# Patient Record
Sex: Female | Born: 1985 | Race: White | Hispanic: No | Marital: Married | Smoking: Current every day smoker
Health system: Southern US, Community
[De-identification: ages and names within clinical notes are randomized; demographics above are authoritative.]

## PROBLEM LIST (undated history)

## (undated) ENCOUNTER — Inpatient Hospital Stay (HOSPITAL_COMMUNITY): Payer: Self-pay

## (undated) ENCOUNTER — Emergency Department (HOSPITAL_COMMUNITY): Admission: EM | Payer: Self-pay | Source: Home / Self Care

## (undated) DIAGNOSIS — A6000 Herpesviral infection of urogenital system, unspecified: Secondary | ICD-10-CM

## (undated) DIAGNOSIS — N760 Acute vaginitis: Secondary | ICD-10-CM

## (undated) DIAGNOSIS — B379 Candidiasis, unspecified: Secondary | ICD-10-CM

## (undated) DIAGNOSIS — B9689 Other specified bacterial agents as the cause of diseases classified elsewhere: Secondary | ICD-10-CM

## (undated) DIAGNOSIS — Z331 Pregnant state, incidental: Secondary | ICD-10-CM

## (undated) DIAGNOSIS — B977 Papillomavirus as the cause of diseases classified elsewhere: Secondary | ICD-10-CM

## (undated) HISTORY — DX: Other specified bacterial agents as the cause of diseases classified elsewhere: B96.89

## (undated) HISTORY — DX: Other specified bacterial agents as the cause of diseases classified elsewhere: N76.0

## (undated) HISTORY — PX: TUBAL LIGATION: SHX77

## (undated) HISTORY — DX: Candidiasis, unspecified: B37.9

## (undated) HISTORY — DX: Pregnant state, incidental: Z33.1

---
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
## 2004-03-18 ENCOUNTER — Emergency Department (HOSPITAL_COMMUNITY): Admission: EM | Admit: 2004-03-18 | Discharge: 2004-03-18 | Payer: Self-pay | Admitting: Emergency Medicine

## 2005-03-07 ENCOUNTER — Emergency Department (HOSPITAL_COMMUNITY): Admission: EM | Admit: 2005-03-07 | Discharge: 2005-03-07 | Payer: Self-pay | Admitting: Family Medicine

## 2006-07-03 ENCOUNTER — Emergency Department (HOSPITAL_COMMUNITY): Admission: EM | Admit: 2006-07-03 | Discharge: 2006-07-03 | Payer: Self-pay | Admitting: Emergency Medicine

## 2006-08-21 ENCOUNTER — Inpatient Hospital Stay (HOSPITAL_COMMUNITY): Admission: AD | Admit: 2006-08-21 | Discharge: 2006-08-21 | Payer: Self-pay | Admitting: Obstetrics & Gynecology

## 2006-10-14 ENCOUNTER — Emergency Department (HOSPITAL_COMMUNITY): Admission: EM | Admit: 2006-10-14 | Discharge: 2006-10-14 | Payer: Self-pay | Admitting: Emergency Medicine

## 2006-10-14 ENCOUNTER — Inpatient Hospital Stay (HOSPITAL_COMMUNITY): Admission: AD | Admit: 2006-10-14 | Discharge: 2006-10-14 | Payer: Self-pay | Admitting: Obstetrics and Gynecology

## 2006-10-27 ENCOUNTER — Emergency Department (HOSPITAL_COMMUNITY): Admission: EM | Admit: 2006-10-27 | Discharge: 2006-10-27 | Payer: Self-pay | Admitting: Family Medicine

## 2006-12-15 ENCOUNTER — Inpatient Hospital Stay (HOSPITAL_COMMUNITY): Admission: AD | Admit: 2006-12-15 | Discharge: 2006-12-15 | Payer: Self-pay | Admitting: Obstetrics and Gynecology

## 2007-01-08 ENCOUNTER — Inpatient Hospital Stay (HOSPITAL_COMMUNITY): Admission: AD | Admit: 2007-01-08 | Discharge: 2007-01-08 | Payer: Self-pay | Admitting: Obstetrics and Gynecology

## 2007-03-25 ENCOUNTER — Inpatient Hospital Stay (HOSPITAL_COMMUNITY): Admission: AD | Admit: 2007-03-25 | Discharge: 2007-03-28 | Payer: Self-pay | Admitting: Obstetrics and Gynecology

## 2007-03-25 ENCOUNTER — Encounter (INDEPENDENT_AMBULATORY_CARE_PROVIDER_SITE_OTHER): Payer: Self-pay | Admitting: Obstetrics and Gynecology

## 2007-04-28 ENCOUNTER — Inpatient Hospital Stay (HOSPITAL_COMMUNITY): Admission: AD | Admit: 2007-04-28 | Discharge: 2007-04-28 | Payer: Self-pay | Admitting: Obstetrics and Gynecology

## 2007-08-20 HISTORY — PX: TOOTH EXTRACTION: SUR596

## 2007-09-01 ENCOUNTER — Emergency Department (HOSPITAL_COMMUNITY): Admission: EM | Admit: 2007-09-01 | Discharge: 2007-09-01 | Payer: Self-pay | Admitting: Family Medicine

## 2007-10-03 ENCOUNTER — Emergency Department (HOSPITAL_COMMUNITY): Admission: EM | Admit: 2007-10-03 | Discharge: 2007-10-03 | Payer: Self-pay | Admitting: Emergency Medicine

## 2007-11-18 ENCOUNTER — Emergency Department (HOSPITAL_COMMUNITY): Admission: EM | Admit: 2007-11-18 | Discharge: 2007-11-18 | Payer: Self-pay | Admitting: Emergency Medicine

## 2008-11-24 ENCOUNTER — Emergency Department (HOSPITAL_COMMUNITY): Admission: EM | Admit: 2008-11-24 | Discharge: 2008-11-24 | Payer: Self-pay | Admitting: Emergency Medicine

## 2009-01-28 ENCOUNTER — Emergency Department (HOSPITAL_COMMUNITY): Admission: EM | Admit: 2009-01-28 | Discharge: 2009-01-28 | Payer: Self-pay | Admitting: Emergency Medicine

## 2009-03-22 ENCOUNTER — Emergency Department (HOSPITAL_COMMUNITY): Admission: EM | Admit: 2009-03-22 | Discharge: 2009-03-22 | Payer: Self-pay | Admitting: Emergency Medicine

## 2010-01-19 ENCOUNTER — Emergency Department (HOSPITAL_COMMUNITY): Admission: EM | Admit: 2010-01-19 | Discharge: 2010-01-19 | Payer: Self-pay | Admitting: Emergency Medicine

## 2010-01-31 ENCOUNTER — Emergency Department (HOSPITAL_COMMUNITY): Admission: EM | Admit: 2010-01-31 | Discharge: 2010-01-31 | Payer: Self-pay | Admitting: Emergency Medicine

## 2010-02-17 ENCOUNTER — Emergency Department (HOSPITAL_COMMUNITY): Admission: EM | Admit: 2010-02-17 | Discharge: 2010-02-17 | Payer: Self-pay | Admitting: Emergency Medicine

## 2010-04-23 ENCOUNTER — Emergency Department (HOSPITAL_COMMUNITY): Admission: EM | Admit: 2010-04-23 | Discharge: 2010-04-23 | Payer: Self-pay | Admitting: Emergency Medicine

## 2010-07-18 ENCOUNTER — Emergency Department (HOSPITAL_COMMUNITY)
Admission: EM | Admit: 2010-07-18 | Discharge: 2010-07-18 | Payer: Self-pay | Source: Home / Self Care | Admitting: Emergency Medicine

## 2010-07-20 ENCOUNTER — Emergency Department (HOSPITAL_COMMUNITY)
Admission: EM | Admit: 2010-07-20 | Discharge: 2010-07-21 | Payer: Self-pay | Source: Home / Self Care | Admitting: Emergency Medicine

## 2010-08-30 ENCOUNTER — Emergency Department (HOSPITAL_COMMUNITY)
Admission: EM | Admit: 2010-08-30 | Discharge: 2010-08-30 | Payer: Self-pay | Source: Home / Self Care | Admitting: Emergency Medicine

## 2010-10-12 ENCOUNTER — Emergency Department (HOSPITAL_COMMUNITY)
Admission: EM | Admit: 2010-10-12 | Discharge: 2010-10-13 | Disposition: A | Discharge status: Home / Self Care | Payer: Self-pay | Attending: Emergency Medicine | Admitting: Emergency Medicine

## 2010-10-12 DIAGNOSIS — B9689 Other specified bacterial agents as the cause of diseases classified elsewhere: Secondary | ICD-10-CM | POA: Insufficient documentation

## 2010-10-12 DIAGNOSIS — A499 Bacterial infection, unspecified: Secondary | ICD-10-CM | POA: Insufficient documentation

## 2010-10-12 DIAGNOSIS — N760 Acute vaginitis: Secondary | ICD-10-CM | POA: Insufficient documentation

## 2010-10-12 LAB — URINE MICROSCOPIC-ADD ON

## 2010-10-12 LAB — URINALYSIS, ROUTINE W REFLEX MICROSCOPIC
Bilirubin Urine: NEGATIVE
Specific Gravity, Urine: 1.025 (ref 1.005–1.030)
Urine Glucose, Fasting: NEGATIVE mg/dL
Urobilinogen, UA: 0.2 mg/dL (ref 0.0–1.0)

## 2010-10-12 LAB — WET PREP, GENITAL
Trich, Wet Prep: NONE SEEN
Yeast Wet Prep HPF POC: NONE SEEN

## 2010-10-15 LAB — GC/CHLAMYDIA PROBE AMP, GENITAL: GC Probe Amp, Genital: NEGATIVE

## 2010-10-29 LAB — URINALYSIS, ROUTINE W REFLEX MICROSCOPIC
Bilirubin Urine: NEGATIVE
Glucose, UA: NEGATIVE mg/dL
Hgb urine dipstick: NEGATIVE
Specific Gravity, Urine: 1.011 (ref 1.005–1.030)
Urobilinogen, UA: 0.2 mg/dL (ref 0.0–1.0)
pH: 7.5 (ref 5.0–8.0)

## 2010-10-29 LAB — POCT PREGNANCY, URINE: Preg Test, Ur: NEGATIVE

## 2010-10-30 LAB — URINALYSIS, ROUTINE W REFLEX MICROSCOPIC
Bilirubin Urine: NEGATIVE
Glucose, UA: NEGATIVE mg/dL
Ketones, ur: NEGATIVE mg/dL
Nitrite: NEGATIVE
Protein, ur: NEGATIVE mg/dL
Specific Gravity, Urine: 1.015 (ref 1.005–1.030)
Urobilinogen, UA: 0.2 mg/dL (ref 0.0–1.0)
pH: 8 (ref 5.0–8.0)

## 2010-10-30 LAB — WET PREP, GENITAL
Clue Cells Wet Prep HPF POC: NONE SEEN
Trich, Wet Prep: NONE SEEN
Yeast Wet Prep HPF POC: NONE SEEN

## 2010-10-30 LAB — URINE MICROSCOPIC-ADD ON

## 2010-10-30 LAB — URINE CULTURE: Colony Count: >=100000

## 2010-11-01 LAB — URINALYSIS, ROUTINE W REFLEX MICROSCOPIC
Bilirubin Urine: NEGATIVE
Glucose, UA: NEGATIVE mg/dL
Protein, ur: NEGATIVE mg/dL
Specific Gravity, Urine: >1.03 — ABNORMAL HIGH (ref 1.005–1.030)
Urobilinogen, UA: 0.2 mg/dL (ref 0.0–1.0)

## 2010-11-01 LAB — POCT PREGNANCY, URINE: Preg Test, Ur: NEGATIVE

## 2010-11-01 LAB — URINE MICROSCOPIC-ADD ON

## 2010-11-01 LAB — WET PREP, GENITAL: Yeast Wet Prep HPF POC: NONE SEEN

## 2010-11-01 LAB — GC/CHLAMYDIA PROBE AMP, GENITAL: GC Probe Amp, Genital: NEGATIVE

## 2010-11-28 LAB — RAPID STREP SCREEN (MED CTR MEBANE ONLY): Streptococcus, Group A Screen (Direct): NEGATIVE

## 2010-12-01 ENCOUNTER — Emergency Department (HOSPITAL_COMMUNITY)
Admission: EM | Admit: 2010-12-01 | Discharge: 2010-12-01 | Disposition: A | Discharge status: Home / Self Care | Payer: Self-pay | Attending: Emergency Medicine | Admitting: Emergency Medicine

## 2010-12-01 DIAGNOSIS — R300 Dysuria: Secondary | ICD-10-CM | POA: Insufficient documentation

## 2010-12-01 LAB — URINALYSIS, ROUTINE W REFLEX MICROSCOPIC
Ketones, ur: 15 mg/dL — AB
Leukocytes, UA: NEGATIVE
Nitrite: NEGATIVE
Protein, ur: NEGATIVE mg/dL
Urobilinogen, UA: 0.2 mg/dL (ref 0.0–1.0)

## 2010-12-01 LAB — POCT PREGNANCY, URINE: Preg Test, Ur: NEGATIVE

## 2010-12-01 LAB — URINE MICROSCOPIC-ADD ON

## 2011-01-01 NOTE — Discharge Summary (Signed)
Sophia Garcia               ACCOUNT NO.:  [id number]   MEDICAL RECORD NO.:  [id number]          PATIENT TYPE:  INP   LOCATION:  9131                          FACILITY:  [facility]   PHYSICIAN:  Hal Morales, M.D.DATE OF BIRTH:  [birth year]   DATE OF ADMISSION:  03/25/2007  DATE OF DISCHARGE:  03/28/2007                               DISCHARGE SUMMARY   ADMISSION DIAGNOSES:  1. Intrauterine pregnancy at 38-4/7 weeks.  2. Early labor with positive rupture of membranes.  3. Negative Group B Strep.   DISCHARGE DIAGNOSES:  1. Intrauterine pregnancy at term.  2. Nonreassuring fetal heart rate.   PROCEDURES:  1. Primary low transverse cesarean section.  2. Epidural anesthesia.   HOSPITAL COURSE:  [name] is a 25 year old gravida 1, para 0, at 74-  4/7 weeks who presented with rupture of membranes at approximately 2:00  a.m. on the morning of March 25, 2007, and irregular uterine  contractions were noted.  Her pregnancy had been remarkable for:  1. Group B strep negative.  2. Latex allergy with no anaphylaxis.  3. Smoker.  4. First trimester marijuana use.  5. Late care.  6. Questionable nervous ticks.   HOSPITAL COURSE:  On admission cervix was fingertip, 50%, vertex at  minus two station.  She was leaking clear fluid. She was having  contractions every 5-6 minutes.  She was admitted here and contractions  began to be somewhat irregular. By the morning of August 6, she had been  started on Pitocin for minimal contraction change. Approximately 2:00  p.m. she was 2 cm, 90%, vertex at minus one station.  Fetal heart rate  was overall reassuring.  There were some mild variables noted.  Epidural  was placed.  By 5 p.m. the patient was having more pain with  contractions.  She had a deceleration down to 70s for 6 minutes with  recovery to 140 and then decelerations again to 115, repetitive late  decelerations occurred subsequent to that. There was positive short term  variability noted throughout. The cervix was 4, 90% vertex at -1. Dr.  Normand Sloop was caring for the patient at that time and despite all usual  measures the patient continued to have decelerations. C-section was  recommended and the patient family were in agreement.   The patient was taken to the operating room where a primary low  transverse cesarean section was performed for a viable female weight 5  pounds 10 ounces.  Apgars were 9 and 10.  Arterial and venous pH were  within normal limits.  The patient was taken to recovery in good  condition.  Infant was taken to the full-term nursery.  By postop day #1  patient is doing well.  Hemoglobin was 10.3, white blood cell count  11.9, platelet count 254.  She was on a PCA, this was discontinued that  day and Foley was also discontinued.  She did have some rhonchi noted in  her lungs due to status as a smoker.  She was encouraged to ambulate. By  the next day lungs were clear. Fundus was firm.  Incision  was clean, dry  and intact.  The patient was bottle feeding. Rest of hospital course was  uncomplicated.  By postop day #3 she was doing well.  She was requesting  discharge.  She was planning [place] for birth control.  Her physical  exam was within normal limits.  Her incision was clean, dry and intact  with Steri-Strips in place.  She was deemed to receive full benefit of  hospital stay and was discharged home.   DISCHARGE CONDITION:  Stable.   DISCHARGE INSTRUCTIONS:  Per [facility] handout.   DISCHARGE MEDICATIONS:  1. Motrin 600 mg p.o. q.6 hours p.r.n. pain.  2. Percocet one to two p.o. q.3-4 hours p.r.n. pain.   Discharge follow-up will occur in 5 weeks with plans made for cervical  cultures in preparation for [facility] IUD at the appropriate time.      Renaldo Reel Emilee Hero, C.N.M.      Hal Morales, M.D.  Electronically Signed    VLL/MEDQ  D:  03/28/2007  T:  03/29/2007  Job:  [id number]

## 2011-01-01 NOTE — Op Note (Signed)
NAME:  Sophia Garcia, Sophia Garcia               ACCOUNT NO.:  [id number]   MEDICAL RECORD NO.:  [id number]          PATIENT TYPE:  INP   LOCATION:  9131                          FACILITY:  [facility]   PHYSICIAN:  Naima A. Dillard, M.D. DATE OF BIRTH:  [birth year]   DATE OF PROCEDURE:  03/25/2007  DATE OF DISCHARGE:                               OPERATIVE REPORT   PREOPERATIVE DIAGNOSIS:  Pregnancy at term with nonreassuring fetal  heart tones.   POSTOPERATIVE DIAGNOSIS:  Pregnancy at term with nonreassuring fetal  heart tones.   PROCEDURE:  Primary low transverse cesarean section.   ESTIMATED BLOOD LOSS:  300 mL.   URINE OUTPUT:  300 mL.   IV FLUIDS:  1700 mL.   SURGEON:  Naima A. Normand Sloop, M.D.   ANESTHESIA:  Epidural.   Placenta was sent to pathology.   FINDINGS:  Female infant in vertex presentation with Apgars of 9 and 10,  with a weight of 5 pounds 10 ounces, and a venous pH of 7.36, arterial  pH of 7.37.  The patient had normal-appearing tubes and ovaries  bilaterally.  The patient went to recovery room in stable condition.   PROCEDURE IN DETAIL:  The patient was taken to the operating room where  epidural anesthesia was found to be adequate.  She was placed in dorsal  supine position with a left lateral tilt.  A Pfannenstiel skin incision  was made with the knife and carried down to the fascia.  The fascia was  incised in the midline, extended bilaterally.  Kocher x2 were placed on  the superior aspect of the fascia, which was dissected off the rectus  muscle both sharply and bluntly.  The inferior aspect of the fascia was  dissected in a similar fashion.  The rectus muscle was separated in  midline.  The peritoneum was identified and entered bluntly.  Bladder  blade was inserted.  Vesicouterine peritoneum was identified, tented up,  entered sharply and extended bilaterally.  Bladder blade was reinserted.  A primary low transverse uterine incision was made with the scalpel and  extended bluntly.  The infant was delivered without difficulty.  There  was clear fluid, no nuchal cord, no meconium.  Body was delivered  without difficulty.  Mouth and nares were suctioned.  Cord was clamped  and cut.  Arterial and venous blood gases were obtained.  Placenta was  manually delivered.  The uterus was cleared of all clot and debris.  Uterine incision was repaired with 0 Vicryl in a running locked fashion.  A second layer of 0 Vicryl was used to imbricate the uterus.  Irrigation  was done.  Hemostasis was assured.  The patient had normal-appearing  tubes and ovaries and abdominal anatomy.  All instruments were removed  from the abdomen.  The peritoneum was closed with 0 chromic.  The  muscles were irrigated and noted to be hemostatic.  The fascia was  closed with 0 Vicryl in a running fashion.  Subcutaneous tissue  was made hemostatic in any bleeding areas and then reapproximated with 2-  0 plain.  Skin was closed with  3-0 Monocryl in a subcuticular fashion.  Sponge, lap and needle counts were correct.  The patient went to the  recovery room in stable condition.      Naima A. Normand Sloop, M.D.  Electronically Signed     NAD/MEDQ  D:  03/25/2007  T:  03/26/2007  Job:  [id number]

## 2011-01-01 NOTE — H&P (Addendum)
NAMESHAGUANA, Sophia Garcia               ACCOUNT NO.:  [id number]   MEDICAL RECORD NO.:  [id number]          PATIENT TYPE:  MAT   LOCATION:  MATC                          [facility]:  [facility]   PHYSICIAN:  Hal Morales, M.D.DATE OF BIRTH:  10-Oct-1985   DATE OF ADMISSION:  03/25/2007  DATE OF DISCHARGE:                              HISTORY & PHYSICAL   Mrs. Sophia Garcia is a 25 year old, gravida 1, para 0, at 38-4/7 weeks who  presented complaining of spontaneous rupture of membranes at  approximately 2 a.m.  Clear fluid noted and a irregular contractions.  Group B strep culture was negative.   PREGNANCY ____ QA MARKER: 31 ____:       1. Latex allergy.   Dictation stopped here.      Renaldo Reel Emilee Hero, C.N.M.      Hal Morales, M.D.  Electronically Signed    VLL/MEDQ  D:  03/25/2007  T:  03/25/2007  Job:  [id number]
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
## 2011-01-04 NOTE — H&P (Signed)
NAME:  Sophia Garcia, Sophia Garcia                         ACCOUNT NO.:  [id number]   MEDICAL RECORD NO.:  [id number]                   PATIENT TYPE:  AMB   LOCATION:  DAY                                  FACILITY:  [facility]   PHYSICIAN:  J. Darreld Mclean, M.D.              DATE OF BIRTH:  09-15-1985   DATE OF ADMISSION:  DATE OF DISCHARGE:                                HISTORY & PHYSICAL   CHIEF COMPLAINT:  I got a needle in my foot.   HISTORY OF PRESENT ILLNESS:  The patient is a 25 year old female who broke a  sewing needle off between her first and second toes of the left foot,  approximately two weeks ago.  She was seen in the emergency room and then  seen in our office for the first time on August 25th.  At that time, she was  about six days from her injury.  She said she felt the needle in it when she  moved her foot.  X-rays at the hospital at [facility] taken on the 24th,  showed a needle in her foot, in the webspace, between the second toe and the  great toe area.  She had some marked erythema to the foot.  We put her on IV  Rocephin until Tuesday.  We saw her Wednesday.  We asked her to come back on  Friday.  We saw her back on Friday the 27th.  At that time, the erythema was  down significantly.  She still continued on the IV Rocephin.  Originally  told her we would have to wait to make sure the cellulitis cleared up before  we could do the surgery and now we plan to do the surgery on Tuesday, 31st  of August.  The patient's white count was only 10,700, with 71 neutrophils  when she came in.  She has been using crutches, staying off of it and doing  well.   PAST MEDICAL HISTORY:  Negative.  No heart disease, lung disease, kidney  disease, stroke, paralysis, weakness, hypertension, diabetes, TB or  rheumatic fever, cancer, ulcers, circulatory problems.   ALLERGIES:  Denies any allergies.   MEDICATIONS:  Hydrocodone, ibuprofen, Rocephin, Ancef.   SOCIAL HISTORY:  She does smoke.   She does not use alcoholic beverage.  She  has finished school through the 9th grade.  She does not have a family  doctor.   PAST SURGICAL HISTORY:  She did have sutures in her arm last year, but no  major surgeries.  The injury happened in her home.   PHYSICAL EXAMINATION:  VITAL SIGNS:  Within normal limits.  HEENT:  Negative.  NECK:  Supple.  LUNGS:  Clear to P&A.  HEART:  Regular without murmur.  ABDOMEN:  Soft, tender, without masses.  EXTREMITIES:  Negative except for the foot on the left.  There is some  swelling there but no further erythema.  There  is evidence of a small  puncture wound at the bottom of the foot in the webspace between the second  and first metatarsals.  Neurovascular intact.  Neurologically intact.  Other  extremities within normal limits.  CNS:  Intact.  SKIN:  Intact.   IMPRESSION:  Foreign body left foot, distally in the webspace area between  the first and second metatarsals.  History of recent cellulitis.   PLAN:  Excision of foreign body.  Will do this under C-arm fluoroscopy.  We  may come from the top on the dorsum of the foot.  Depends on how it looks  and it is going to be an incision approximately an inch and a half to two  inches to find this.  Risks and problems were discussed.  She will need to  continue the antibiotics until she completes a ten day course.  She and her  family appeared to understand the procedure as outlined.   LABORATORY DATA:  Pending.                                                 Teola Bradley, M.D.    JWK/MEDQ  D:  04/18/2003  T:  04/18/2003  Job:  [id number]

## 2011-01-04 NOTE — Op Note (Signed)
NAME:  Sophia Garcia, Sophia Garcia                         ACCOUNT NO.:  [id number]   MEDICAL RECORD NO.:  [id number]                   PATIENT TYPE:  AMB   LOCATION:  DAY                                  FACILITY:  [facility]   PHYSICIAN:  J. Darreld Mclean, M.D.              DATE OF BIRTH:  14-May-1986   DATE OF PROCEDURE:  DATE OF DISCHARGE:                                 OPERATIVE REPORT   PREOPERATIVE DIAGNOSIS:  Foreign body (sewing needle) between the first and  second toe webspace, left foot, deep.   POSTOPERATIVE DIAGNOSIS:  Foreign body (sewing needle) between the first and  second toe webspace, left foot, deep.   PROCEDURE:  Removal of deep foreign body, left foot, between the first and  second toe webspace at first and second metatarsals.   ANESTHESIA:  General.   SURGEON:  J. Darreld Mclean, M.D.   ASSISTANT:  Candace Cruise, P.A.C.   TOURNIQUET TIME:  Please refer to anesthesia record.   DRAINS:  None.   DISPOSITION:  The patient went to recovery in good condition.   INDICATIONS FOR PROCEDURE:  The patient is a 25 year old who approximately  two weeks ago stuck a needle into her foot.  It broke off.  She delayed  going to the emergency room for five to six days.  When she got to the  emergency room, she already had some erythema.  She had cellulitis.  She was  begun on IV Rocephin.  I saw her in the office on the following day.  We  continued the IV antibiotics until the foot cleared up.  She is now in for  removal of the foreign body, the needle.  The risks and imponderables have  been discussed.  The family and the patient appear to understand and agree  to the procedure as outlined.   DESCRIPTION OF PROCEDURE:  The patient was placed supine on the operating  room table and general anesthesia was given.  A tourniquet was placed  deflated on the left upper thigh.  The patient's was prepped and draped in  the usual manner.  We reascertained that we were doing [name] and  doing  for a foreign body.  The left foot was elevated and wrapped  circumferentially with an Esmarch bandage.  The tourniquet was inflated to  250 mmHg.  The Esmarch bandage was removed.   The C-arm fluoroscopy unit was brought forward, and using three different  size gauge needles, these were placed in the area where we thought that the  foreign body was.  We were unable to triangulate and ascertain where this  was in the depth.  I decided to make an anterior approach.  An anterior  approach was made between the webspace between the first and second toe.  Careful dissection of the neurovascular bundles were identified and  protected.  We brought the C-arm unit in further and placed the  needles  again.  We were able to actually palpate the needle.  With gentle  dissection, the needle was identified deep, brought forward, and brought  out.  An x-ray was taken proving that the needle was removed, and there was  no evidence of any retained metal.  The area was irrigated copiously with  saline and then the wound reapproximated using 2-0 plain deep and  interrupted vertical mattress 3-0 nylon for the skin.  A sterile dressing  was applied.  A bulky dressing was applied.  The tourniquet was deflated.  Please see anesthesia record for tourniquet time.   The patient was given a prescription for Vicodin-ES for pain.  She has  crutches.  She is to stay off of it, elevate it.  I will see her in the  office in approximately 10 days to 2 weeks.  Any difficulties, she is to  contact me through the office or hospital beeper system.                                               Teola Bradley, M.D.    JWK/MEDQ  D:  04/19/2003  T:  04/19/2003  Job:  [id number]

## 2011-02-21 ENCOUNTER — Emergency Department (HOSPITAL_COMMUNITY): Payer: Self-pay

## 2011-02-21 ENCOUNTER — Emergency Department (HOSPITAL_COMMUNITY)
Admission: EM | Admit: 2011-02-21 | Discharge: 2011-02-21 | Disposition: A | Discharge status: Home / Self Care | Payer: Self-pay | Attending: Emergency Medicine | Admitting: Emergency Medicine

## 2011-02-21 DIAGNOSIS — S6390XA Sprain of unspecified part of unspecified wrist and hand, initial encounter: Secondary | ICD-10-CM | POA: Insufficient documentation

## 2011-02-21 DIAGNOSIS — F17200 Nicotine dependence, unspecified, uncomplicated: Secondary | ICD-10-CM | POA: Insufficient documentation

## 2011-02-21 DIAGNOSIS — Y92009 Unspecified place in unspecified non-institutional (private) residence as the place of occurrence of the external cause: Secondary | ICD-10-CM | POA: Insufficient documentation

## 2011-02-21 DIAGNOSIS — R296 Repeated falls: Secondary | ICD-10-CM | POA: Insufficient documentation

## 2011-05-10 LAB — POCT URINALYSIS DIP (DEVICE)
Nitrite: NEGATIVE
Protein, ur: 100 — AB
Urobilinogen, UA: 1
pH: 6

## 2011-05-10 LAB — I-STAT 8, (EC8 V) (CONVERTED LAB)
Acid-base deficit: 1
Bicarbonate: 21.9
Glucose, Bld: 98
Potassium: 3.3 — ABNORMAL LOW
TCO2: 23
pCO2, Ven: 29.3 — ABNORMAL LOW
pH, Ven: 7.481 — ABNORMAL HIGH

## 2011-05-10 LAB — POCT PREGNANCY, URINE
Operator id: 239701
Preg Test, Ur: NEGATIVE

## 2011-05-14 LAB — WET PREP, GENITAL
Clue Cells Wet Prep HPF POC: NONE SEEN
Trich, Wet Prep: NONE SEEN
WBC, Wet Prep HPF POC: NONE SEEN
Yeast Wet Prep HPF POC: NONE SEEN

## 2011-05-14 LAB — POCT URINALYSIS DIP (DEVICE)
Bilirubin Urine: NEGATIVE
Hgb urine dipstick: NEGATIVE
Nitrite: NEGATIVE
Protein, ur: NEGATIVE
pH: 8.5 — ABNORMAL HIGH

## 2011-05-14 LAB — POCT PREGNANCY, URINE
Operator id: 247071
Preg Test, Ur: NEGATIVE

## 2011-06-03 LAB — CBC
Hemoglobin: 11.9 — ABNORMAL LOW
MCHC: 33.6
MCHC: 34.3
MCV: 101.5 — ABNORMAL HIGH
Platelets: 254
Platelets: 374
RBC: 2.96 — ABNORMAL LOW
RDW: 14.4 — ABNORMAL HIGH
WBC: 11.9 — ABNORMAL HIGH

## 2011-10-24 ENCOUNTER — Inpatient Hospital Stay (HOSPITAL_COMMUNITY)
Admission: AD | Admit: 2011-10-24 | Discharge: 2011-10-24 | Disposition: A | Discharge status: Home / Self Care | Payer: Self-pay | Source: Ambulatory Visit | Attending: Obstetrics & Gynecology | Admitting: Obstetrics & Gynecology

## 2011-10-24 ENCOUNTER — Encounter (HOSPITAL_COMMUNITY): Payer: Self-pay | Admitting: *Deleted

## 2011-10-24 DIAGNOSIS — Z3201 Encounter for pregnancy test, result positive: Secondary | ICD-10-CM | POA: Insufficient documentation

## 2011-10-24 LAB — POCT PREGNANCY, URINE: Preg Test, Ur: POSITIVE — AB

## 2011-10-24 NOTE — ED Notes (Signed)
Pt needs a verification letter to apply for pregancy medicaid; wishes to get a rx for nause;

## 2011-10-24 NOTE — Progress Notes (Signed)
Patient states she has done two home pregnancy tests that are positive. Needs a proof of pregnancy letter to apply for medicaid. Patient states she has nausea but has only vomited once. No pain or bleeding.

## 2011-10-24 NOTE — ED Provider Notes (Signed)
Sophia Garcia is a 26 y.o. female who presents to [facility] for pregnancy verification. She has had a little nausea in the am but denies any other problems.  ROS: As stated above.  PE: alert and oriented, no acute distress. Vital signs reviewed and are normal.   Results for orders placed during the hospital encounter of 10/24/11 (from the past 24 hour(s))  POCT PREGNANCY, URINE     Status: Abnormal   Collection Time   10/24/11 12:14 PM      Component Value Range   Preg Test, Ur POSITIVE (*) NEGATIVE     [place], NP 10/24/11 1313

## 2011-10-24 NOTE — Discharge Instructions (Signed)
    ________________________________________     To schedule your Maternity Eligibility Appointment, please call [phone].  When you arrive for your appointment you must bring the following items or information listed below.  Your appointment will be rescheduled if you do not have these items or are 15 minutes late. If currently receiving Medicaid, you MUST bring: 1. Medicaid Card 2. Social Security Card 3. Picture ID 4. Proof of Pregnancy 5. Verification of current address if the address on Medicaid card is incorrect "postmarked mail" If not receiving Medicaid, you MUST bring: 1. Social Security Card 2. Picture ID 3. Birth Certificate (if available) Passport or *Green Card 4. Proof of Pregnancy 5. Verification of current address "postmarked mail" for each income presented. 6. Verification of insurance coverage, if any 7. Check stubs from each employer for the previous month (if unable to present check stub  for each week, we will accept check stub for the first and last week ill the same month.) If you can't locate check stubs, you must bring a letter from the employer(s) and it must have the following information on letterhead, typed, in English: o name of company o company telephone number o how long been with the company, if less than one month o how much person earns per hour o how many hours per week work o the gross pay the person earned for the previous month If you are 27 years old or less, you do not have to bring proof of income unless you work or live with the father of the baby and at that time we will need proof of income from you and/or the father of the baby. Green Card recipients are eligible for Medicaid for Pregnant Women (MPW)    

## 2011-11-05 ENCOUNTER — Encounter (HOSPITAL_COMMUNITY): Payer: Self-pay | Admitting: *Deleted

## 2011-11-05 ENCOUNTER — Inpatient Hospital Stay (HOSPITAL_COMMUNITY)
Admission: AD | Admit: 2011-11-05 | Discharge: 2011-11-05 | Disposition: A | Discharge status: Home / Self Care | Payer: Medicaid Other | Source: Ambulatory Visit | Attending: Obstetrics & Gynecology | Admitting: Obstetrics & Gynecology

## 2011-11-05 DIAGNOSIS — O219 Vomiting of pregnancy, unspecified: Secondary | ICD-10-CM

## 2011-11-05 DIAGNOSIS — O210 Mild hyperemesis gravidarum: Secondary | ICD-10-CM | POA: Insufficient documentation

## 2011-11-05 LAB — URINALYSIS, ROUTINE W REFLEX MICROSCOPIC
Bilirubin Urine: NEGATIVE
Ketones, ur: NEGATIVE mg/dL
Leukocytes, UA: NEGATIVE
Nitrite: NEGATIVE
Urobilinogen, UA: 0.2 mg/dL (ref 0.0–1.0)

## 2011-11-05 MED ORDER — ONDANSETRON 8 MG PO TBDP
8.0000 mg | ORAL_TABLET | Freq: Once | ORAL | Status: AC
  Administered 2011-11-05: 8 mg via ORAL
  Filled 2011-11-05: qty 1

## 2011-11-05 MED ORDER — ONDANSETRON 8 MG PO TBDP: 8.0000 mg | ORAL_TABLET | Freq: Once | ORAL | Status: AC

## 2011-11-05 MED ORDER — FAMOTIDINE 20 MG PO TABS: 20.0000 mg | ORAL_TABLET | Freq: Two times a day (BID) | ORAL | Status: DC

## 2011-11-05 NOTE — Discharge Instructions (Signed)
Morning Sickness Morning sickness is when you feel sick to your stomach (nauseous) during pregnancy. You may feel sick to your stomach and throw up (vomit). You may feel sick in the morning, but you can feel this way any time of day. Some women feel very sick to their stomach and cannot stop throwing up (hyperemesis gravidarum). HOME CARE  Take multivitamins as told by your doctor. Taking multivitamins before getting pregnant can stop or lessen the harshness of morning sickness.   Eat dry toast or unsalted crackers before getting out of bed.   Eat 5 to 6 small meals a day.   Eat dry and bland foods like rice and baked potatoes.   Do not drink liquids with meals. Drink between meals.   Do not eat greasy, fatty, or spicy foods.   Have someone cook for you if the smell of food causes you to feel sick or throw up.   Do not take vitamins with iron, or as told by your doctor.   Eat protein when you need a snack (nuts, yogurt, cheese).   Eat unsweetened gelatins for dessert.   Wear a bracelet used for sea sickness (acupressure wristband).   Go to a doctor that puts thin needles into certain body points (acupuncture) to improve how you feel.   Do not smoke.   Use a humidifier to keep the air in your house free of odors.  GET HELP RIGHT AWAY IF:   You feel very sick to your stomach and cannot stop throwing up.   You pass out (faint).   You have a fever.   You need medicine to feel better.   You feel dizzy or lightheaded.   You are losing weight.   You need help knowing what to eat and what not to eat.  MAKE SURE YOU:   Understand these instructions.   Will watch your condition.   Will get help right away if you are not doing well or get worse.  Document Released: 09/12/2004 Document Revised: 07/25/2011 Document Reviewed: 11/02/2009 [facility] Patient Information 2012 [place], [place].B.R.A.T. Diet Your doctor has recommended the B.R.A.T. diet for you or your child until the  condition improves. This is often used to help control diarrhea and vomiting symptoms. If you or your child can tolerate clear liquids, you may have:  Bananas.   Rice.   Applesauce.   Toast (and other simple starches such as crackers, potatoes, noodles).  Be sure to avoid dairy products, meats, and fatty foods until symptoms are better. Fruit juices such as apple, grape, and prune juice can make diarrhea worse. Avoid these. Continue this diet for 2 days or as instructed by your caregiver. Document Released: 08/05/2005 Document Revised: 07/25/2011 Document Reviewed: 01/22/2007 [facility] Patient Information 2012 [place], [place].

## 2011-11-05 NOTE — MAU Provider Note (Signed)
Chief Complaint:  Nausea and Diarrhea    None     Sophia Garcia is  26 y.o. G2P1001.  Patient's last menstrual period was 09/13/2011.Marland Kitchen  Her pregnancy status is positive.  [redacted]w[redacted]d by LMP. She presents complaining of Nausea and Diarrhea . Onset is described as gradual and has been present for  1 week. Reports mainly dry heaving. 3 episodes of loose stool early today. Tolerating water and a banana sandwich only today. Denies abd pain, bleeding, dysuria, or back pain.   Obstetrical/Gynecological History: OB History    Grav Para Term Preterm Abortions TAB SAB Ect Mult Living   2 1 1       1       Past Medical History: History reviewed. No pertinent past medical history.  Past Surgical History: Past Surgical History  Procedure Date  . Cesarean section   . Tooth extraction   . Foot surgery     Family History: Family History  Problem Relation Age of Onset  . Diabetes Father   . Cancer Paternal Aunt   . Diabetes Paternal Aunt   . Diabetes Paternal Uncle   . Cancer Maternal Grandmother     Social History: History  Substance Use Topics  . Smoking status: Current Everyday Smoker -- 0.2 packs/day  . Smokeless tobacco: Not on file  . Alcohol Use: No    Allergies:  Allergies  Allergen Reactions  . Morphine And Related Rash    Prescriptions prior to admission  Medication Sig Dispense Refill  . Prenatal Vit-Fe Fumarate-FA (PRENATAL MULTIVITAMIN) TABS Take 1 tablet by mouth daily.        Review of Systems - Negative except what has been reviewed in the HPI  Physical Exam   Blood pressure 111/57, pulse 79, temperature 98.2 F (36.8 C), temperature source Oral, resp. rate 18, last menstrual period 09/13/2011.  General: General appearance - alert, well appearing, and in no distress, oriented to person, place, and time and normal appearing weight Mental status - alert, oriented to person, place, and time, normal mood, behavior, speech, dress, motor activity, and thought  processes, affect appropriate to mood Abdomen - soft, nontender, nondistended, no masses or organomegaly Focused Gynecological Exam: normal external genitalia, vulva, vagina, cervix, uterus and adnexa, examination not indicated  Labs: Recent Results (from the past 24 hour(s))  URINALYSIS, ROUTINE W REFLEX MICROSCOPIC   Collection Time   11/05/11  6:05 PM      Component Value Range   Color, Urine YELLOW  YELLOW    APPearance CLEAR  CLEAR    Specific Gravity, Urine 1.025  1.005 - 1.030    pH 6.0  5.0 - 8.0    Glucose, UA NEGATIVE  NEGATIVE (mg/dL)   Hgb urine dipstick NEGATIVE  NEGATIVE    Bilirubin Urine NEGATIVE  NEGATIVE    Ketones, ur NEGATIVE  NEGATIVE (mg/dL)   Protein, ur NEGATIVE  NEGATIVE (mg/dL)   Urobilinogen, UA 0.2  0.0 - 1.0 (mg/dL)   Nitrite NEGATIVE  NEGATIVE    Leukocytes, UA NEGATIVE  NEGATIVE     Imaging Studies:  Informal bedside US showed viable IUP with CRL measuring [redacted]w[redacted]d with cardiac activity. Images archive. Picture given to patient   Assessment: Nausea and Vomiting of pregnancy  Plan: Discharge home Rx send to pharmacy for Zofran and Pepcid BRAT diet Norovirus precautions reviewed. FU with [facility] for new OB appt as scheduled.  Fredricka Kohrs E. 11/05/2011,6:43 PM

## 2011-11-05 NOTE — MAU Note (Signed)
First nausea and then pt states she started having diarrhea started at about 1130. Pt states she has has 3 episodes of diarrhea

## 2011-11-05 NOTE — Progress Notes (Signed)
heartrate visualized

## 2011-11-20 ENCOUNTER — Encounter (HOSPITAL_COMMUNITY): Payer: Self-pay | Admitting: *Deleted

## 2011-11-20 ENCOUNTER — Inpatient Hospital Stay (HOSPITAL_COMMUNITY)
Admission: AD | Admit: 2011-11-20 | Discharge: 2011-11-20 | Disposition: A | Discharge status: Home / Self Care | Payer: Medicaid Other | Source: Ambulatory Visit | Attending: Obstetrics and Gynecology | Admitting: Obstetrics and Gynecology

## 2011-11-20 DIAGNOSIS — O210 Mild hyperemesis gravidarum: Secondary | ICD-10-CM

## 2011-11-20 DIAGNOSIS — K920 Hematemesis: Secondary | ICD-10-CM

## 2011-11-20 LAB — URINALYSIS, ROUTINE W REFLEX MICROSCOPIC
Bilirubin Urine: NEGATIVE
Hgb urine dipstick: NEGATIVE
Ketones, ur: NEGATIVE mg/dL
Nitrite: NEGATIVE
Protein, ur: NEGATIVE mg/dL
Urobilinogen, UA: 0.2 mg/dL (ref 0.0–1.0)

## 2011-11-20 MED ORDER — METOCLOPRAMIDE HCL 10 MG PO TABS
10.0000 mg | ORAL_TABLET | Freq: Once | ORAL | Status: AC
  Administered 2011-11-20: 10 mg via ORAL
  Filled 2011-11-20: qty 1

## 2011-11-20 MED ORDER — PROMETHAZINE HCL 25 MG PO TABS: 25.0000 mg | ORAL_TABLET | Freq: Four times a day (QID) | ORAL | Status: DC | PRN

## 2011-11-20 NOTE — MAU Note (Signed)
Patient states she has been having nausea and vomiting for a while. Has been seen in MAU and given Zofran but is not working any longer. Had a little blood in the emesis this am. Denies any pain at this time.

## 2011-11-20 NOTE — Discharge Instructions (Signed)
Hyperemesis Gravidarum Hyperemesis gravidarum is a severe form of nausea and vomiting that happens during pregnancy. Hyperemesis is worse than morning sickness. It may cause a woman to have nausea or vomiting all day for many days. It may keep a woman from eating and drinking enough food and liquids. Hyperemesis usually occurs during the first half (the first 20 weeks) of pregnancy. It often goes away once a woman is in her second half of pregnancy. However, sometimes hyperemesis continues through an entire pregnancy.  CAUSES  The cause of this condition is not completely known but is thought to be due to changes in the body's hormones when pregnant. It could be the high level of the pregnancy hormone or an increase in estrogen in the body.  SYMPTOMS   Severe nausea and vomiting.   Nausea that does not go away.   Vomiting that does not allow you to keep any food down.   Weight loss and body fluid loss (dehydration).   Having no desire to eat or not liking food you have previously enjoyed.  DIAGNOSIS  Your caregiver may ask you about your symptoms. Your caregiver may also order blood tests and urine tests to make sure something else is not causing the problem.  TREATMENT  You may only need medicine to control the problem. If medicines do not control the nausea and vomiting, you will be treated in the hospital to prevent dehydration, acidosis, weight loss, and changes in the electrolytes in your body that may harm the unborn baby (fetus). You may need intravenous (IV) fluids.  HOME CARE INSTRUCTIONS   Take all medicine as directed by your caregiver.   Try eating a couple of dry crackers or toast in the morning before getting out of bed.   Avoid foods and smells that upset your stomach.   Avoid fatty and spicy foods. Eat 5 to 6 small meals a day.   Do not drink when eating meals. Drink between meals.   For snacks, eat high protein foods, such as cheese. Eat or suck on things that have  ginger in them. Ginger helps nausea.   Avoid food preparation. The smell of food can spoil your appetite.   Avoid iron pills and iron in your multivitamins until after 3 to 4 months of being pregnant.  SEEK MEDICAL CARE IF:   Your abdominal pain increases since the last time you saw your caregiver.   You have a severe headache.   You develop vision problems.   You feel you are losing weight.  SEEK IMMEDIATE MEDICAL CARE IF:   You are unable to keep fluids down.   You vomit blood.   You have constant nausea and vomiting.   You have a fever.   You have excessive weakness, dizziness, fainting, or extreme thirst.  MAKE SURE YOU:   Understand these instructions.   Will watch your condition.   Will get help right away if you are not doing well or get worse.   Keep appointment already scheduled Document Released: 08/05/2005 Document Revised: 07/25/2011 Document Reviewed: 11/05/2010 [facility] Patient Information 2012 [place], [place].

## 2011-11-20 NOTE — Progress Notes (Signed)
Spoke with Dr Su Hilt received order for medication for pt. Dr. Su Hilt also states she is coming to evaluate pt.

## 2011-11-20 NOTE — MAU Note (Signed)
Pt states she has NV and has been taken Zofran at night pt states she still gets up in the morning nauseous

## 2011-11-20 NOTE — MAU Provider Note (Addendum)
  History   25yo G2P1 at 9wks 5days who presents c/o episodes of dry heaving mostly but had an episode of emesis this morning with small amount of blood in it.  She also has this when blows her nose.  Otherwise she has no complaints and has a new ob w/u and interview scheduled in office on 12/03/11.  She has no nausea right now and mostly came in because the blood scared her.  Pt is s/p reglan x 1 in MAU.  She says phenergan worked well for her in prior pregnancy  CSN: [id number]  Arrival date and time: 11/20/11 1310   None     Chief Complaint  Patient presents with  . Emesis During Pregnancy   HPI  OB History    Grav Para Term Preterm Abortions TAB SAB Ect Mult Living   2 1 1       1       History reviewed. No pertinent past medical history.  Past Surgical History  Procedure Date  . Cesarean section   . Tooth extraction   . Foot surgery     Family History  Problem Relation Age of Onset  . Diabetes Father   . Cancer Paternal Aunt   . Diabetes Paternal Aunt   . Diabetes Paternal Uncle   . Cancer Maternal Grandmother     History  Substance Use Topics  . Smoking status: Current Everyday Smoker -- 0.2 packs/day  . Smokeless tobacco: Not on file  . Alcohol Use: No    Allergies:  Allergies  Allergen Reactions  . Morphine And Related Rash    Prescriptions prior to admission  Medication Sig Dispense Refill  . acetaminophen (TYLENOL) 325 MG tablet Take 325 mg by mouth daily as needed. For pain      . famotidine (PEPCID) 20 MG tablet Take 1 tablet (20 mg total) by mouth 2 (two) times daily.  60 tablet  1  . ondansetron (ZOFRAN-ODT) 8 MG disintegrating tablet Take 8 mg by mouth daily as needed. For nausea      . Prenatal Vit-Fe Fumarate-FA (PRENATAL MULTIVITAMIN) TABS Take 1 tablet by mouth daily.        ROS Noncontributory. Denies f/c.  Physical Exam   Blood pressure 110/60, pulse 84, temperature 97.2 F (36.2 C), temperature source Oral, resp. rate 16, height 4'  11" (1.499 m), weight 98 lb 3.2 oz (44.543 kg), last menstrual period 09/13/2011, SpO2 100.00%.  Physical Exam Alert RRR CTA bil Abd soft, NT Ext no calf tenderness  MAU Course  Procedures FHT visible on bedside ultrasound    Assessment and Plan  25yo G2P1 at 9wks 5days with small amount of hematemesis.  Seems to have resulted from a lot of dry heaving that precipitated episode.  Pt otherwise is doing well.  D/C instructions discussed and pt instructed to keep appt in office as scheduled on the 16th.  If there are any worsening symptoms, pt was instructed to call office.  Purcell Nails 11/20/2011, 4:16 PM

## 2011-12-03 ENCOUNTER — Ambulatory Visit (INDEPENDENT_AMBULATORY_CARE_PROVIDER_SITE_OTHER): Payer: Medicaid Other | Admitting: Obstetrics and Gynecology

## 2011-12-03 DIAGNOSIS — Z331 Pregnant state, incidental: Secondary | ICD-10-CM

## 2011-12-03 LAB — POCT URINALYSIS DIPSTICK: Spec Grav, UA: 1.015

## 2011-12-04 ENCOUNTER — Other Ambulatory Visit: Payer: Self-pay | Admitting: Obstetrics and Gynecology

## 2011-12-04 DIAGNOSIS — Z36 Encounter for antenatal screening of mother: Secondary | ICD-10-CM

## 2011-12-04 LAB — PRENATAL PANEL VII
Antibody Screen: NEGATIVE
Basophils Absolute: 0 10*3/uL (ref 0.0–0.1)
Basophils Relative: 0 % (ref 0–1)
Eosinophils Absolute: 0.1 10*3/uL (ref 0.0–0.7)
Eosinophils Relative: 1 % (ref 0–5)
Lymphs Abs: 1.8 10*3/uL (ref 0.7–4.0)
MCH: 32 pg (ref 26.0–34.0)
MCHC: 33.9 g/dL (ref 30.0–36.0)
MCV: 94.4 fL (ref 78.0–100.0)
Neutrophils Relative %: 78 % — ABNORMAL HIGH (ref 43–77)
Platelets: 431 10*3/uL — ABNORMAL HIGH (ref 150–400)
RDW: 14.8 % (ref 11.5–15.5)
Rubella: 31.8 IU/mL — ABNORMAL HIGH

## 2011-12-05 LAB — CULTURE, OB URINE
Colony Count: NO GROWTH
Organism ID, Bacteria: NO GROWTH

## 2011-12-09 ENCOUNTER — Ambulatory Visit (INDEPENDENT_AMBULATORY_CARE_PROVIDER_SITE_OTHER): Payer: Medicaid Other

## 2011-12-09 ENCOUNTER — Other Ambulatory Visit: Payer: Medicaid Other

## 2011-12-09 ENCOUNTER — Other Ambulatory Visit: Payer: Self-pay | Admitting: Obstetrics and Gynecology

## 2011-12-09 DIAGNOSIS — Z36 Encounter for antenatal screening of mother: Secondary | ICD-10-CM

## 2011-12-23 ENCOUNTER — Ambulatory Visit (INDEPENDENT_AMBULATORY_CARE_PROVIDER_SITE_OTHER): Payer: Medicaid Other | Admitting: Obstetrics and Gynecology

## 2011-12-23 ENCOUNTER — Encounter: Payer: Self-pay | Admitting: Obstetrics and Gynecology

## 2011-12-23 VITALS — BP 106/60 | Wt 100.0 lb

## 2011-12-23 DIAGNOSIS — F17200 Nicotine dependence, unspecified, uncomplicated: Secondary | ICD-10-CM

## 2011-12-23 DIAGNOSIS — O99320 Drug use complicating pregnancy, unspecified trimester: Secondary | ICD-10-CM | POA: Insufficient documentation

## 2011-12-23 DIAGNOSIS — Z331 Pregnant state, incidental: Secondary | ICD-10-CM

## 2011-12-23 DIAGNOSIS — Z720 Tobacco use: Secondary | ICD-10-CM | POA: Insufficient documentation

## 2011-12-23 DIAGNOSIS — F1910 Other psychoactive substance abuse, uncomplicated: Secondary | ICD-10-CM

## 2011-12-23 DIAGNOSIS — Z9889 Other specified postprocedural states: Secondary | ICD-10-CM

## 2011-12-23 DIAGNOSIS — IMO0002 Reserved for concepts with insufficient information to code with codable children: Secondary | ICD-10-CM

## 2011-12-23 DIAGNOSIS — Z98891 History of uterine scar from previous surgery: Secondary | ICD-10-CM | POA: Insufficient documentation

## 2011-12-23 DIAGNOSIS — R87629 Unspecified abnormal cytological findings in specimens from vagina: Secondary | ICD-10-CM

## 2011-12-23 HISTORY — DX: Pregnant state, incidental: Z33.1

## 2011-12-23 NOTE — Progress Notes (Signed)
Addended by: Einar Crow on: 12/23/2011 03:31 PM   Modules accepted: Orders

## 2011-12-23 NOTE — Progress Notes (Signed)
C/o right side abdominal pain

## 2011-12-23 NOTE — Progress Notes (Signed)
Patient ID: Sophia Garcia, female   DOB: May 30, 1986, 26 y.o.   MRN: [id number] Sophia Garcia is a 26 y.o. female presenting for NOB exam, taking phenergan and zofran  less N,V now, no constipation. Taking PNV. Discussed hx of C/S for NRFHR would consider TOLAC, using marijuana for N, smoking 1/2 pack daily. EDV per certain LMP and [place] 12 week agrees with EDC.  @IPILAPH @ OB History    Grav Para Term Preterm Abortions TAB SAB Ect Mult Living   2 1 1       1      Past Medical History  Diagnosis Date  . Yeast infection   . BV (bacterial vaginosis)   . Normal pregnancy, incidental 12/23/2011   Past Surgical History  Procedure Date  . Cesarean section   . Tooth extraction 2009    ONE TOOTH  . Foot surgery 2002    STEPPED ON SEWING NEEDLE   Family History: family history includes COPD in her mother and paternal uncle; Cancer in her maternal grandmother and paternal aunt; and Diabetes in her father, paternal aunt, paternal grandfather, and paternal uncle. Social History:  reports that she has been smoking Cigarettes.  She has a 3 pack-year smoking history. She has never used smokeless tobacco. She reports that she uses illicit drugs (Marijuana). She reports that she does not drink alcohol.    Blood pressure 106/60, weight 100 lb (45.36 kg), last menstrual period 09/13/2011. Physical exam: Calm, no distress, lungs clear bilaterally, AP RRR, abd soft,nt, FH 14 week size, bowel sounds active no  edema to lower extremities  Prenatal labs: ABO, Rh: O/POS/-- (04/16 1625) Antibody: NEG (04/16 1625) Rubella:  Immune RPR: NON REAC (04/16 1625)  HBsAg: NEGATIVE (04/16 1625)  HIV: NON REACTIVE (04/16 1625) :     Assessment/Plan: 14 week IUP Plan f/o anatomy and QUAd 4 weeks per pt request, discussed tobacco and marijuana use discontinuation. F/o RDS. Collaboration with Dr. Su Hilt. Sophia Garcia 12/23/2011, 1:48 PM Sophia Garcia, CNM

## 2011-12-25 LAB — PAP IG, CT-NG NAA, HPV HIGH-RISK
GC Probe Amp: NEGATIVE
HPV DNA High Risk: DETECTED — AB

## 2011-12-30 DIAGNOSIS — R87629 Unspecified abnormal cytological findings in specimens from vagina: Secondary | ICD-10-CM | POA: Insufficient documentation

## 2011-12-30 NOTE — Progress Notes (Signed)
Quick Note:  Abnormal pap +HPV needs f/o ______

## 2012-01-06 ENCOUNTER — Encounter: Payer: Self-pay | Admitting: Obstetrics and Gynecology

## 2012-01-16 ENCOUNTER — Telehealth: Payer: Self-pay

## 2012-01-16 ENCOUNTER — Telehealth: Payer: Self-pay | Admitting: Obstetrics and Gynecology

## 2012-01-16 NOTE — Telephone Encounter (Signed)
Pt called complaining of nausea, low back pain, tightness in stomach with watery bloody diarrhea. Pt stated that she had eaten tuna fish and deviled eggs. No other symptoms. Pt was advised to rest, drink plenty of water and to take tylenol as well. Pt was also advised to call back to the  Office for further instructions. CNM will be called per protocol. Mathis Bud

## 2012-01-16 NOTE — Telephone Encounter (Signed)
Pt did not return call to the office, however call was returned to her, pt stated that she had fallen asleep and all symptoms have subsided. Pt did drink water and rested. Pt stated that she believed the food that she had eaten may have caused the symptoms. Pt was advised to call the office for Dr on call if symptoms return. Sophia Garcia

## 2012-01-16 NOTE — Telephone Encounter (Signed)
PAM/CHT RECEIVED

## 2012-01-20 ENCOUNTER — Other Ambulatory Visit: Payer: Self-pay | Admitting: Obstetrics and Gynecology

## 2012-01-20 ENCOUNTER — Ambulatory Visit (INDEPENDENT_AMBULATORY_CARE_PROVIDER_SITE_OTHER): Payer: Medicaid Other | Admitting: Obstetrics and Gynecology

## 2012-01-20 ENCOUNTER — Ambulatory Visit (INDEPENDENT_AMBULATORY_CARE_PROVIDER_SITE_OTHER): Payer: Medicaid Other

## 2012-01-20 ENCOUNTER — Encounter: Payer: Self-pay | Admitting: Obstetrics and Gynecology

## 2012-01-20 VITALS — BP 100/60 | Wt 103.0 lb

## 2012-01-20 DIAGNOSIS — Z331 Pregnant state, incidental: Secondary | ICD-10-CM

## 2012-01-20 DIAGNOSIS — Z98891 History of uterine scar from previous surgery: Secondary | ICD-10-CM

## 2012-01-20 DIAGNOSIS — Z1389 Encounter for screening for other disorder: Secondary | ICD-10-CM

## 2012-01-20 DIAGNOSIS — Z9889 Other specified postprocedural states: Secondary | ICD-10-CM

## 2012-01-20 LAB — US OB COMP + 14 WK

## 2012-01-20 NOTE — Progress Notes (Signed)
Pt without c/o EFW:  260grams   9oz#,   54% AFI:4.7cm Positionvtx Placenta location: posterior Normal anatomy  cx 3.75cm 1. H/O CS vbac form given to pt.  She wil decide at nv 2. Normal pap positive HRHPV plan yearly paps encoraged smoking cessation 3. First trimester screen normal AFP today

## 2012-01-21 LAB — AFP, QUAD SCREEN
Age Alone: 1:971 {titer}
Curr Gest Age: 18.3 wks.days
Down Syndrome Scr Risk Est: 1:3810 {titer}
INH: 530.8 pg/mL
MoM for INH: 2.01
Osb Risk: 1:22900 {titer}
Trisomy 18 (Edward) Syndrome Interp.: <1:59300 {titer}
uE3 Mom: 1.73

## 2012-01-24 ENCOUNTER — Other Ambulatory Visit: Payer: Self-pay

## 2012-01-24 ENCOUNTER — Telehealth: Payer: Self-pay

## 2012-01-24 DIAGNOSIS — M85619 Other cyst of bone, unspecified shoulder: Secondary | ICD-10-CM

## 2012-01-24 NOTE — Telephone Encounter (Signed)
Message copied by Rolla Plate on Fri Jan 24, 2012  9:13 AM ------      Message from: Jaymes Graff      Created: Tue Jan 21, 2012 12:16 AM       Please schedule appt with ortho.  She hs a cyst on her right clavicle.needs evaluation

## 2012-01-24 NOTE — Telephone Encounter (Signed)
referral made to [facility] for eval of cyst right clavicle pt has appt 01/28/12 at 9:00 with Dr.McKinnley pt voice understanding

## 2012-02-13 ENCOUNTER — Encounter (HOSPITAL_COMMUNITY): Payer: Self-pay | Admitting: *Deleted

## 2012-02-13 ENCOUNTER — Emergency Department (HOSPITAL_COMMUNITY): Payer: Medicaid Other

## 2012-02-13 ENCOUNTER — Emergency Department (HOSPITAL_COMMUNITY)
Admission: EM | Admit: 2012-02-13 | Discharge: 2012-02-13 | Disposition: A | Discharge status: Home / Self Care | Payer: Medicaid Other | Attending: Emergency Medicine | Admitting: Emergency Medicine

## 2012-02-13 DIAGNOSIS — Y9389 Activity, other specified: Secondary | ICD-10-CM | POA: Insufficient documentation

## 2012-02-13 DIAGNOSIS — O99891 Other specified diseases and conditions complicating pregnancy: Secondary | ICD-10-CM | POA: Insufficient documentation

## 2012-02-13 DIAGNOSIS — S62309A Unspecified fracture of unspecified metacarpal bone, initial encounter for closed fracture: Secondary | ICD-10-CM

## 2012-02-13 DIAGNOSIS — S6990XA Unspecified injury of unspecified wrist, hand and finger(s), initial encounter: Secondary | ICD-10-CM | POA: Insufficient documentation

## 2012-02-13 DIAGNOSIS — Y998 Other external cause status: Secondary | ICD-10-CM | POA: Insufficient documentation

## 2012-02-13 DIAGNOSIS — O99330 Smoking (tobacco) complicating pregnancy, unspecified trimester: Secondary | ICD-10-CM | POA: Insufficient documentation

## 2012-02-13 DIAGNOSIS — IMO0002 Reserved for concepts with insufficient information to code with codable children: Secondary | ICD-10-CM | POA: Insufficient documentation

## 2012-02-13 MED ORDER — HYDROCODONE-ACETAMINOPHEN 5-325 MG PO TABS
1.0000 | ORAL_TABLET | Freq: Once | ORAL | Status: AC
  Administered 2012-02-13: 1 via ORAL
  Filled 2012-02-13: qty 1

## 2012-02-13 MED ORDER — HYDROCODONE-ACETAMINOPHEN 5-325 MG PO TABS: ORAL_TABLET | ORAL | Status: DC

## 2012-02-13 NOTE — ED Notes (Signed)
Pt was able to state what type of symptoms she would need to watch for with the cast.

## 2012-02-13 NOTE — Discharge Instructions (Signed)
Hand Fracture, Metacarpals  Fractures of metacarpals are breaks in the bones of the hand. They extend from the knuckles to the wrist. These bones can undergo many types of fractures. There are different ways of treating these fractures, all of which may be correct.  TREATMENT   Hand fractures can be treated with:    Non-reduction - The fracture is casted without changing the positions of the fracture (bone pieces) involved. This fracture is usually left in a cast for 4 to 6 weeks or as your caregiver thinks necessary.   Closed reduction - The bones are moved back into position without surgery and then casted.   ORIF (open reduction and internal fixation) - The fracture site is opened and the bone pieces are fixed into place with some type of hardware, such as screws, etc. They are then casted.  Your caregiver will discuss the type of fracture you have and the treatment that should be best for that problem. If surgery is chosen, let your caregivers know about the following.   LET YOUR CAREGIVERS KNOW ABOUT:   Allergies.   Medications you are taking, including herbs, eye drops, over the counter medications, and creams.   Use of steroids (by mouth or creams).   Previous problems with anesthetics or novocaine.   Possibility of pregnancy.   History of blood clots (thrombophlebitis).   History of bleeding or blood problems.   Previous surgeries.   Other health problems.  AFTER THE PROCEDURE  After surgery, you will be taken to the recovery area where a nurse will watch and check your progress. Once you are awake, stable, and taking fluids well, barring other problems, you'll be allowed to go home. Once home, an ice pack applied to your operative site may help with pain and keep the swelling down.  HOME CARE INSTRUCTIONS    Follow your caregiver's instructions as to activities, exercises, physical therapy, and driving a car.   Daily exercise is helpful for keeping range of motion and strength. Exercise as  instructed.   To lessen swelling, keep the injured hand elevated above the level of your heart as much as possible.   Apply ice to the injury for 15 to 20 minutes each hour while awake for the first 2 days. Put the ice in a plastic bag and place a thin towel between the bag of ice and your cast.   Move the fingers of your casted hand several times a day.   If a plaster or fiberglass cast was applied:   Do not try to scratch the skin under the cast using a sharp or pointed object.   Check the skin around the cast every day. You may put lotion on red or sore areas.   Keep your cast dry. Your cast can be protected during bathing with a plastic bag. Do not put your cast into the water.   If a plaster splint was applied:   Wear your splint for as long as directed by your caregiver or until seen again.   Do not get your splint wet. Protect it during bathing with a plastic bag.   You may loosen the elastic bandage around the splint if your fingers start to get numb, tingle, get cold or turn blue.   Do not put pressure on your cast or splint; this may cause it to break. Especially, do not lean plaster casts on hard surfaces for 24 hours after application.   Take medications as directed by your caregiver.     Only take over-the-counter or prescription medicines for pain, discomfort, or fever as directed by your caregiver.   Follow-up as provided by your caregiver. This is very important in order to avoid permanent injury or disability and chronic pain.  SEEK MEDICAL CARE IF:    Increased bleeding (more than a small spot) from beneath your cast or splint if there is beneath the cast as with an open reduction.   Redness, swelling, or increasing pain in the wound or from beneath your cast or splint.   Pus coming from wound or from beneath your cast or splint.   An unexplained oral temperature above 102 F (38.9 C) develops, or as your caregiver suggests.   A foul smell coming from the wound or dressing or  from beneath your cast or splint.   You have a problem moving any of your fingers.  SEEK IMMEDIATE MEDICAL CARE IF:    You develop a rash   You have difficulty breathing   You have any allergy problems  If you do not have a window in your cast for observing the wound, a discharge or minor bleeding may show up as a stain on the outside of your cast. Report these findings to your caregiver.  MAKE SURE YOU:    Understand these instructions.   Will watch your condition.   Will get help right away if you are not doing well or get worse.  Document Released: 08/05/2005 Document Revised: 07/25/2011 Document Reviewed: 03/24/2008  [employer] Patient Information 2012 [employer].

## 2012-02-13 NOTE — ED Notes (Signed)
Ulnar gutter splint applied to right hand with assistance by Dois Davenport, RN and Juliette Alcide, RN . Pt tolerated well, cms remained intact pre and post application of splint.

## 2012-02-13 NOTE — ED Notes (Addendum)
Pt was attempting to feed a dog this am when the dog attempted to jump up on her and pt hit the dog in the face with her right fist. Pt has swelling and pain to right hand, pt able to move her fingers but movement increases that pain . Pt denies any other injury.

## 2012-02-13 NOTE — ED Provider Notes (Signed)
History     CSN: [id number]  Arrival date & time 02/13/12  1402   First MD Initiated Contact with Patient 02/13/12 1426      Chief Complaint  Patient presents with  . Hand Injury    (Consider location/radiation/quality/duration/timing/severity/associated sxs/prior treatment) HPI Comments: Patient c/o pain and swelling to her right hand that began after she "punched" her dog in the head to keep the animal from jumping on her.  She denies numbness of her hand, but states she is unable to move to fifth finger due to level of pain.  Patient is currently pregnant and denies any and pains, vaginal bleeding or problems with her pregnancy so far.    Patient is a 26 y.o. female presenting with hand injury. The history is provided by the patient.  Hand Injury  The incident occurred 1 to 2 hours ago. The incident occurred at home. The injury mechanism was a direct blow. The pain is present in the right hand. The quality of the pain is described as aching. The pain is mild. The pain has been constant since the incident. Pertinent negatives include no fever and no malaise/fatigue. She reports no foreign bodies present. The symptoms are aggravated by movement, use and palpation. She has tried ice for the symptoms. The treatment provided no relief.    Past Medical History  Diagnosis Date  . Yeast infection   . BV (bacterial vaginosis)   . Normal pregnancy, incidental 12/23/2011    Past Surgical History  Procedure Date  . Cesarean section   . Tooth extraction 2009    ONE TOOTH  . Foot surgery 2002    STEPPED ON SEWING NEEDLE    Family History  Problem Relation Age of Onset  . Diabetes Father     ORAL MEDS  . Cancer Paternal Aunt     BREAST;LIVER  . Diabetes Paternal Aunt   . Diabetes Paternal Uncle   . COPD Paternal Uncle   . Cancer Maternal Grandmother     BREAST  . COPD Mother     EMPHYSEMA  . Diabetes Paternal Grandfather     History  Substance Use Topics  . Smoking status:  Current Everyday Smoker -- 0.2 packs/day for 12 years    Types: Cigarettes  . Smokeless tobacco: Never Used  . Alcohol Use: No    OB History    Grav Para Term Preterm Abortions TAB SAB Ect Mult Living   2 1 1       1       Review of Systems  Constitutional: Negative for fever, chills and malaise/fatigue.  Genitourinary: Negative for dysuria and difficulty urinating.  Musculoskeletal: Positive for joint swelling and arthralgias.  Skin: Negative for color change and wound.  All other systems reviewed and are negative.    Allergies  Chocolate; Baby oil; and Morphine and related  Home Medications   Current Outpatient Rx  Name Route Sig Dispense Refill  . ACETAMINOPHEN 325 MG PO TABS Oral Take 325 mg by mouth daily as needed. For pain    . FAMOTIDINE 20 MG PO TABS Oral Take 20 mg by mouth daily as needed.    Marland Kitchen PRENATAL MULTIVITAMIN CH Oral Take 1 tablet by mouth daily.      BP 115/65  Pulse 75  Temp 97.8 F (36.6 C)  Resp 20  Ht 4\' 11"  (1.499 m)  Wt 103 lb (46.72 kg)  BMI 20.80 kg/m2  SpO2 99%  LMP 09/13/2011  Physical Exam  Nursing note  and vitals reviewed. Constitutional: She is oriented to person, place, and time. She appears well-developed and well-nourished. No distress.  HENT:  Head: Normocephalic and atraumatic.  Cardiovascular: Normal rate, regular rhythm and normal heart sounds.   Pulmonary/Chest: Effort normal and breath sounds normal.  Musculoskeletal: She exhibits edema and tenderness.       Right hand: She exhibits decreased range of motion, tenderness, bony tenderness and swelling. She exhibits normal two-point discrimination, normal capillary refill, no deformity and no laceration. normal sensation noted.       Hands:      Localized STS to the dorsal right hand.  Mild brusing also present.  Radial pulse is brisk, sensation intact.  CR< 2 sec.    Neurological: She is alert and oriented to person, place, and time. She exhibits normal muscle tone.  Coordination normal.  Skin: Skin is warm and dry.    ED Course  Procedures (including critical care time)  Labs Reviewed - No data to display Dg Hand Complete Right  02/13/2012  *RADIOLOGY REPORT*  Clinical Data: Injury with pain.  RIGHT HAND - COMPLETE 3+ VIEW  Comparison: None.  Findings: There is a mildly comminuted and angulated fracture of the distal aspect of the fifth metacarpal shaft.  Overlying soft tissue swelling.  Fracture line does not appear to extend to the metacarpal phalangeal joint but cannot be excluded definitively. No additional evidence of acute fracture.  IMPRESSION: Mildly comminuted and angulated fifth metacarpal shaft fracture.  Original Report Authenticated By: Reyes Ivan, M.D.        MDM     Ulnar gutter splint applied.  Pain improved.  Remains NV intact.  Pt agrees to f/u with orthopedics.  Will refer to Dr. Romeo Apple  Patient / Family / Caregiver understand and agree with initial ED impression and plan with expectations set for ED visit. Pt stable in ED with no significant deterioration in condition. Pt feels improved after observation and/or treatment in ED.    Prescribed: Norco# 10     Margarite Vessel L. [place], [place] 02/16/12 [phone]

## 2012-02-16 NOTE — ED Provider Notes (Signed)
Medical screening examination/treatment/procedure(s) were performed by non-physician practitioner and as supervising physician I was immediately available for consultation/collaboration.  Gillian Meeuwsen, MD, FACEP   Vernia Teem L Katherine Tout, MD 02/16/12 1559 

## 2012-02-17 ENCOUNTER — Encounter: Payer: Self-pay | Admitting: Orthopedic Surgery

## 2012-02-17 ENCOUNTER — Ambulatory Visit (INDEPENDENT_AMBULATORY_CARE_PROVIDER_SITE_OTHER): Payer: Medicaid Other

## 2012-02-17 ENCOUNTER — Encounter: Payer: Self-pay | Admitting: Obstetrics and Gynecology

## 2012-02-17 ENCOUNTER — Ambulatory Visit (INDEPENDENT_AMBULATORY_CARE_PROVIDER_SITE_OTHER): Payer: Medicaid Other | Admitting: Obstetrics and Gynecology

## 2012-02-17 ENCOUNTER — Other Ambulatory Visit: Payer: Self-pay | Admitting: Orthopedic Surgery

## 2012-02-17 ENCOUNTER — Ambulatory Visit (INDEPENDENT_AMBULATORY_CARE_PROVIDER_SITE_OTHER): Payer: Medicaid Other | Admitting: Orthopedic Surgery

## 2012-02-17 VITALS — BP 90/62 | Wt 111.0 lb

## 2012-02-17 VITALS — BP 120/70 | Ht 59.0 in | Wt 111.0 lb

## 2012-02-17 DIAGNOSIS — S62309A Unspecified fracture of unspecified metacarpal bone, initial encounter for closed fracture: Secondary | ICD-10-CM

## 2012-02-17 DIAGNOSIS — Z3490 Encounter for supervision of normal pregnancy, unspecified, unspecified trimester: Secondary | ICD-10-CM

## 2012-02-17 DIAGNOSIS — T148XXA Other injury of unspecified body region, initial encounter: Secondary | ICD-10-CM

## 2012-02-17 DIAGNOSIS — Z331 Pregnant state, incidental: Secondary | ICD-10-CM

## 2012-02-17 MED ORDER — HYDROCODONE-ACETAMINOPHEN 5-325 MG PO TABS: ORAL_TABLET | ORAL | Status: AC

## 2012-02-17 NOTE — Progress Notes (Signed)
Pt requests another CS & BTL

## 2012-02-17 NOTE — Progress Notes (Signed)
[place] [redacted]W[redacted]d ROB  Hx of fracture of 5th Metacarpel s/p altercation with her big - states that boxed dog in head Film of fracture seen as patient had copy with her at visit. Distal end of 5th metacarpel displaced and to f/u with Orthos today as scheduled management plan. Continues to smoke. Advised re smoking in pregnancy Denies THC use. No pregnancy issued to report today At present taking Oxocodone 5mg  Q6hrly as prescribed by [employer]. Had asked for refill but declined as as advised patient to see Orthos for management plan. To have Glucola at 28 weeks.

## 2012-02-17 NOTE — Progress Notes (Signed)
Subjective:    Patient ID: Sophia Garcia, female    DOB: Jan 30, 1986, 26 y.o.   MRN: [id number] Chief Complaint  Patient presents with  . Fracture    Right hand fracture, DOI 02/13/12    BP 120/70  Ht 4\' 11"  (1.499 m)  Wt 111 lb (50.349 kg)  BMI 22.42 kg/m2  LMP 09/13/2011   HPI   Today I have a 26 year old female who is pregnant with a due date of November 1 followed by [facility] obstetricians who presents with a fifth metacarpal fracture secondary to hitting a dog. Complains a sharp dull throbbing stabbing burning 10 out of 10 constant pain with bruising and swelling she reports locking of that symptom is unclear she's been icing and elevating. She had an x-ray which showed a slightly more proximal metacarpal fracture towards the distal limb of the fifth metacarpal with a boxer's fracture mechanism.  Her review of systems is positive for fatigue intermittent chest pain wheezing heartburn nausea frequency itching seizure disorder easy bruising excessive urination adverse reaction allergy to chocolate as well as seasonal allergies  Past Medical History  Diagnosis Date  . Yeast infection   . BV (bacterial vaginosis)   . Normal pregnancy, incidental 12/23/2011    Past Surgical History  Procedure Date  . Cesarean section   . Tooth extraction 2009    ONE TOOTH  . Foot surgery 2002    STEPPED ON SEWING NEEDLE    Current Outpatient Prescriptions on File Prior to Visit  Medication Sig Dispense Refill  . acetaminophen (TYLENOL) 325 MG tablet Take 325 mg by mouth daily as needed. For pain      . famotidine (PEPCID) 20 MG tablet Take 20 mg by mouth daily as needed.      . Prenatal Vit-Fe Fumarate-FA (PRENATAL MULTIVITAMIN) TABS Take 1 tablet by mouth daily.       History   Social History  . Marital Status: Married    Spouse Name: N/A    Number of Children: N/A  . Years of Education: 9   Occupational History  . Not on file.   Social History Main Topics  . Smoking  status: Current Everyday Smoker -- 0.2 packs/day for 12 years    Types: Cigarettes  . Smokeless tobacco: Never Used  . Alcohol Use: No  . Drug Use: No     WAS SMOKING D/T MORNING SICKNESS  . Sexually Active: Yes -- Female partner(s)    Birth Control/ Protection: None   Other Topics Concern  . Not on file   Social History Narrative  . No narrative on file    The patient has a family history of Family History  Problem Relation Age of Onset  . Diabetes Father     ORAL MEDS  . Cancer Paternal Aunt     BREAST;LIVER  . Diabetes Paternal Aunt   . Diabetes Paternal Uncle   . COPD Paternal Uncle   . Cancer Maternal Grandmother     BREAST  . COPD Mother     EMPHYSEMA  . Diabetes Paternal Grandfather    Review of Systems     Objective:   Physical Exam  Constitutional: She is oriented to person, place, and time. She appears well-developed and well-nourished.       Pregnant body habitus  Neck: Normal range of motion. Neck supple.  Cardiovascular: Normal rate and intact distal pulses.   Pulmonary/Chest: Effort normal.  Abdominal: She exhibits no distension.  Musculoskeletal:  Lower extremity exam  Ambulation is normal.  Inspection and palpation revealed no tenderness or abnormality in alignment in the lower extremities. Range of motion is full.  Strength is grade 5.   all joints are stable.   Left upper extremity range of motion strength stability and alignment are normal.  Right upper extremity shoulder and elbow range of motion are normal. She is tenderness over the fifth metacarpal fracture with apex anterior angulation decreased range of motion of the metacarpophalangeal joint.  Elbow and shoulder otherwise stable wrist joint is stable.  Muscle tone and extremity normal    Neurological: She is alert and oriented to person, place, and time. She has normal reflexes.  Skin: Skin is warm and dry.  Psychiatric: She has a normal mood and affect. Her behavior is  normal. Judgment and thought content normal.    Imaging hospital films show apex anterior angulation of the fifth metacarpal fracture just proximal to the neck  Postreduction x-rays showed improvement in the overall alignment of the fracture        Assessment & Plan:  Apex anterior angulated fifth metacarpal fracture and a pregnant female  Close reduction and splinting with a clamdigger splint  X-ray in 3 weeks  I prefer not to operate on this patient based on her pregnancy.  Procedure  Verbal consent was obtained to reduce the fracture timeout confirmed the right hand as a surgical site  A hematoma block was performed with 1% lidocaine total 8 cc. After 5 minutes the fracture was manipulated in a clamdigger splint was applied  This was without complication

## 2012-02-17 NOTE — Patient Instructions (Addendum)
Keep hand elevated. Do not remove splint. Do not get splint wet. Take medication as ordered.

## 2012-03-01 ENCOUNTER — Telehealth: Payer: Self-pay | Admitting: Obstetrics and Gynecology

## 2012-03-04 ENCOUNTER — Telehealth: Payer: Self-pay | Admitting: Obstetrics and Gynecology

## 2012-03-04 NOTE — Telephone Encounter (Signed)
Per MK, Tc to pt.  LM to return call to obtain info about where pt had abnormal Pap and to obtain records.

## 2012-03-05 ENCOUNTER — Telehealth: Payer: Self-pay | Admitting: Obstetrics and Gynecology

## 2012-03-05 NOTE — Telephone Encounter (Signed)
Spoke with pt rgd msg pt wants to know what she can take for allergies advised pt can take plain sudafed, plain Claritin, plain zyrtec, plain mucinex pt voice understanding

## 2012-03-09 ENCOUNTER — Ambulatory Visit (INDEPENDENT_AMBULATORY_CARE_PROVIDER_SITE_OTHER): Payer: Medicaid Other

## 2012-03-09 ENCOUNTER — Ambulatory Visit (INDEPENDENT_AMBULATORY_CARE_PROVIDER_SITE_OTHER): Payer: Medicaid Other | Admitting: Orthopedic Surgery

## 2012-03-09 ENCOUNTER — Encounter: Payer: Self-pay | Admitting: Orthopedic Surgery

## 2012-03-09 VITALS — BP 92/60 | Ht 59.0 in | Wt 111.0 lb

## 2012-03-09 DIAGNOSIS — S6291XA Unspecified fracture of right wrist and hand, initial encounter for closed fracture: Secondary | ICD-10-CM

## 2012-03-09 DIAGNOSIS — S62309A Unspecified fracture of unspecified metacarpal bone, initial encounter for closed fracture: Secondary | ICD-10-CM

## 2012-03-09 NOTE — Progress Notes (Signed)
Patient ID: Sophia Garcia, female   DOB: 06-03-1986, 26 y.o.   MRN: [id number] Chief Complaint  Patient presents with  . Follow-up    3 week follow up and xray right hand, DOI 6'27/13    X-ray today shows that the fracture position is maintained with less than 25 of angulation,  She's placed in the boxers pre fab splint  X-ray in 3 weeks

## 2012-03-09 NOTE — Patient Instructions (Addendum)
WEAR SPLINT EXCEPT BATHING   Do 25 hand exercises 3 x a day

## 2012-03-16 ENCOUNTER — Telehealth: Payer: Self-pay

## 2012-03-16 NOTE — Telephone Encounter (Signed)
Message copied by Rolla Plate on Mon Mar 16, 2012 11:09 AM ------      Message from: Jaymes Graff      Created: Fri Mar 13, 2012 12:08 AM      Regarding: RE: does this pt need colpo?       Yes.  Please schedule, Kailen Hinkle      ----- Message -----         From: Lavera Guise, CNM         Sent: 03/11/2012   2:55 PM           To: Michael Litter, MD      Subject: does this pt need colpo?                                 Have repeatedly asked staff to obtain records for hx of abn pap with no results we have WNL pap 2008      [facility]

## 2012-03-16 NOTE — Telephone Encounter (Signed)
Lm on vm tcb rgd labs and an appt pt need colpo per nd

## 2012-03-18 ENCOUNTER — Ambulatory Visit (INDEPENDENT_AMBULATORY_CARE_PROVIDER_SITE_OTHER): Payer: Medicaid Other | Admitting: Obstetrics and Gynecology

## 2012-03-18 VITALS — BP 100/64 | Wt 111.0 lb

## 2012-03-18 DIAGNOSIS — IMO0002 Reserved for concepts with insufficient information to code with codable children: Secondary | ICD-10-CM

## 2012-03-18 DIAGNOSIS — R87612 Low grade squamous intraepithelial lesion on cytologic smear of cervix (LGSIL): Secondary | ICD-10-CM

## 2012-03-18 DIAGNOSIS — Z331 Pregnant state, incidental: Secondary | ICD-10-CM

## 2012-03-18 NOTE — Progress Notes (Signed)
colpo done today Adequate No aw changes Pap WNLs + HPV   Recommend colpo PP glucola today

## 2012-03-19 LAB — RPR

## 2012-03-20 LAB — GLUCOSE TOLERANCE, 1 HOUR (50G) W/O FASTING: Glucose, 1 Hour GTT: 115 mg/dL (ref 70–140)

## 2012-03-30 ENCOUNTER — Ambulatory Visit (INDEPENDENT_AMBULATORY_CARE_PROVIDER_SITE_OTHER): Payer: Medicaid Other | Admitting: Orthopedic Surgery

## 2012-03-30 ENCOUNTER — Ambulatory Visit (INDEPENDENT_AMBULATORY_CARE_PROVIDER_SITE_OTHER): Payer: Medicaid Other

## 2012-03-30 ENCOUNTER — Encounter: Payer: Self-pay | Admitting: Orthopedic Surgery

## 2012-03-30 VITALS — BP 120/70 | Ht 59.0 in | Wt 114.0 lb

## 2012-03-30 DIAGNOSIS — S62309A Unspecified fracture of unspecified metacarpal bone, initial encounter for closed fracture: Secondary | ICD-10-CM

## 2012-03-30 DIAGNOSIS — S6290XA Unspecified fracture of unspecified wrist and hand, initial encounter for closed fracture: Secondary | ICD-10-CM

## 2012-03-30 NOTE — Patient Instructions (Signed)
activities as tolerated 

## 2012-03-30 NOTE — Progress Notes (Signed)
Patient ID: Sophia Garcia, female   DOB: 07-31-1986, 26 y.o.   MRN: [id number] Chief Complaint  Patient presents with  . Follow-up    recheck and xray right hand, DOI 02/13/12    BP 120/70  Ht 4\' 11"  (1.499 m)  Wt 114 lb (51.71 kg)  BMI 23.03 kg/m2  LMP 09/13/2011  RIGHT 5th metacarpal fracture, treated with splint after closed reduction and followed by prefabricated splint. X-rays oday.[name] is in sable configuraion. Less han 35 angulation on x-ray.  No rotatory deformity. Clinically. She can make a full fist.  Impression clinically healed RIGHT 5th metacarpal fracture. Patient can return to normal activities

## 2012-04-01 ENCOUNTER — Inpatient Hospital Stay (HOSPITAL_COMMUNITY)
Admission: AD | Admit: 2012-04-01 | Discharge: 2012-04-01 | Disposition: A | Discharge status: Home / Self Care | Payer: Medicaid Other | Source: Ambulatory Visit | Attending: Obstetrics and Gynecology | Admitting: Obstetrics and Gynecology

## 2012-04-01 ENCOUNTER — Ambulatory Visit (INDEPENDENT_AMBULATORY_CARE_PROVIDER_SITE_OTHER): Payer: Medicaid Other | Admitting: Obstetrics and Gynecology

## 2012-04-01 ENCOUNTER — Encounter: Payer: Self-pay | Admitting: Obstetrics and Gynecology

## 2012-04-01 ENCOUNTER — Other Ambulatory Visit: Payer: Self-pay | Admitting: Emergency Medicine

## 2012-04-01 ENCOUNTER — Other Ambulatory Visit: Payer: Medicaid Other

## 2012-04-01 VITALS — BP 110/60 | Wt 115.0 lb

## 2012-04-01 DIAGNOSIS — Z3490 Encounter for supervision of normal pregnancy, unspecified, unspecified trimester: Secondary | ICD-10-CM

## 2012-04-01 DIAGNOSIS — M899 Disorder of bone, unspecified: Secondary | ICD-10-CM

## 2012-04-01 DIAGNOSIS — O9989 Other specified diseases and conditions complicating pregnancy, childbirth and the puerperium: Secondary | ICD-10-CM

## 2012-04-01 DIAGNOSIS — O4700 False labor before 37 completed weeks of gestation, unspecified trimester: Secondary | ICD-10-CM | POA: Insufficient documentation

## 2012-04-01 DIAGNOSIS — M62830 Muscle spasm of back: Secondary | ICD-10-CM

## 2012-04-01 DIAGNOSIS — O479 False labor, unspecified: Secondary | ICD-10-CM

## 2012-04-01 DIAGNOSIS — Z331 Pregnant state, incidental: Secondary | ICD-10-CM

## 2012-04-01 DIAGNOSIS — M259 Joint disorder, unspecified: Secondary | ICD-10-CM

## 2012-04-01 LAB — URINALYSIS, ROUTINE W REFLEX MICROSCOPIC
Bilirubin Urine: NEGATIVE
Hgb urine dipstick: NEGATIVE
Nitrite: NEGATIVE
Specific Gravity, Urine: 1.01 (ref 1.005–1.030)
pH: 7.5 (ref 5.0–8.0)

## 2012-04-01 MED ORDER — LACTATED RINGERS IV SOLN
INTRAVENOUS | Status: DC
  Administered 2012-04-01: 14:00:00 via INTRAVENOUS

## 2012-04-01 MED ORDER — NIFEDIPINE 10 MG PO CAPS: 10.0000 mg | ORAL_CAPSULE | ORAL | Status: DC | PRN

## 2012-04-01 MED ORDER — CYCLOBENZAPRINE HCL 10 MG PO TABS: 5.0000 mg | ORAL_TABLET | Freq: Three times a day (TID) | ORAL | Status: AC

## 2012-04-01 MED ORDER — CYCLOBENZAPRINE HCL 10 MG PO TABS
10.0000 mg | ORAL_TABLET | Freq: Three times a day (TID) | ORAL | Status: DC
  Administered 2012-04-01: 10 mg via ORAL
  Filled 2012-04-01: qty 1

## 2012-04-01 MED ORDER — NIFEDIPINE 10 MG PO CAPS
10.0000 mg | ORAL_CAPSULE | ORAL | Status: DC | PRN
  Administered 2012-04-01: 10 mg via ORAL
  Filled 2012-04-01: qty 1

## 2012-04-01 NOTE — Addendum Note (Signed)
Addended by: Mathis Bud on: 04/01/2012 12:32 PM   Modules accepted: Level of Service

## 2012-04-01 NOTE — Progress Notes (Signed)
States pain is gone, has been sleeping. Neg FFN Discharge home. Lavera Guise, CNM

## 2012-04-01 NOTE — Progress Notes (Signed)
No concerns. 

## 2012-04-01 NOTE — Progress Notes (Signed)
History  Sophia Garcia is a 26 y.o. G2P1001 at [redacted]w[redacted]d  Patient Active Problem List  Diagnosis  . Normal pregnancy, incidental  . Normal pregnancy, incidental  . History of cesarean section  . Substance abuse complicating pregnancy, antepartum  . Tobacco abuse  . Substance abuse complicating pregnancy, antepartum  . Abnormal vaginal Pap smear  . Fracture of metacarpal bone  . Metacarpal bone fracture    Subjective: States "lower back spasms at times only relieve with pressue on lower back, no N,V,D, UTI s/s or constipation, No contractions, srom, or vag bleeding, with +FM.  Chief Complaint  Patient presents with  . Labor Eval   @SFHPI @  Vitals:  Blood pressure 110/58, pulse 91, temperature 97.6 F (36.4 C), temperature source Oral, resp. rate 22, height 4' 10.5" (1.486 m), weight 114 lb 9.6 oz (51.982 kg), last menstrual period 09/13/2011. OB History    Grav Para Term Preterm Abortions TAB SAB Ect Mult Living   2 1 1       1       Past Medical History  Diagnosis Date  . Yeast infection   . BV (bacterial vaginosis)   . Normal pregnancy, incidental 12/23/2011    Past Surgical History  Procedure Date  . Cesarean section   . Tooth extraction 2009    ONE TOOTH  . Foot surgery 2002    STEPPED ON SEWING NEEDLE    Family History  Problem Relation Age of Onset  . Diabetes Father     ORAL MEDS  . Cancer Paternal Aunt     BREAST;LIVER  . Diabetes Paternal Aunt   . Diabetes Paternal Uncle   . COPD Paternal Uncle   . Cancer Maternal Grandmother     BREAST  . COPD Mother     EMPHYSEMA  . Diabetes Paternal Grandfather     History  Substance Use Topics  . Smoking status: Current Everyday Smoker -- 0.2 packs/day for 12 years    Types: Cigarettes  . Smokeless tobacco: Never Used  . Alcohol Use: No    Allergies:  Allergies  Allergen Reactions  . Chocolate     DARK only  . Baby Oil Rash  . Morphine And Related Rash    Prescriptions prior to admission    Medication Sig Dispense Refill  . acetaminophen (TYLENOL) 325 MG tablet Take 325 mg by mouth daily as needed. For pain      . famotidine (PEPCID) 20 MG tablet Take 20 mg by mouth daily as needed.      Marland Kitchen HYDROcodone-acetaminophen (NORCO/VICODIN) 5-325 MG per tablet Take 1 tablet by mouth every 6 (six) hours as needed. Pain from broken hand      . Prenatal Vit-Fe Fumarate-FA (PRENATAL MULTIVITAMIN) TABS Take 1 tablet by mouth daily.        @ROS @ Physical Exam   Blood pressure 110/58, pulse 91, temperature 97.6 F (36.4 C), temperature source Oral, resp. rate 22, height 4' 10.5" (1.486 m), weight 114 lb 9.6 oz (51.982 kg), last menstrual period 09/13/2011.  @PHYSEXAMBYAGE2 @ Labs:  No results found for this or any previous visit (from the past 24 hour(s)).  Imaging Studies:      Medications:  Scheduled   I have reviewed the patient's current medications. PNV ASSESSMENT: Patient Active Problem List  Diagnosis  . Normal pregnancy, incidental  . Normal pregnancy, incidental  . History of cesarean section  . Substance abuse complicating pregnancy, antepartum  . Tobacco abuse  . Substance abuse complicating pregnancy, antepartum  .  Abnormal vaginal Pap smear  . Fracture of metacarpal bone  . Metacarpal bone fracture   Physical Examination:  General appearance - alert, well appearing, and in no distress, winces and moves in bed q 2-5 minutes for 20-60 seconds, with lower abdominal pain Physical exam: Calm, no distress, HEENT grossly wnl lungs clear bilaterally, AP RRR, abd soft, gravid, nt, bowel sounds active, abdomen nontender o palpation, neg CVAT No contractions palpable Fundal height 28 No edema to lower extremities Results for orders placed during the hospital encounter of 04/01/12 (from the past 24 hour(s))  URINALYSIS, ROUTINE W REFLEX MICROSCOPIC     Status: Normal   Collection Time   04/01/12 12:28 PM      Component Value Range   Color, Urine YELLOW  YELLOW    APPearance CLEAR  CLEAR   Specific Gravity, Urine 1.010  1.005 - 1.030   pH 7.5  5.0 - 8.0   Glucose, UA NEGATIVE  NEGATIVE mg/dL   Hgb urine dipstick NEGATIVE  NEGATIVE   Bilirubin Urine NEGATIVE  NEGATIVE   Ketones, ur NEGATIVE  NEGATIVE mg/dL   Protein, ur NEGATIVE  NEGATIVE mg/dL   Urobilinogen, UA 0.2  0.0 - 1.0 mg/dL   Nitrite NEGATIVE  NEGATIVE   Leukocytes, UA NEGATIVE  NEGATIVE     ED Course  Assessment/Plan Fetal Monitoring:   fhts category 1 uc not tracing, toco adjusted Vag closed 50% at office unchanged with exam at 1345 A/P [redacted]w[redacted]d Preterm uc Backache P IV bolus, UA, FFN pending from office discussed with Dr. Normand Sloop, will give flexeril and procardia protocol. Lavera Guise, CNM

## 2012-04-01 NOTE — Progress Notes (Signed)
[redacted]w[redacted]d Patient has been having contractions for the past 2 weeks. Very uncomfortable. FNN today. NST to view CTX pattern. NST; FHT's baseline 145, CTX 2 -3 mins with FM++, Uterine irritability ++ Contacted MK CNM at the hospital and patient will go to [facility]U for further assessment.

## 2012-04-03 ENCOUNTER — Encounter (HOSPITAL_COMMUNITY): Payer: Self-pay | Admitting: *Deleted

## 2012-04-03 ENCOUNTER — Inpatient Hospital Stay (HOSPITAL_COMMUNITY)
Admission: AD | Admit: 2012-04-03 | Discharge: 2012-04-03 | Disposition: A | Discharge status: Home / Self Care | Payer: Medicaid Other | Source: Ambulatory Visit | Attending: Obstetrics and Gynecology | Admitting: Obstetrics and Gynecology

## 2012-04-03 ENCOUNTER — Inpatient Hospital Stay (HOSPITAL_COMMUNITY): Payer: Medicaid Other

## 2012-04-03 DIAGNOSIS — M259 Joint disorder, unspecified: Secondary | ICD-10-CM

## 2012-04-03 DIAGNOSIS — O4700 False labor before 37 completed weeks of gestation, unspecified trimester: Secondary | ICD-10-CM | POA: Insufficient documentation

## 2012-04-03 DIAGNOSIS — R109 Unspecified abdominal pain: Secondary | ICD-10-CM | POA: Insufficient documentation

## 2012-04-03 DIAGNOSIS — O9989 Other specified diseases and conditions complicating pregnancy, childbirth and the puerperium: Secondary | ICD-10-CM

## 2012-04-03 DIAGNOSIS — M899 Disorder of bone, unspecified: Secondary | ICD-10-CM

## 2012-04-03 DIAGNOSIS — M549 Dorsalgia, unspecified: Secondary | ICD-10-CM

## 2012-04-03 LAB — URINE MICROSCOPIC-ADD ON

## 2012-04-03 LAB — URINALYSIS, ROUTINE W REFLEX MICROSCOPIC
Bilirubin Urine: NEGATIVE
Glucose, UA: NEGATIVE mg/dL
Nitrite: NEGATIVE
Specific Gravity, Urine: 1.02 (ref 1.005–1.030)
pH: 6 (ref 5.0–8.0)

## 2012-04-03 LAB — OB RESULTS CONSOLE GBS: GBS: NEGATIVE

## 2012-04-03 NOTE — MAU Note (Signed)
Sophia Garcia is a 26 y.o. G2P1001 at [redacted]w[redacted]d  Patient Active Problem List   Diagnosis   .  Normal pregnancy, incidental   .  Normal pregnancy, incidental   .  History of cesarean section   .  Substance abuse complicating pregnancy, antepartum   .  Tobacco abuse   .  Substance abuse complicating pregnancy, antepartum   .  Abnormal vaginal Pap smear   .  Fracture of metacarpal bone   .  Metacarpal bone fracture    Subjective:  States "lower back spasms getting out of bed x 1 today, no N,V,D, UTI s/s or constipation, No contractions, srom, or vag bleeding, with a wet feeling clear but not needing to wear a pad, with +FM.  Chief Complaint   Patient presents with   .  Labor Eval    BP 107/62  Pulse 82  Temp 98.1 F (36.7 C) (Oral)  Resp 16  Ht 4\' 10"  (1.473 m)  Wt 116 lb (52.617 kg)  BMI 24.24 kg/m2  LMP 09/13/2011 OB History    Grav  Para  Term  Preterm  Abortions  TAB  SAB  Ect  Mult  Living    2  1  1        1       Past Medical History   Diagnosis  Date   .  Yeast infection    .  BV (bacterial vaginosis)    .  Normal pregnancy, incidental  12/23/2011    Past Surgical History   Procedure  Date   .  Cesarean section    .  Tooth extraction  2009     ONE TOOTH   .  Foot surgery  2002     STEPPED ON SEWING NEEDLE    Family History   Problem  Relation  Age of Onset   .  Diabetes  Father       ORAL MEDS    .  Cancer  Paternal Aunt       BREAST;LIVER    .  Diabetes  Paternal Aunt    .  Diabetes  Paternal Uncle    .  COPD  Paternal Uncle    .  Cancer  Maternal Grandmother       BREAST    .  COPD  Mother       EMPHYSEMA    .  Diabetes  Paternal Grandfather     History   Substance Use Topics   .  Smoking status:  Current Everyday Smoker -- 0.2 packs/day for 12 years     Types:  Cigarettes   .  Smokeless tobacco:  Never Used   .  Alcohol Use:  No    Allergies:  Allergies   Allergen  Reactions   .  Chocolate      DARK only   .  Baby Oil  Rash   .   Morphine And Related  Rash    Prescriptions prior to admission   Medication  Sig  Dispense  Refill   .  acetaminophen (TYLENOL) 325 MG tablet  Take 325 mg by mouth daily as needed. For pain     .  famotidine (PEPCID) 20 MG tablet  Take 20 mg by mouth daily as needed.     Marland Kitchen  HYDROcodone-acetaminophen (NORCO/VICODIN) 5-325 MG per tablet  Take 1 tablet by mouth every 6 (six) hours as needed. Pain from broken hand     .  Prenatal Vit-Fe  Fumarate-FA (PRENATAL MULTIVITAMIN) TABS  Take 1 tablet by mouth daily.      @ROS @  Physical Exam   Blood pressure 110/58, pulse 91, temperature 97.6 F (36.4 C), temperature source Oral, resp. rate 22, height 4' 10.5" (1.486 m), weight 114 lb 9.6 oz (51.982 kg), last menstrual period 09/13/2011.  @PHYSEXAMBYAGE2 @  Labs:  No results found for this or any previous visit (from the past 24 hour(s)).  Imaging Studies:  Medications: Scheduled  Medication List  As of 04/03/2012 10:47 AM   ASK your doctor about these medications         cyclobenzaprine 10 MG tablet   Commonly known as: FLEXERIL   Take 0.5 tablets (5 mg total) by mouth 3 (three) times daily.      famotidine 20 MG tablet   Commonly known as: PEPCID   Take 20 mg by mouth daily as needed.      HYDROcodone-acetaminophen 5-325 MG per tablet   Commonly known as: NORCO/VICODIN   Take 1 tablet by mouth every 6 (six) hours as needed. Pt takes 1/2 tablet as needed forpain.      prenatal multivitamin Tabs   Take 1 tablet by mouth daily.           I have reviewed the patient's current medications.  PNV  ASSESSMENT:  Patient Active Problem List   Diagnosis   .  Normal pregnancy, incidental   .  Normal pregnancy, incidental   .  History of cesarean section   .  Substance abuse complicating pregnancy, antepartum   .  Tobacco abuse   .  Substance abuse complicating pregnancy, antepartum   .  Abnormal vaginal Pap smear   .  Fracture of metacarpal bone   .  Metacarpal bone fracture    Physical  Examination:  General appearance - alert, well appearing, and in no distress, winces and moves in bed q 2-5 minutes for 20-60 seconds, with lower abdominal pain  Physical exam: Calm, no distress, HEENT grossly wnl lungs clear bilaterally, AP RRR, abd soft, gravid, nt, bowel sounds active, abdomen nontender o palpation, neg CVAT  No contractions palpable  Fundal height 28  No edema to lower extremities  Results for orders placed during the hospital encounter of 04/01/12 (from the past 24 hour(s))   URINALYSIS, ROUTINE W REFLEX MICROSCOPIC Status: Normal    Collection Time    04/01/12 12:28 PM   Component  Value  Range    Color, Urine  YELLOW  YELLOW    APPearance  CLEAR  CLEAR    Specific Gravity, Urine  1.010  1.005 - 1.030    pH  7.5  5.0 - 8.0    Glucose, UA  NEGATIVE  NEGATIVE mg/dL    Hgb urine dipstick  NEGATIVE  NEGATIVE    Bilirubin Urine  NEGATIVE  NEGATIVE    Ketones, ur  NEGATIVE  NEGATIVE mg/dL    Protein, ur  NEGATIVE  NEGATIVE mg/dL    Urobilinogen, UA  0.2  0.0 - 1.0 mg/dL    Nitrite  NEGATIVE  NEGATIVE    Leukocytes, UA  NEGATIVE  NEGATIVE    ED Course   Assessment/Plan  Fetal Monitoring:  fhts category 1  uc not tracing, toco adjusted  Vag closed 50% at office unchanged with exam at this week A/P  [redacted]w[redacted]d abdominal pain Hx cesarean section Preterm uc  P UA, gc/chl, GBS, OB US, po fluids Lavera Guise, CNM  ADDENDUM [place] VTX 63% growth, AFI wnl, cervix  3.74 Discharge home back exercise flexeril discussed. Lavera Guise, CNM

## 2012-04-03 NOTE — MAU Note (Signed)
Here a couple days ago- PTL eval, shortened cervix.  Pains became worse during the night. No bleeding, increased discharge.

## 2012-04-03 NOTE — MAU Note (Signed)
Patient awoke from sleep this morning with sharp pain in lower back, lower and upper abdomen. Denies bleeding, increased discharge.

## 2012-04-04 LAB — GC/CHLAMYDIA PROBE AMP, GENITAL
Chlamydia, DNA Probe: NEGATIVE
GC Probe Amp, Genital: NEGATIVE

## 2012-04-06 LAB — CULTURE, BETA STREP (GROUP B ONLY)

## 2012-04-07 ENCOUNTER — Ambulatory Visit (INDEPENDENT_AMBULATORY_CARE_PROVIDER_SITE_OTHER): Payer: Medicaid Other | Admitting: Obstetrics and Gynecology

## 2012-04-07 ENCOUNTER — Encounter: Payer: Self-pay | Admitting: Obstetrics and Gynecology

## 2012-04-07 MED ORDER — NIFEDIPINE 10 MG PO CAPS: 10.0000 mg | ORAL_CAPSULE | ORAL | Status: DC | PRN

## 2012-04-07 NOTE — Progress Notes (Signed)
C/o knot on R arm near IV site from 8/16 MAU visit C/o possible yeast infection  03/18/12 1 gtt 115 Hemoglobin 11.4 RPR NR

## 2012-04-07 NOTE — Progress Notes (Signed)
Seen in MAU after last visit and on 8/16 for cramping. Cervix closed, 50% then. FFN negative 04/01/12. Has sporadic cramping every day, particularly with activity. Cervix today closed, thick, vtx, -2, soft.. Recommend Procardia 10 mg po q 4-6 hours prn--may take q 4-6 hours more routinely next few days to determine if helps. Reviewed s/s PTL. Plan RTO next week for repeat FFN Wants repeat C/S with BTL. Needs to sign BTL consent NV. Small hematoma on right forearm at site of last hospital IV--comfort measures reviewed. Reviewed glucola results.

## 2012-04-09 ENCOUNTER — Encounter: Payer: Medicaid Other | Admitting: Obstetrics and Gynecology

## 2012-04-11 IMAGING — CR DG FOOT COMPLETE 3+V*L*
3 series · 3 of 3 positions shown · non-contrast
Comparison: None

CLINICAL DATA: Stepped on a nail.

LEFT FOOT - COMPLETE 3+ VIEW

[view not recorded (1 of 3)]
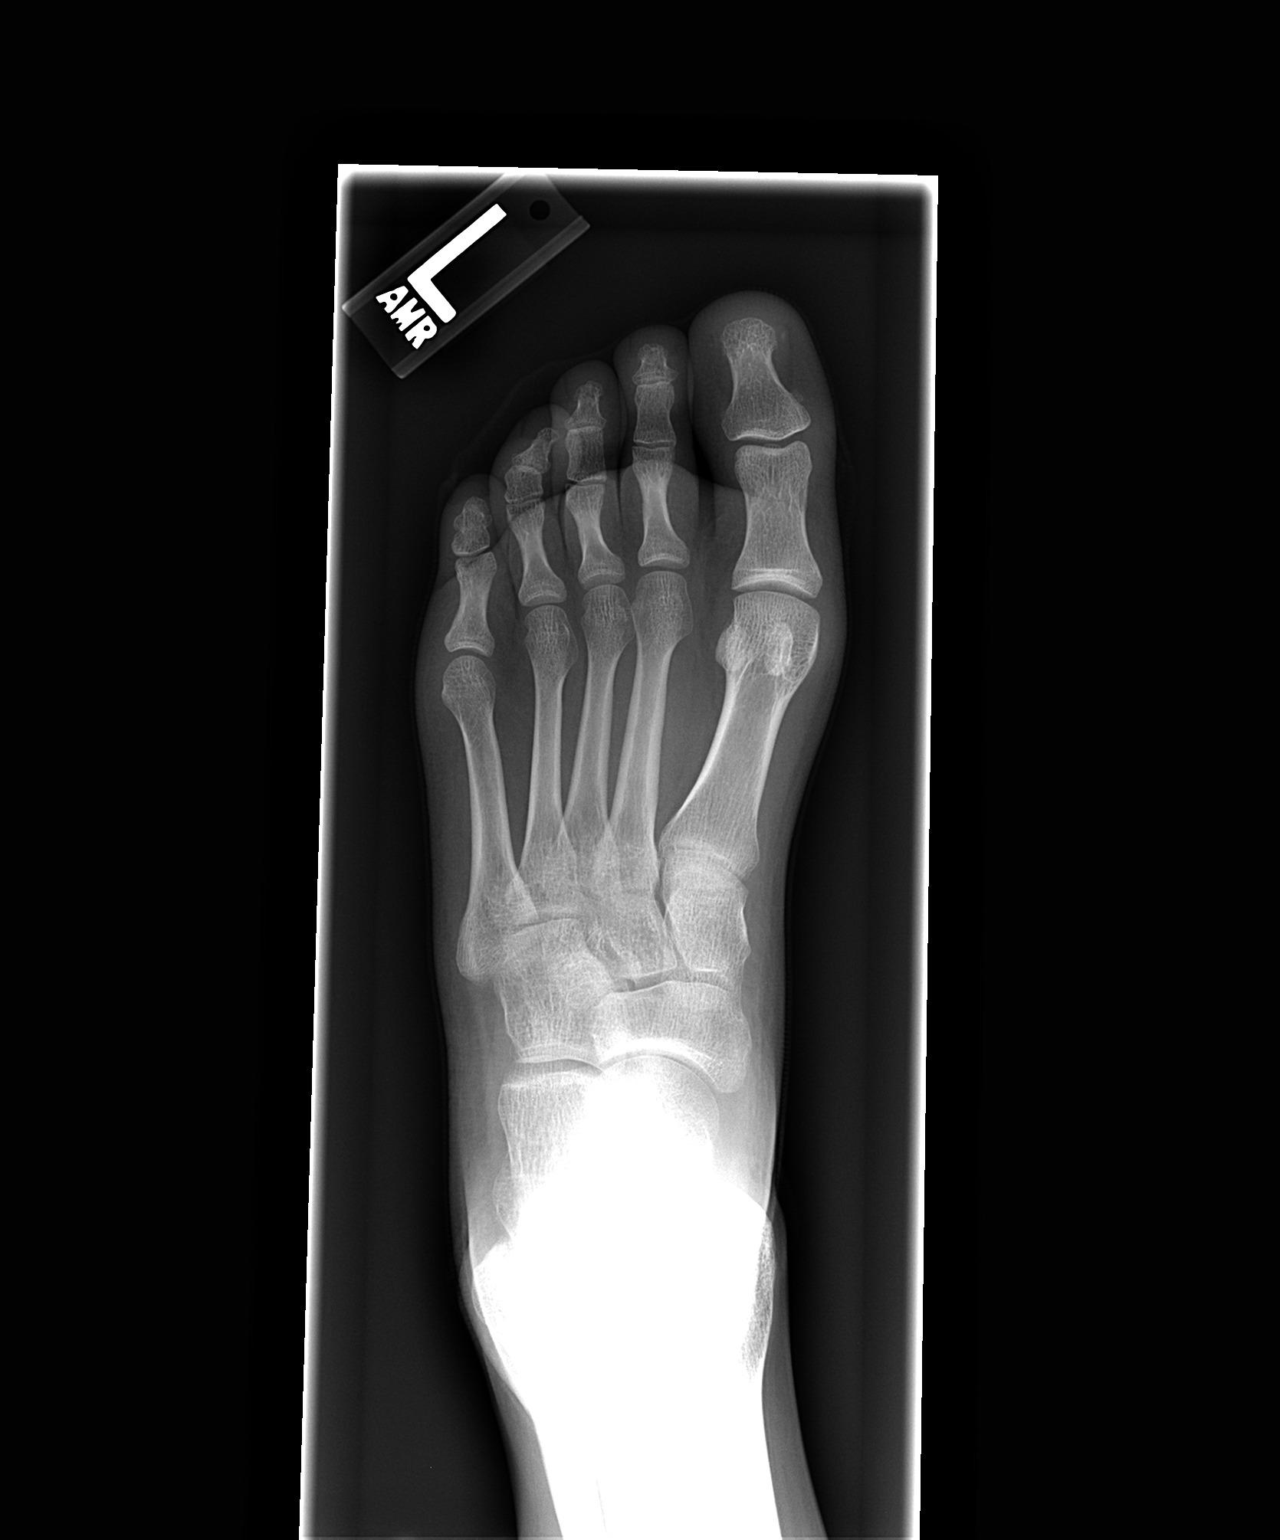

[view not recorded (2 of 3)]
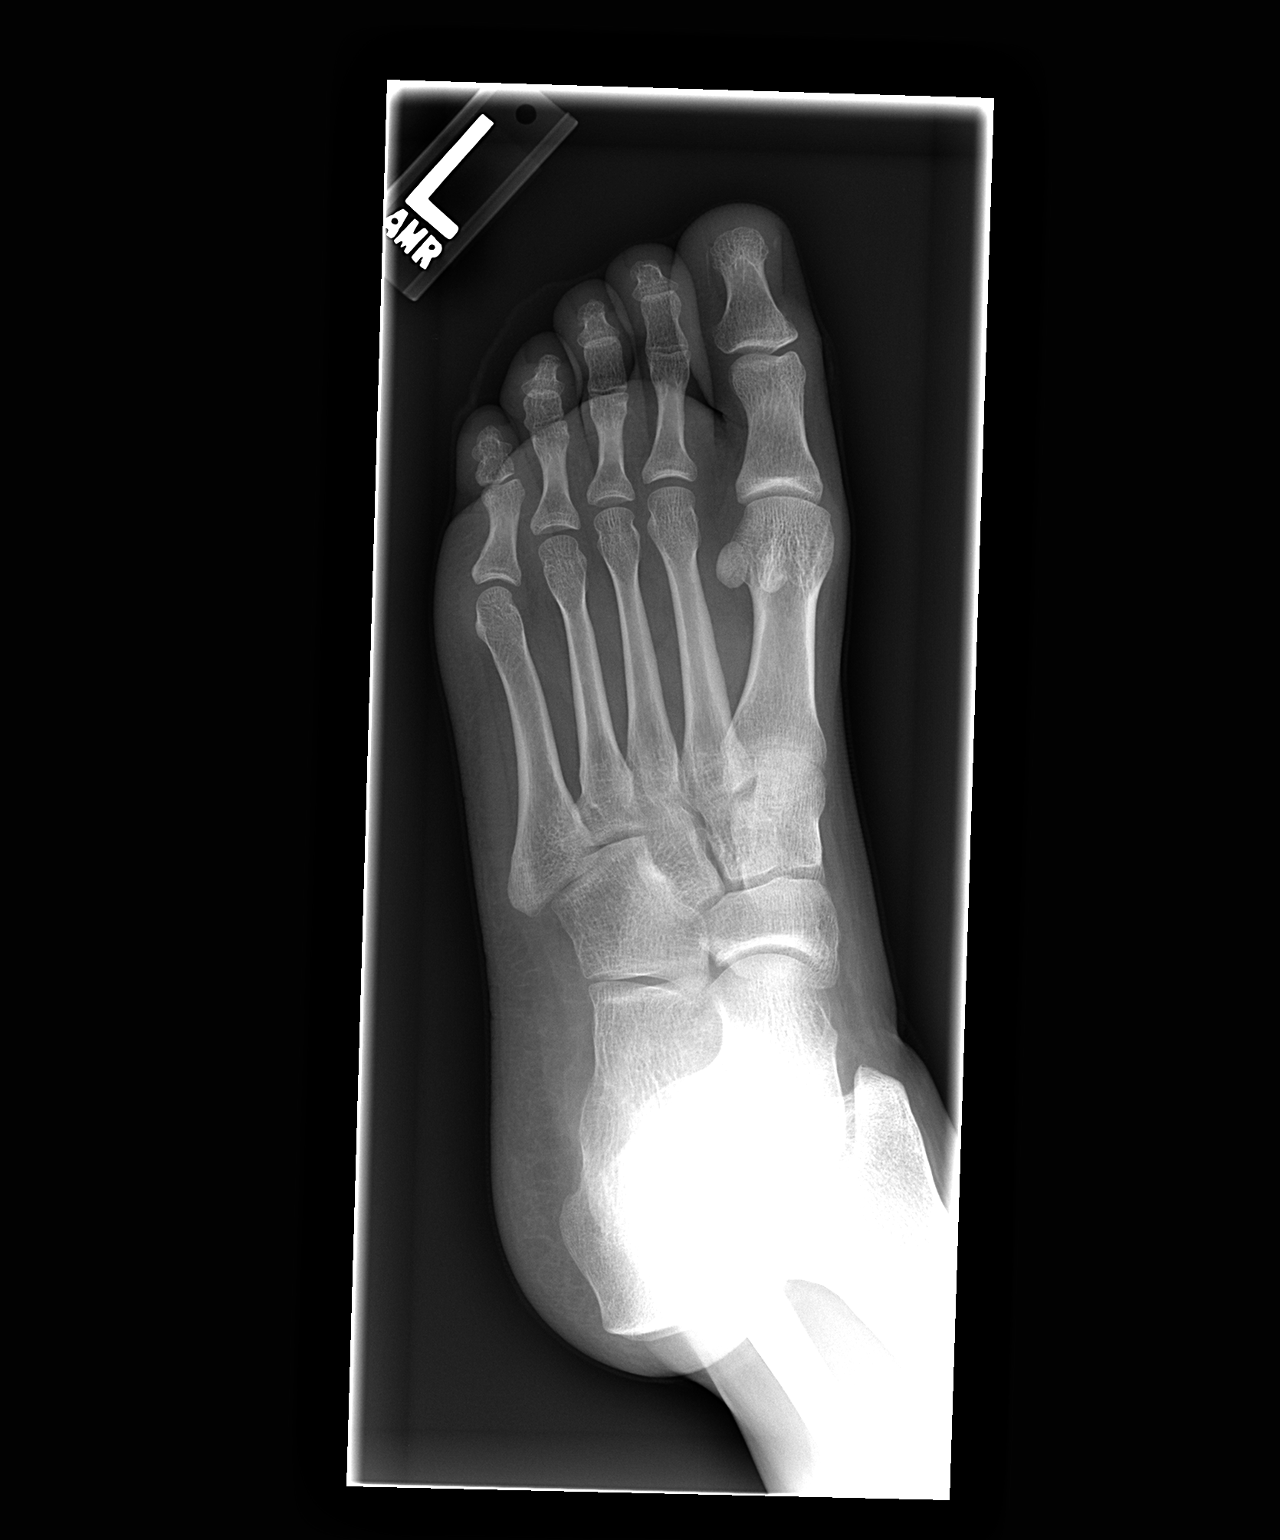

[view not recorded (3 of 3)]
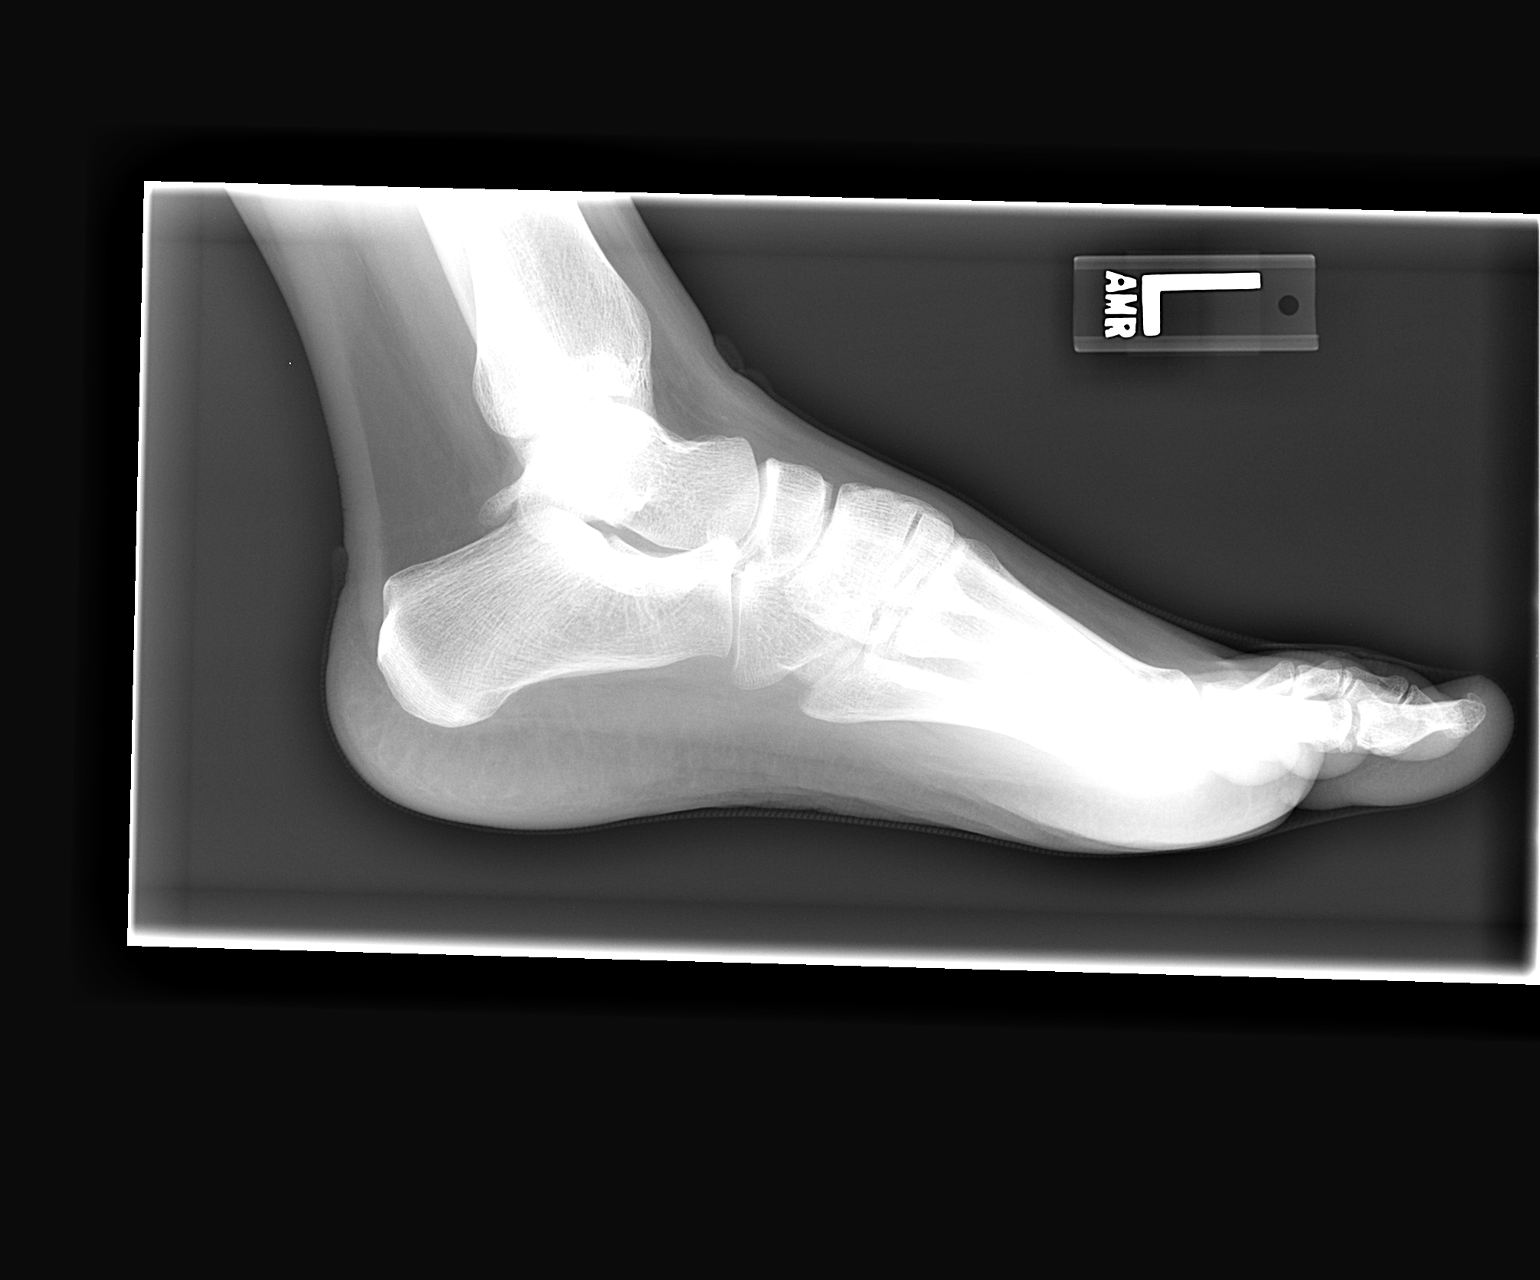

[3 of 3 positions shown; findings below may reference images not displayed]

FINDINGS: The joint spaces are maintained.  No fractures are seen.
No radiopaque foreign body.
IMPRESSION: No acute bony findings or radiopaque foreign body.

## 2012-04-13 ENCOUNTER — Telehealth: Payer: Self-pay | Admitting: Obstetrics and Gynecology

## 2012-04-13 ENCOUNTER — Encounter (HOSPITAL_COMMUNITY): Payer: Self-pay | Admitting: *Deleted

## 2012-04-13 ENCOUNTER — Inpatient Hospital Stay (HOSPITAL_COMMUNITY)
Admission: AD | Admit: 2012-04-13 | Discharge: 2012-04-13 | Disposition: A | Discharge status: Home / Self Care | Payer: Medicaid Other | Source: Ambulatory Visit | Attending: Obstetrics and Gynecology | Admitting: Obstetrics and Gynecology

## 2012-04-13 DIAGNOSIS — E860 Dehydration: Secondary | ICD-10-CM | POA: Insufficient documentation

## 2012-04-13 DIAGNOSIS — O212 Late vomiting of pregnancy: Secondary | ICD-10-CM | POA: Insufficient documentation

## 2012-04-13 DIAGNOSIS — O218 Other vomiting complicating pregnancy: Secondary | ICD-10-CM

## 2012-04-13 HISTORY — DX: Papillomavirus as the cause of diseases classified elsewhere: B97.7

## 2012-04-13 LAB — COMPREHENSIVE METABOLIC PANEL
ALT: 10 U/L (ref 0–35)
AST: 15 U/L (ref 0–37)
Albumin: 2.8 g/dL — ABNORMAL LOW (ref 3.5–5.2)
Alkaline Phosphatase: 63 U/L (ref 39–117)
Potassium: 4.1 mEq/L (ref 3.5–5.1)
Sodium: 136 mEq/L (ref 135–145)
Total Protein: 6 g/dL (ref 6.0–8.3)

## 2012-04-13 LAB — URINALYSIS, ROUTINE W REFLEX MICROSCOPIC
Ketones, ur: NEGATIVE mg/dL
Leukocytes, UA: NEGATIVE
Nitrite: NEGATIVE
Protein, ur: NEGATIVE mg/dL

## 2012-04-13 LAB — CBC
MCHC: 33.3 g/dL (ref 30.0–36.0)
Platelets: 339 10*3/uL (ref 150–400)
RDW: 14.4 % (ref 11.5–15.5)

## 2012-04-13 MED ORDER — LACTATED RINGERS IV BOLUS (SEPSIS)
500.0000 mL | Freq: Once | INTRAVENOUS | Status: AC
  Administered 2012-04-13: 1000 mL via INTRAVENOUS

## 2012-04-13 MED ORDER — ONDANSETRON 8 MG/NS 50 ML IVPB
8.0000 mg | Freq: Once | INTRAVENOUS | Status: AC
  Administered 2012-04-13: 8 mg via INTRAVENOUS
  Filled 2012-04-13: qty 8

## 2012-04-13 NOTE — Telephone Encounter (Signed)
Jackie/AR pt/ob

## 2012-04-13 NOTE — MAU Note (Signed)
C/o N&V,sweating and achy in lower back and lower abd since this AM;  C/o intermittent headaches; is on Procardia at home but not bedrest;

## 2012-04-13 NOTE — MAU Provider Note (Signed)
History     CSN: [id number]  Arrival date and time: 04/13/12 1300 with hx of nausea  Since yesterday after road trip to [place]. Had one episode of Vomiting this am and no further episodes. No change vaginal secretion and no contraction FM +  Had hx of PT contraction with Cx change x 2 - weeks ago. Now taking Procardia 10 mgs po Q 6 hrly PRN.    None     Chief Complaint  Patient presents with  . Nausea   HPI  OB History    Grav Para Term Preterm Abortions TAB SAB Ect Mult Living   2 1 1       1       Past Medical History  Diagnosis Date  . Yeast infection   . BV (bacterial vaginosis)   . Normal pregnancy, incidental 12/23/2011  . Preterm labor   . HPV (human papilloma virus) infection     Past Surgical History  Procedure Date  . Cesarean section   . Tooth extraction 2009    ONE TOOTH  . Foot surgery 2002    STEPPED ON SEWING NEEDLE    Family History  Problem Relation Age of Onset  . Diabetes Father     ORAL MEDS  . Cancer Paternal Aunt     BREAST;LIVER  . Diabetes Paternal Aunt   . Diabetes Paternal Uncle   . COPD Paternal Uncle   . Cancer Maternal Grandmother     BREAST  . COPD Mother     EMPHYSEMA  . Diabetes Paternal Grandfather   . Other Neg Hx     History  Substance Use Topics  . Smoking status: Current Everyday Smoker -- 0.2 packs/day for 12 years    Types: Cigarettes  . Smokeless tobacco: Never Used  . Alcohol Use: No    Allergies:  Allergies  Allergen Reactions  . Chocolate Other (See Comments)    DARK only ------gets bad case of acne  . Baby Oil Rash  . Morphine And Related Rash    Prescriptions prior to admission  Medication Sig Dispense Refill  . famotidine (PEPCID) 20 MG tablet Take 20 mg by mouth daily as needed.      Marland Kitchen HYDROcodone-acetaminophen (NORCO/VICODIN) 5-325 MG per tablet Take 1 tablet by mouth every 6 (six) hours as needed. Pt takes 1/2 tablet as needed forpain.      Marland Kitchen NIFEdipine (PROCARDIA) 10 MG capsule Take  10 mg by mouth 2 (two) times daily as needed.      . Prenatal Vit-Fe Fumarate-FA (PRENATAL MULTIVITAMIN) TABS Take 1 tablet by mouth daily.        ROS Physical Exam   Blood pressure 115/69, pulse 94, resp. rate 16, last menstrual period 09/13/2011.  Physical Exam  Constitutional: She is oriented to person, place, and time. She appears well-developed.  HENT:  Head: Normocephalic.  Eyes: Pupils are equal, round, and reactive to light.  Neck: Normal range of motion.  Cardiovascular: Normal rate.   Respiratory: Effort normal.  GI: Soft. Bowel sounds are normal.  Genitourinary:       Hx of PT contraction x 2 - 3 weeks ago. Taking  Procardia 10 mgs po q 6 hrly PRN  Musculoskeletal: Normal range of motion.  Neurological: She is alert and oriented to person, place, and time. She has normal reflexes.  Skin: Skin is warm.  Psychiatric: She has a normal mood and affect.    MAU Course  Procedures  IV Hydration LR IV  Zofran 8mg s x 1  U/a Mirco  PO Challenge   Assessment and Plan  Nausea and Vomiting. Dehydration. Possible ingestion of food at caused GI disturbance. Fluid challenge tolerated well and ate crackers. States "Feels better". SVE deferred as per patient request. Patient asked re intercourse and the patient has been advised to have pelvic rest as intercourse will cause further Uterine contractions. D/C to home and f/u in office in am 04/14/12 (Scheduled)  Spike Desilets, CNM. 04/13/2012, 2:10 PM

## 2012-04-13 NOTE — Telephone Encounter (Signed)
Spoke to pt who calls stating she woke up feeling just horrible today. She cannot keep anything down, she has H/A, is light-headed and dizzy. She has body aches, low back pain, breaking out in sweats, although she states she has no fever.  The baby is also not as active. I consulted with Geraldine Contras who is on call at [facility] and she states to tell pt to report to MAU for IV hydration and to R/O UTI. Pt is agreeable and will go now. Melody Comas A

## 2012-04-14 ENCOUNTER — Ambulatory Visit (INDEPENDENT_AMBULATORY_CARE_PROVIDER_SITE_OTHER): Payer: Medicaid Other | Admitting: Obstetrics and Gynecology

## 2012-04-14 VITALS — BP 110/60 | Wt 120.0 lb

## 2012-04-14 DIAGNOSIS — Z331 Pregnant state, incidental: Secondary | ICD-10-CM

## 2012-04-14 NOTE — Progress Notes (Signed)
Requests cx check.

## 2012-04-14 NOTE — Progress Notes (Signed)
[redacted]w[redacted]d 1 hr Glucola normal Seen yesterday at [facility] myalgia, nausea, vomiting. Improved by IV fluids. Using Procardia 10 mg PRN for contractions. 3 Irreg ctx <6/hr Previous c/s: Desires repeat c/s at 39 weeks

## 2012-04-14 NOTE — Progress Notes (Signed)
Pt stated still having contractions with difficulty to use the restroom with pain/pressure.  Pt stated she will leave a urine sample before she leaves.

## 2012-04-28 ENCOUNTER — Telehealth: Payer: Self-pay | Admitting: Obstetrics and Gynecology

## 2012-04-28 ENCOUNTER — Ambulatory Visit (INDEPENDENT_AMBULATORY_CARE_PROVIDER_SITE_OTHER): Payer: Medicaid Other | Admitting: Obstetrics and Gynecology

## 2012-04-28 VITALS — BP 110/54 | Wt 124.0 lb

## 2012-04-28 DIAGNOSIS — Z331 Pregnant state, incidental: Secondary | ICD-10-CM

## 2012-04-28 DIAGNOSIS — N898 Other specified noninflammatory disorders of vagina: Secondary | ICD-10-CM

## 2012-04-28 NOTE — Progress Notes (Signed)
Patient ID: Sophia Garcia, female   DOB: 02/26/1986, 26 y.o.   MRN: [id number] [redacted]w[redacted]d C/o of pain like I can not sleep on this side hurts steady for a day, denies lifting heavy things or exercise change, no lump. Denies vaginal irritation. Discussed heat or ice to underarm report if changes or unresolved. Bilateral beast exam no lumps dimpling or drainage, area of discomfort is above L breast to underarm. Reviewed s/s preterm labor, srom, vag bleeding,daily kick counts to report, encouraged 8 water daily and frequent voids. Sophia Garcia, CNM

## 2012-04-28 NOTE — Progress Notes (Signed)
Pt c/o:pain on left side and and left shoulder area. Aches and pains every where.  Pt wants to be sure yeast infection is gone.

## 2012-04-30 ENCOUNTER — Telehealth: Payer: Self-pay | Admitting: Obstetrics and Gynecology

## 2012-04-30 ENCOUNTER — Other Ambulatory Visit: Payer: Self-pay | Admitting: Obstetrics and Gynecology

## 2012-04-30 NOTE — Telephone Encounter (Signed)
Repeat C/S Scheduled for 06/12/12 @ 9:00 with AVS/CNM. -Adrianne Pridgen

## 2012-04-30 NOTE — Telephone Encounter (Signed)
Repeat C/S Scheduled for 06/12/12 @ 12:15 with AVS/CNM. -Adrianne Pridgen

## 2012-05-06 ENCOUNTER — Telehealth: Payer: Self-pay | Admitting: Obstetrics and Gynecology

## 2012-05-06 NOTE — Telephone Encounter (Signed)
TC TO PT REGARDING MESSAGE. PT WANTED TO KNOW WHAT TO TAKE FOR COUGH OR COLD. PT TRIED ROBITUSSIN BUT IT  IS NOT WORKING. INFORMED PT TO TRY PLAIN SUDAFED OR PLAIN MUCINEX TO SEE IF THEY WOULD GIVE PT RELIEF. ALSO,TOLD PT SHE CAN TRY DELSYM FOR COUGH. PT STATES SHE WILL TRY THE PLAIN SUDAFED AND SEE WHAT HAPPENS. INFORMED PT IF SHE DOES NOT GET RELIEF TO CALL us BACK. PT VOICED UNDERSTANDING.

## 2012-05-06 NOTE — Telephone Encounter (Signed)
TRIAGE/OB/GEN QUEST °

## 2012-05-14 ENCOUNTER — Ambulatory Visit (INDEPENDENT_AMBULATORY_CARE_PROVIDER_SITE_OTHER): Payer: Medicaid Other | Admitting: Obstetrics and Gynecology

## 2012-05-14 VITALS — BP 100/62 | Wt 129.0 lb

## 2012-05-14 DIAGNOSIS — Z331 Pregnant state, incidental: Secondary | ICD-10-CM

## 2012-05-14 NOTE — Progress Notes (Signed)
Pt stated that her right foot sometimes feels  like she is walking on pins and needles . Pt stated no other issues .

## 2012-05-14 NOTE — Progress Notes (Signed)
[redacted]w[redacted]d  GFM Previous cesarean section: desires repeat c/s with BTL papers not signed yet, will sign today, informed of 30 day requirement BTL reviewed with R&B

## 2012-05-18 NOTE — Addendum Note (Signed)
Addended by: Janine Limbo on: 05/18/2012 05:28 PM   Modules accepted: Orders

## 2012-05-21 ENCOUNTER — Ambulatory Visit (INDEPENDENT_AMBULATORY_CARE_PROVIDER_SITE_OTHER): Payer: Medicaid Other | Admitting: Obstetrics and Gynecology

## 2012-05-21 VITALS — BP 100/62 | Wt 132.0 lb

## 2012-05-21 DIAGNOSIS — Z331 Pregnant state, incidental: Secondary | ICD-10-CM

## 2012-05-21 NOTE — Progress Notes (Signed)
[redacted]w[redacted]d Beta strep today. Normal liver enzymes last month. Return office in 1 week. Dr. Stefano Gaul

## 2012-05-21 NOTE — Progress Notes (Signed)
C/O:Increased pressure. Pt states discharge is not bothering her but notices an odor.   Pt requesting blood work for cholestasis.

## 2012-05-21 NOTE — Addendum Note (Signed)
Addended by: Darien Ramus on: 05/21/2012 11:37 AM   Modules accepted: Orders

## 2012-05-23 LAB — STREP B DNA PROBE: GBSP: POSITIVE

## 2012-05-25 DIAGNOSIS — Z331 Pregnant state, incidental: Secondary | ICD-10-CM | POA: Insufficient documentation

## 2012-05-28 ENCOUNTER — Ambulatory Visit (INDEPENDENT_AMBULATORY_CARE_PROVIDER_SITE_OTHER): Payer: Medicaid Other | Admitting: Obstetrics and Gynecology

## 2012-05-28 ENCOUNTER — Encounter: Payer: Self-pay | Admitting: Obstetrics and Gynecology

## 2012-05-28 VITALS — BP 110/64 | Wt 133.0 lb

## 2012-05-28 DIAGNOSIS — Z331 Pregnant state, incidental: Secondary | ICD-10-CM

## 2012-05-28 NOTE — Progress Notes (Signed)
Patient ID: Sophia Garcia, female   DOB: 13-Oct-1985, 26 y.o.   MRN: [id number] [redacted]w[redacted]d GBS+ discussed. Reviewed s/s uc, srom, vag bleeding, daily fetal kick counts to report, comfort measures. Encouragged 8 water daily and frequent voids. Lavera Guise, CNM

## 2012-05-28 NOTE — Progress Notes (Signed)
[redacted]w[redacted]d GBS + REQUEST CERVIX CHECK.

## 2012-05-30 ENCOUNTER — Encounter (HOSPITAL_COMMUNITY): Payer: Self-pay | Admitting: Pharmacist

## 2012-06-04 ENCOUNTER — Encounter (HOSPITAL_COMMUNITY): Payer: Self-pay

## 2012-06-04 ENCOUNTER — Telehealth: Payer: Self-pay | Admitting: Obstetrics and Gynecology

## 2012-06-04 ENCOUNTER — Ambulatory Visit (INDEPENDENT_AMBULATORY_CARE_PROVIDER_SITE_OTHER): Payer: Medicaid Other | Admitting: Obstetrics and Gynecology

## 2012-06-04 ENCOUNTER — Encounter (HOSPITAL_COMMUNITY)
Admission: RE | Admit: 2012-06-04 | Discharge: 2012-06-04 | Disposition: A | Discharge status: Home / Self Care | Payer: Medicaid Other | Source: Ambulatory Visit | Attending: Obstetrics and Gynecology | Admitting: Obstetrics and Gynecology

## 2012-06-04 VITALS — BP 110/62 | Wt 133.0 lb

## 2012-06-04 DIAGNOSIS — Z331 Pregnant state, incidental: Secondary | ICD-10-CM

## 2012-06-04 DIAGNOSIS — Z3490 Encounter for supervision of normal pregnancy, unspecified, unspecified trimester: Secondary | ICD-10-CM

## 2012-06-04 DIAGNOSIS — Z23 Encounter for immunization: Secondary | ICD-10-CM

## 2012-06-04 LAB — TYPE AND SCREEN: ABO/RH(D): O POS

## 2012-06-04 LAB — CBC
HCT: 37.4 % (ref 36.0–46.0)
Hemoglobin: 12.9 g/dL (ref 12.0–15.0)
RDW: 14.4 % (ref 11.5–15.5)
WBC: 9.9 10*3/uL (ref 4.0–10.5)

## 2012-06-04 LAB — ABO/RH: ABO/RH(D): O POS

## 2012-06-04 LAB — RPR: RPR Ser Ql: NONREACTIVE

## 2012-06-04 LAB — SURGICAL PCR SCREEN
MRSA, PCR: NEGATIVE
Staphylococcus aureus: NEGATIVE

## 2012-06-04 NOTE — Progress Notes (Signed)
[redacted]w[redacted]d No complaints FKCs and Labor Precautions RTO 1wk C/s planned in 8 days

## 2012-06-04 NOTE — Patient Instructions (Addendum)
20 Sophia Garcia  06/04/2012   Your procedure is scheduled on:  06/12/12  Enter through the Main Entrance of [facility] at 930 AM.  Pick up the phone at the desk and dial [phone].   Call this number if you have problems the morning of surgery: [phone]   Remember:   Do not eat food:After Midnight.  Do not drink clear liquids: After Midnight.  Take these medicines the morning of surgery with A SIP OF WATER: NA   Do not wear jewelry, make-up or nail polish.  Do not wear lotions, powders, or perfumes. You may wear deodorant.  Do not shave 48 hours prior to surgery.  Do not bring valuables to the hospital.  Contacts, dentures or bridgework may not be worn into surgery.  Leave suitcase in the car. After surgery it may be brought to your room.  For patients admitted to the hospital, checkout time is 11:00 AM the day of discharge.   Patients discharged the day of surgery will not be allowed to drive home.  Name and phone number of your driver: NA  Special Instructions: Shower using CHG 2 nights before surgery and the night before surgery.  If you shower the day of surgery use CHG.  Use special wash - you have one bottle of CHG for all showers.  You should use approximately 1/3 of the bottle for each shower.   Please read over the following fact sheets that you were given: MRSA Information

## 2012-06-04 NOTE — Telephone Encounter (Signed)
Repeat C/S Scheduled for 06/12/12 @ 11:00 with AVS/CHS. -Adrianne Pridgen

## 2012-06-06 ENCOUNTER — Inpatient Hospital Stay (HOSPITAL_COMMUNITY)
Admission: AD | Admit: 2012-06-06 | Discharge: 2012-06-06 | Disposition: A | Discharge status: Home / Self Care | Payer: Medicaid Other | Source: Ambulatory Visit | Attending: Obstetrics and Gynecology | Admitting: Obstetrics and Gynecology

## 2012-06-06 ENCOUNTER — Encounter (HOSPITAL_COMMUNITY): Payer: Self-pay | Admitting: *Deleted

## 2012-06-06 DIAGNOSIS — O479 False labor, unspecified: Secondary | ICD-10-CM | POA: Insufficient documentation

## 2012-06-06 MED ORDER — OXYCODONE-ACETAMINOPHEN 5-325 MG PO TABS
2.0000 | ORAL_TABLET | Freq: Once | ORAL | Status: AC
  Administered 2012-06-06: 2 via ORAL
  Filled 2012-06-06: qty 2

## 2012-06-06 MED ORDER — ONDANSETRON 4 MG PO TBDP
4.0000 mg | ORAL_TABLET | Freq: Once | ORAL | Status: AC
  Administered 2012-06-06: 4 mg via ORAL
  Filled 2012-06-06: qty 1

## 2012-06-06 MED ORDER — HYDROXYZINE HCL 50 MG PO TABS
50.0000 mg | ORAL_TABLET | Freq: Once | ORAL | Status: AC
  Administered 2012-06-06: 50 mg via ORAL
  Filled 2012-06-06: qty 1

## 2012-06-06 NOTE — MAU Provider Note (Signed)
History    Sophia Garcia called at 05.00 hrs with history of contractions that had kept her awake all throughout the night. Has presented to MAU for evaluation of contractions for progress in labor. Has had an episode of Threatened PTL at 32 weeks but continued Pregnancy without any further event History of PTD at 37 weeks on first Pregnancy. Please note that the patient is allergic to Morphine. Her reaction is a body rash.  CSN: [id number]  Arrival date and time: 06/06/12 2956   None     Chief Complaint  Patient presents with  . Labor Eval   HPI  OB History    Grav Para Term Preterm Abortions TAB SAB Ect Mult Living   2 1 1       1       Past Medical History  Diagnosis Date  . Yeast infection   . BV (bacterial vaginosis)   . Normal pregnancy, incidental 12/23/2011  . Preterm labor   . HPV (human papilloma virus) infection   . GERD (gastroesophageal reflux disease)     Past Surgical History  Procedure Date  . Cesarean section   . Tooth extraction 2009    ONE TOOTH  . Foot surgery 2002    STEPPED ON SEWING NEEDLE    Family History  Problem Relation Age of Onset  . Diabetes Father     ORAL MEDS  . Cancer Paternal Aunt     BREAST;LIVER  . Diabetes Paternal Aunt   . Diabetes Paternal Uncle   . COPD Paternal Uncle   . Cancer Maternal Grandmother     BREAST  . COPD Mother     EMPHYSEMA  . Diabetes Paternal Grandfather   . Other Neg Hx     History  Substance Use Topics  . Smoking status: Current Every Day Smoker -- 0.2 packs/day for 12 years    Types: Cigarettes  . Smokeless tobacco: Never Used  . Alcohol Use: No    Allergies:  Allergies  Allergen Reactions  . Baby Oil Rash  . Morphine And Related Rash    Prescriptions prior to admission  Medication Sig Dispense Refill  . cyclobenzaprine (FLEXERIL) 10 MG tablet Take 5-10 mg by mouth at bedtime as needed. For muscle spasm      . famotidine (PEPCID) 20 MG tablet Take 20 mg by mouth daily as needed.       . Prenatal Vit-Fe Fumarate-FA (PRENATAL MULTIVITAMIN) TABS Take 1 tablet by mouth daily.      Marland Kitchen NIFEdipine (PROCARDIA) 10 MG capsule Take 10 mg by mouth 2 (two) times daily as needed.        Review of Systems  Constitutional: Negative.   HENT: Negative.   Eyes: Negative.   Respiratory: Negative.   Cardiovascular: Negative.   Gastrointestinal: Negative.   Genitourinary: Negative.   Musculoskeletal: Negative.   Skin: Negative.   Neurological: Negative.   Endo/Heme/Allergies: Negative.   Psychiatric/Behavioral: Negative.    Physical Exam   Blood pressure 102/59, pulse 89, temperature 97 F (36.1 C), temperature source Oral, resp. rate 20, height 4\' 11"  (1.499 m), weight 134 lb (60.782 kg), last menstrual period 09/13/2011, SpO2 99.00%.  Physical Exam  Constitutional: She is oriented to person, place, and time. She appears well-developed and well-nourished.  HENT:  Head: Normocephalic and atraumatic.  Eyes: Conjunctivae normal and EOM are normal. Pupils are equal, round, and reactive to light.  Neck: Normal range of motion. Neck supple.  Cardiovascular: Normal rate, regular rhythm and normal heart  sounds.   Respiratory: Effort normal and breath sounds normal.  GI: Soft. Bowel sounds are normal.  Genitourinary: Vagina normal and uterus normal.       Regular contractions overnight.  Musculoskeletal: Normal range of motion.  Neurological: She is alert and oriented to person, place, and time. She has normal reflexes.  Skin: Skin is warm and dry.  Psychiatric: She has a normal mood and affect.    MAU Course  Procedures EFM and SVE to assess for labor.  Assessment and Plan  EFM - baseline 140 bpm Cat 1 tracing. SVE: 0.5/ 50%/0, medium and anterior position. Not in labor. To remains for an hour and can re- evaluate for Cx change.  Alysse Rathe, CNM. 06/06/2012, 6:46 AM

## 2012-06-06 NOTE — MAU Provider Note (Signed)
S:  Pt reports her contractions are now less frequent than earlier but still painful and ranks as 8/10 on pain scale.  Denies ROM or bldg.  Reports active fetus.  Reports feeling slightly more relaxed after vistaril.   OCeasar Mons Vitals:   06/06/12 0633 06/06/12 0752  BP: 102/59 109/64  Pulse: 89 90  Temp: 97 F (36.1 C)   TempSrc: Oral   Resp: 20 18  Height: 4\' 11"  (1.499 m)   Weight: 134 lb (60.782 kg)   SpO2: 99%    FHR baseline 130bpm Variability: Moderate Accels:  Present Decels: Absent FHR Cat 1  UCs by toco every 8-9 mins, decreased from 4-5.  Mod to palpation.  SVE: 0.5cm dilation; thick; -3; vtx (unchanged from previous exam)  A:  IUP at 38w 1d      False Labor      Previous C/S  P:  Discharge to home.      Percocet 5/325mg  x 2 tabs given to augment previous Vistaril for therapeutic rest.  Pt states she can take Percocet without difficulty.      Revd s/s labor and fetal kick counts.      F/U as scheduled at [facility] on Thursday, 06/11/12.  Elsie Ra, CNM

## 2012-06-11 ENCOUNTER — Encounter: Payer: Self-pay | Admitting: Obstetrics and Gynecology

## 2012-06-11 ENCOUNTER — Telehealth: Payer: Self-pay | Admitting: Obstetrics and Gynecology

## 2012-06-11 ENCOUNTER — Ambulatory Visit (INDEPENDENT_AMBULATORY_CARE_PROVIDER_SITE_OTHER): Payer: Medicaid Other | Admitting: Obstetrics and Gynecology

## 2012-06-11 VITALS — BP 116/62 | Wt 136.0 lb

## 2012-06-11 DIAGNOSIS — Z98891 History of uterine scar from previous surgery: Secondary | ICD-10-CM

## 2012-06-11 DIAGNOSIS — Z331 Pregnant state, incidental: Secondary | ICD-10-CM

## 2012-06-11 DIAGNOSIS — Z9889 Other specified postprocedural states: Secondary | ICD-10-CM

## 2012-06-11 DIAGNOSIS — O99320 Drug use complicating pregnancy, unspecified trimester: Secondary | ICD-10-CM

## 2012-06-11 DIAGNOSIS — F1910 Other psychoactive substance abuse, uncomplicated: Secondary | ICD-10-CM

## 2012-06-11 DIAGNOSIS — IMO0002 Reserved for concepts with insufficient information to code with codable children: Secondary | ICD-10-CM

## 2012-06-11 MED ORDER — LACTATED RINGERS IV SOLN: Freq: Once | INTRAVENOUS | Status: DC

## 2012-06-11 MED ORDER — SCOPOLAMINE 1 MG/3DAYS TD PT72: 1.0000 | MEDICATED_PATCH | Freq: Once | TRANSDERMAL | Status: DC

## 2012-06-11 NOTE — Telephone Encounter (Signed)
The patient called at home.  Husband answers the telephone.  Patient is not at home at the moment.  They are ready for surgery.  Dr. Stefano Gaul

## 2012-06-11 NOTE — H&P (Signed)
Admission History and Physical Exam for an Obstetrics Patient  Sophia Garcia is a 26 y.o. female, G2P1001, at [redacted] weeks gestation, who presents for a repeat cesarean section. She has been followed at the [facility] and Gy[facility]cology division of [facility].  Her pregnancy has been complicated by cigarette smoking, and a history of preterm contractions. See history below.  OB History    Grav Para Term Preterm Abortions TAB SAB Ect Mult Living   2 1 1       1       Past Medical History  Diagnosis Date  . Yeast infection   . BV (bacterial vaginosis)   . Normal pregnancy, incidental 12/23/2011  . Preterm labor   . HPV (human papilloma virus) infection   . GERD (gastroesophageal reflux disease)     No prescriptions prior to admission    Past Surgical History  Procedure Date  . Cesarean section   . Tooth extraction 2009    ONE TOOTH  . Foot surgery 2002    STEPPED ON SEWING NEEDLE    Allergies  Allergen Reactions  . Latex   . Baby Oil Rash  . Morphine And Related Rash    Family History: family history includes COPD in her mother and paternal uncle; Cancer in her maternal grandmother and paternal aunt; and Diabetes in her father, paternal aunt, paternal grandfather, and paternal uncle.  There is no history of Other.  Social History:  reports that she has been smoking Cigarettes.  She has a 3 pack-year smoking history. She has never used smokeless tobacco. She reports that she does not drink alcohol or use illicit drugs.  Review of systems: Normal pregnancy complaints.  Admission Physical Exam:    Body mass index is 26.66 kg/(m^2).  Height 4\' 11"  (1.499 m), weight 132 lb (59.875 kg), last menstrual period 09/13/2011.  HEENT:                 Within normal limits Chest:                   Clear Heart:                    Regular rate and rhythm Abdomen:             Gravid and nontender Extremities:          Grossly normal Neurologic exam:  Grossly normal Pelvic exam:         Cervix: closed and long  Prenatal labs: ABO, Rh:             --/--/O POS, O POS (10/17 1511) Antibody:              NEG (10/17 1511) Rubella:                immune RPR:                    NON REACTIVE (10/17 1511)  HBsAg:                 NEGATIVE (04/16 1625)  HIV:                       NON REACTIVE (04/16 1625)  GBS:                     POSITIVE (10/03 1159)   Assessment:  [redacted] weeks gestation  Prior cesarean section  Desires repeat cesarean section  Cigarette smoker  Plan:  The patient will undergo a repeat low transverse cesarean section.   Janine Limbo 06/11/2012, 7:34 PM

## 2012-06-11 NOTE — Progress Notes (Signed)
38w 6 days. Scheduled for C/S tomorrow. Pt has had flu vaccine

## 2012-06-12 ENCOUNTER — Encounter (HOSPITAL_COMMUNITY): Payer: Self-pay | Admitting: Anesthesiology

## 2012-06-12 ENCOUNTER — Inpatient Hospital Stay (HOSPITAL_COMMUNITY)
Admission: RE | Admit: 2012-06-12 | Discharge: 2012-06-14 | DRG: 766 | Disposition: A | Discharge status: Home / Self Care | Payer: Medicaid Other | Source: Ambulatory Visit | Attending: Obstetrics and Gynecology | Admitting: Obstetrics and Gynecology

## 2012-06-12 ENCOUNTER — Inpatient Hospital Stay (HOSPITAL_COMMUNITY): Payer: Medicaid Other | Admitting: Anesthesiology

## 2012-06-12 ENCOUNTER — Encounter (HOSPITAL_COMMUNITY): Payer: Self-pay | Admitting: *Deleted

## 2012-06-12 ENCOUNTER — Encounter (HOSPITAL_COMMUNITY)
Admission: RE | Disposition: A | Discharge status: Home / Self Care | Payer: Self-pay | Source: Ambulatory Visit | Attending: Obstetrics and Gynecology

## 2012-06-12 DIAGNOSIS — D649 Anemia, unspecified: Secondary | ICD-10-CM | POA: Diagnosis not present

## 2012-06-12 DIAGNOSIS — O34219 Maternal care for unspecified type scar from previous cesarean delivery: Secondary | ICD-10-CM

## 2012-06-12 DIAGNOSIS — O9903 Anemia complicating the puerperium: Secondary | ICD-10-CM | POA: Diagnosis not present

## 2012-06-12 DIAGNOSIS — O99334 Smoking (tobacco) complicating childbirth: Secondary | ICD-10-CM | POA: Diagnosis present

## 2012-06-12 DIAGNOSIS — Z302 Encounter for sterilization: Secondary | ICD-10-CM

## 2012-06-12 DIAGNOSIS — Z98891 History of uterine scar from previous surgery: Secondary | ICD-10-CM | POA: Diagnosis not present

## 2012-06-12 SURGERY — Surgical Case
Anesthesia: Spinal | Wound class: Clean Contaminated

## 2012-06-12 MED ORDER — DIBUCAINE 1 % RE OINT: 1.0000 "application " | TOPICAL_OINTMENT | RECTAL | Status: DC | PRN

## 2012-06-12 MED ORDER — IBUPROFEN 600 MG PO TABS
600.0000 mg | ORAL_TABLET | Freq: Four times a day (QID) | ORAL | Status: DC
  Administered 2012-06-13 – 2012-06-14 (×4): 600 mg via ORAL
  Filled 2012-06-12 (×4): qty 1

## 2012-06-12 MED ORDER — OXYTOCIN 10 UNIT/ML IJ SOLN
40.0000 [IU] | INTRAVENOUS | Status: DC | PRN
  Administered 2012-06-12: 40 [IU] via INTRAVENOUS

## 2012-06-12 MED ORDER — MEDROXYPROGESTERONE ACETATE 150 MG/ML IM SUSP: 150.0000 mg | INTRAMUSCULAR | Status: DC | PRN

## 2012-06-12 MED ORDER — PRENATAL MULTIVITAMIN CH: 1.0000 | ORAL_TABLET | Freq: Every day | ORAL | Status: DC

## 2012-06-12 MED ORDER — LANOLIN HYDROUS EX OINT: 1.0000 "application " | TOPICAL_OINTMENT | CUTANEOUS | Status: DC | PRN

## 2012-06-12 MED ORDER — ONDANSETRON HCL 4 MG/2ML IJ SOLN
INTRAMUSCULAR | Status: DC | PRN
  Administered 2012-06-12: 4 mg via INTRAVENOUS

## 2012-06-12 MED ORDER — SCOPOLAMINE 1 MG/3DAYS TD PT72
1.0000 | MEDICATED_PATCH | TRANSDERMAL | Status: DC
  Administered 2012-06-12: 1.5 mg via TRANSDERMAL

## 2012-06-12 MED ORDER — CEFAZOLIN SODIUM-DEXTROSE 2-3 GM-% IV SOLR
INTRAVENOUS | Status: AC
  Administered 2012-06-12: 2 g via INTRAVENOUS
  Filled 2012-06-12: qty 50

## 2012-06-12 MED ORDER — ONDANSETRON HCL 4 MG PO TABS: 4.0000 mg | ORAL_TABLET | ORAL | Status: DC | PRN

## 2012-06-12 MED ORDER — LACTATED RINGERS IV SOLN
INTRAVENOUS | Status: DC | PRN
  Administered 2012-06-12 (×3): via INTRAVENOUS

## 2012-06-12 MED ORDER — GLYCOPYRROLATE 0.2 MG/ML IJ SOLN
INTRAMUSCULAR | Status: AC
  Filled 2012-06-12: qty 1

## 2012-06-12 MED ORDER — PHENYLEPHRINE 40 MCG/ML (10ML) SYRINGE FOR IV PUSH (FOR BLOOD PRESSURE SUPPORT)
PREFILLED_SYRINGE | INTRAVENOUS | Status: AC
  Filled 2012-06-12: qty 5

## 2012-06-12 MED ORDER — ONDANSETRON HCL 4 MG/2ML IJ SOLN
INTRAMUSCULAR | Status: AC
  Filled 2012-06-12: qty 2

## 2012-06-12 MED ORDER — BUPIVACAINE IN DEXTROSE 0.75-8.25 % IT SOLN
INTRATHECAL | Status: DC | PRN
  Administered 2012-06-12: 1.1 mL via INTRATHECAL

## 2012-06-12 MED ORDER — DIPHENHYDRAMINE HCL 25 MG PO CAPS
25.0000 mg | ORAL_CAPSULE | Freq: Four times a day (QID) | ORAL | Status: DC | PRN
  Administered 2012-06-14: 25 mg via ORAL
  Filled 2012-06-12: qty 1

## 2012-06-12 MED ORDER — MORPHINE SULFATE 0.5 MG/ML IJ SOLN
INTRAMUSCULAR | Status: AC
  Filled 2012-06-12: qty 10

## 2012-06-12 MED ORDER — ONDANSETRON HCL 4 MG/2ML IJ SOLN: 4.0000 mg | INTRAMUSCULAR | Status: DC | PRN

## 2012-06-12 MED ORDER — ONDANSETRON HCL 4 MG/2ML IJ SOLN: 4.0000 mg | Freq: Three times a day (TID) | INTRAMUSCULAR | Status: DC | PRN

## 2012-06-12 MED ORDER — FENTANYL CITRATE 0.05 MG/ML IJ SOLN
INTRAMUSCULAR | Status: AC
  Administered 2012-06-12: 50 ug via INTRAVENOUS
  Filled 2012-06-12: qty 2

## 2012-06-12 MED ORDER — KETOROLAC TROMETHAMINE 30 MG/ML IJ SOLN
30.0000 mg | Freq: Four times a day (QID) | INTRAMUSCULAR | Status: AC | PRN
  Administered 2012-06-12 – 2012-06-13 (×2): 30 mg via INTRAVENOUS
  Filled 2012-06-12 (×2): qty 1

## 2012-06-12 MED ORDER — DIPHENHYDRAMINE HCL 50 MG/ML IJ SOLN: 12.5000 mg | Freq: Four times a day (QID) | INTRAMUSCULAR | Status: DC | PRN

## 2012-06-12 MED ORDER — SIMETHICONE 80 MG PO CHEW
80.0000 mg | CHEWABLE_TABLET | Freq: Three times a day (TID) | ORAL | Status: DC
  Administered 2012-06-12 – 2012-06-14 (×6): 80 mg via ORAL

## 2012-06-12 MED ORDER — WITCH HAZEL-GLYCERIN EX PADS: 1.0000 "application " | MEDICATED_PAD | CUTANEOUS | Status: DC | PRN

## 2012-06-12 MED ORDER — OXYCODONE-ACETAMINOPHEN 5-325 MG PO TABS
1.0000 | ORAL_TABLET | ORAL | Status: DC | PRN
  Administered 2012-06-13: 2 via ORAL
  Administered 2012-06-13 – 2012-06-14 (×4): 1 via ORAL
  Administered 2012-06-14: 2 via ORAL
  Administered 2012-06-14: 1 via ORAL
  Filled 2012-06-12: qty 1
  Filled 2012-06-12: qty 2
  Filled 2012-06-12: qty 1
  Filled 2012-06-12 (×2): qty 2
  Filled 2012-06-12 (×2): qty 1

## 2012-06-12 MED ORDER — SIMETHICONE 80 MG PO CHEW: 80.0000 mg | CHEWABLE_TABLET | ORAL | Status: DC | PRN

## 2012-06-12 MED ORDER — ZOLPIDEM TARTRATE 5 MG PO TABS: 5.0000 mg | ORAL_TABLET | Freq: Every evening | ORAL | Status: DC | PRN

## 2012-06-12 MED ORDER — TETANUS-DIPHTH-ACELL PERTUSSIS 5-2.5-18.5 LF-MCG/0.5 IM SUSP
0.5000 mL | Freq: Once | INTRAMUSCULAR | Status: AC
  Administered 2012-06-13: 0.5 mL via INTRAMUSCULAR

## 2012-06-12 MED ORDER — NALOXONE HCL 0.4 MG/ML IJ SOLN: 0.4000 mg | INTRAMUSCULAR | Status: DC | PRN

## 2012-06-12 MED ORDER — BUPIVACAINE-EPINEPHRINE (PF) 0.5% -1:200000 IJ SOLN
INTRAMUSCULAR | Status: AC
  Filled 2012-06-12: qty 10

## 2012-06-12 MED ORDER — LACTATED RINGERS IV SOLN
INTRAVENOUS | Status: DC
  Administered 2012-06-13: 03:00:00 via INTRAVENOUS

## 2012-06-12 MED ORDER — EPHEDRINE SULFATE 50 MG/ML IJ SOLN
INTRAMUSCULAR | Status: DC | PRN
  Administered 2012-06-12 (×3): 10 mg via INTRAVENOUS

## 2012-06-12 MED ORDER — MENTHOL 3 MG MT LOZG: 1.0000 | LOZENGE | OROMUCOSAL | Status: DC | PRN

## 2012-06-12 MED ORDER — ONDANSETRON HCL 4 MG/2ML IJ SOLN: 4.0000 mg | Freq: Four times a day (QID) | INTRAMUSCULAR | Status: DC | PRN

## 2012-06-12 MED ORDER — FENTANYL CITRATE 0.05 MG/ML IJ SOLN
50.0000 ug | Freq: Once | INTRAMUSCULAR | Status: AC
Start: 2012-06-12 — End: 2012-06-12
  Administered 2012-06-12 (×2): 50 ug via INTRAVENOUS

## 2012-06-12 MED ORDER — SODIUM CHLORIDE 0.9 % IJ SOLN: 3.0000 mL | INTRAMUSCULAR | Status: DC | PRN

## 2012-06-12 MED ORDER — CEFAZOLIN SODIUM-DEXTROSE 2-3 GM-% IV SOLR: 2.0000 g | INTRAVENOUS | Status: DC

## 2012-06-12 MED ORDER — EPHEDRINE 5 MG/ML INJ
INTRAVENOUS | Status: AC
  Filled 2012-06-12: qty 10

## 2012-06-12 MED ORDER — IBUPROFEN 600 MG PO TABS: 600.0000 mg | ORAL_TABLET | Freq: Four times a day (QID) | ORAL | Status: DC | PRN

## 2012-06-12 MED ORDER — FENTANYL CITRATE 0.05 MG/ML IJ SOLN
INTRAMUSCULAR | Status: DC | PRN
  Administered 2012-06-12: 50 ug via INTRAVENOUS
  Administered 2012-06-12: 25 ug via INTRAVENOUS
  Administered 2012-06-12: 25 ug via INTRATHECAL

## 2012-06-12 MED ORDER — PRENATAL MULTIVITAMIN CH
1.0000 | ORAL_TABLET | Freq: Every day | ORAL | Status: DC
Start: 2012-06-12 — End: 2012-06-14
  Administered 2012-06-13 – 2012-06-14 (×2): 1 via ORAL
  Filled 2012-06-12 (×2): qty 1

## 2012-06-12 MED ORDER — GLYCOPYRROLATE 0.2 MG/ML IJ SOLN
INTRAMUSCULAR | Status: DC | PRN
  Administered 2012-06-12: 0.2 mg via INTRAVENOUS

## 2012-06-12 MED ORDER — OXYTOCIN 10 UNIT/ML IJ SOLN
INTRAMUSCULAR | Status: AC
  Filled 2012-06-12: qty 4

## 2012-06-12 MED ORDER — KETOROLAC TROMETHAMINE 60 MG/2ML IM SOLN
INTRAMUSCULAR | Status: AC
  Filled 2012-06-12: qty 2

## 2012-06-12 MED ORDER — METOCLOPRAMIDE HCL 5 MG/ML IJ SOLN: 10.0000 mg | Freq: Three times a day (TID) | INTRAMUSCULAR | Status: DC | PRN

## 2012-06-12 MED ORDER — FENTANYL CITRATE 0.05 MG/ML IJ SOLN
INTRAMUSCULAR | Status: AC
  Filled 2012-06-12: qty 2

## 2012-06-12 MED ORDER — OXYTOCIN 40 UNITS IN LACTATED RINGERS INFUSION - SIMPLE MED: 62.5000 mL/h | INTRAVENOUS | Status: AC

## 2012-06-12 MED ORDER — SENNOSIDES-DOCUSATE SODIUM 8.6-50 MG PO TABS
2.0000 | ORAL_TABLET | Freq: Every day | ORAL | Status: DC
  Administered 2012-06-12 – 2012-06-13 (×2): 2 via ORAL

## 2012-06-12 MED ORDER — METOCLOPRAMIDE HCL 5 MG/ML IJ SOLN: 10.0000 mg | Freq: Once | INTRAMUSCULAR | Status: DC | PRN

## 2012-06-12 MED ORDER — KETOROLAC TROMETHAMINE 30 MG/ML IJ SOLN
INTRAMUSCULAR | Status: DC | PRN
  Administered 2012-06-12: 30 mg via INTRAMUSCULAR
  Administered 2012-06-12: 30 mg via INTRAVENOUS

## 2012-06-12 MED ORDER — DIPHENHYDRAMINE HCL 12.5 MG/5ML PO ELIX
12.5000 mg | ORAL_SOLUTION | Freq: Four times a day (QID) | ORAL | Status: DC | PRN
  Filled 2012-06-12: qty 5

## 2012-06-12 MED ORDER — BUPIVACAINE-EPINEPHRINE 0.5% -1:200000 IJ SOLN
INTRAMUSCULAR | Status: DC | PRN
  Administered 2012-06-12: 10 mL

## 2012-06-12 MED ORDER — SCOPOLAMINE 1 MG/3DAYS TD PT72
MEDICATED_PATCH | TRANSDERMAL | Status: AC
  Administered 2012-06-12: 1.5 mg via TRANSDERMAL
  Filled 2012-06-12: qty 1

## 2012-06-12 MED ORDER — SODIUM CHLORIDE 0.9 % IJ SOLN: 9.0000 mL | INTRAMUSCULAR | Status: DC | PRN

## 2012-06-12 MED ORDER — KETOROLAC TROMETHAMINE 30 MG/ML IJ SOLN
30.0000 mg | Freq: Four times a day (QID) | INTRAMUSCULAR | Status: AC
  Administered 2012-06-13: 30 mg via INTRAVENOUS
  Filled 2012-06-12: qty 1

## 2012-06-12 MED ORDER — MEPERIDINE HCL 25 MG/ML IJ SOLN: 6.2500 mg | INTRAMUSCULAR | Status: DC | PRN

## 2012-06-12 MED ORDER — KETOROLAC TROMETHAMINE 30 MG/ML IJ SOLN: 30.0000 mg | Freq: Four times a day (QID) | INTRAMUSCULAR | Status: AC | PRN

## 2012-06-12 MED ORDER — LACTATED RINGERS IV SOLN
Freq: Once | INTRAVENOUS | Status: AC
  Administered 2012-06-12: 10:00:00 via INTRAVENOUS

## 2012-06-12 MED ORDER — LACTATED RINGERS IV SOLN
INTRAVENOUS | Status: DC | PRN
  Administered 2012-06-12: 11:00:00 via INTRAVENOUS

## 2012-06-12 MED ORDER — SCOPOLAMINE 1 MG/3DAYS TD PT72
1.0000 | MEDICATED_PATCH | Freq: Once | TRANSDERMAL | Status: DC
  Filled 2012-06-12: qty 1

## 2012-06-12 MED ORDER — 0.9 % SODIUM CHLORIDE (POUR BTL) OPTIME
TOPICAL | Status: DC | PRN
  Administered 2012-06-12: 1000 mL

## 2012-06-12 MED ORDER — HYDROMORPHONE HCL PF 1 MG/ML IJ SOLN: 0.2500 mg | INTRAMUSCULAR | Status: DC | PRN

## 2012-06-12 MED ORDER — MEASLES, MUMPS & RUBELLA VAC ~~LOC~~ INJ: 0.5000 mL | INJECTION | Freq: Once | SUBCUTANEOUS | Status: DC

## 2012-06-12 MED ORDER — FENTANYL 10 MCG/ML IV SOLN
INTRAVENOUS | Status: DC
  Administered 2012-06-12: 15:00:00 via INTRAVENOUS
  Administered 2012-06-12: 75 ug via INTRAVENOUS
  Administered 2012-06-12: 90 ug via INTRAVENOUS
  Administered 2012-06-13 (×2): 135 ug via INTRAVENOUS
  Administered 2012-06-13: 07:00:00 via INTRAVENOUS
  Filled 2012-06-12 (×2): qty 50

## 2012-06-12 SURGICAL SUPPLY — 48 items
CLOTH BEACON ORANGE TIMEOUT ST (SAFETY) ×2 IMPLANT
CONTAINER PREFILL 10% NBF 15ML (MISCELLANEOUS) IMPLANT
DRAIN JACKSON PRT FLT 7MM (DRAIN) IMPLANT
DRAPE SURG 17X23 STRL (DRAPES) ×2 IMPLANT
DRESSING TELFA 8X3 (GAUZE/BANDAGES/DRESSINGS) ×2 IMPLANT
DRSG COVADERM 4X10 (GAUZE/BANDAGES/DRESSINGS) IMPLANT
DURAPREP 26ML APPLICATOR (WOUND CARE) ×2 IMPLANT
ELECT REM PT RETURN 9FT ADLT (ELECTROSURGICAL) ×2
ELECTRODE REM PT RTRN 9FT ADLT (ELECTROSURGICAL) ×1 IMPLANT
EVACUATOR SILICONE 100CC (DRAIN) IMPLANT
EXTRACTOR VACUUM M CUP 4 TUBE (SUCTIONS) IMPLANT
GAUZE SPONGE 4X4 12PLY STRL LF (GAUZE/BANDAGES/DRESSINGS) ×4 IMPLANT
GLOVE BIOGEL PI IND STRL 7.5 (GLOVE) IMPLANT
GLOVE BIOGEL PI IND STRL 8.5 (GLOVE) ×1 IMPLANT
GLOVE BIOGEL PI INDICATOR 7.5 (GLOVE) ×1
GLOVE BIOGEL PI INDICATOR 8.5 (GLOVE) ×1
GLOVE ECLIPSE 7.0 STRL STRAW (GLOVE) ×1 IMPLANT
GLOVE ECLIPSE 8.0 STRL XLNG CF (GLOVE) ×4 IMPLANT
GLOVE SURG SS PI 6.5 STRL IVOR (GLOVE) ×3 IMPLANT
GOWN PREVENTION PLUS LG XLONG (DISPOSABLE) ×4 IMPLANT
GOWN PREVENTION PLUS XXLARGE (GOWN DISPOSABLE) ×2 IMPLANT
KIT ABG SYR 3ML LUER SLIP (SYRINGE) IMPLANT
NDL HYPO 25X1 1.5 SAFETY (NEEDLE) ×1 IMPLANT
NDL HYPO 25X5/8 SAFETYGLIDE (NEEDLE) IMPLANT
NEEDLE HYPO 25X1 1.5 SAFETY (NEEDLE) ×2 IMPLANT
NEEDLE HYPO 25X5/8 SAFETYGLIDE (NEEDLE) IMPLANT
PACK C SECTION WH (CUSTOM PROCEDURE TRAY) ×2 IMPLANT
PAD ABD 7.5X8 STRL (GAUZE/BANDAGES/DRESSINGS) ×2 IMPLANT
PAD OB MATERNITY 4.3X12.25 (PERSONAL CARE ITEMS) IMPLANT
RINGERS IRRIG 1000ML POUR BTL (IV SOLUTION) ×2 IMPLANT
SLEEVE SCD COMPRESS KNEE MED (MISCELLANEOUS) IMPLANT
SPONGE GAUZE 4X4 12PLY (GAUZE/BANDAGES/DRESSINGS) ×1 IMPLANT
STAPLER VISISTAT 35W (STAPLE) IMPLANT
SUT MNCRL AB 3-0 PS2 27 (SUTURE) IMPLANT
SUT PLAIN 0 NONE (SUTURE) IMPLANT
SUT SILK 3 0 FS 1X18 (SUTURE) IMPLANT
SUT VIC AB 0 CT1 27 (SUTURE) ×4
SUT VIC AB 0 CT1 27XBRD ANBCTR (SUTURE) ×2 IMPLANT
SUT VIC AB 2-0 CTX 36 (SUTURE) ×4 IMPLANT
SUT VIC AB 3-0 CT1 27 (SUTURE)
SUT VIC AB 3-0 CT1 TAPERPNT 27 (SUTURE) IMPLANT
SUT VIC AB 3-0 SH 27 (SUTURE)
SUT VIC AB 3-0 SH 27X BRD (SUTURE) IMPLANT
SYR CONTROL 10ML LL (SYRINGE) ×2 IMPLANT
TAPE CLOTH SURG 4X10 WHT LF (GAUZE/BANDAGES/DRESSINGS) ×1 IMPLANT
TOWEL OR 17X24 6PK STRL BLUE (TOWEL DISPOSABLE) ×4 IMPLANT
TRAY FOLEY CATH 14FR (SET/KITS/TRAYS/PACK) ×2 IMPLANT
WATER STERILE IRR 1000ML POUR (IV SOLUTION) ×2 IMPLANT

## 2012-06-12 NOTE — Anesthesia Preprocedure Evaluation (Signed)
Anesthesia Evaluation  Patient identified by MRN, date of birth, ID band Patient awake    Reviewed: Allergy & Precautions, H&P , Patient's Chart, lab work & pertinent test results  Airway Mallampati: II TM Distance: >3 FB Neck ROM: Full    Dental No notable dental hx. (+) Teeth Intact   Pulmonary Current Smoker,  breath sounds clear to auscultation  Pulmonary exam normal       Cardiovascular negative cardio ROS  Rhythm:Regular Rate:Normal     Neuro/Psych negative neurological ROS  negative psych ROS   GI/Hepatic Medicated and Controlled,(+)     substance abuse  marijuana use,   Endo/Other  negative endocrine ROS  Renal/GU negative Renal ROS  negative genitourinary   Musculoskeletal   Abdominal Normal abdominal exam  (+)   Peds  Hematology negative hematology ROS (+)   Anesthesia Other Findings   Reproductive/Obstetrics (+) Pregnancy                           Anesthesia Physical Anesthesia Plan  ASA: II  Anesthesia Plan: Spinal   Post-op Pain Management:    Induction:   Airway Management Planned:   Additional Equipment:   Intra-op Plan:   Post-operative Plan:   Informed Consent: I have reviewed the patients History and Physical, chart, labs and discussed the procedure including the risks, benefits and alternatives for the proposed anesthesia with the patient or authorized representative who has indicated his/her understanding and acceptance.     Plan Discussed with: Anesthesiologist  Anesthesia Plan Comments:         Anesthesia Quick Evaluation

## 2012-06-12 NOTE — Op Note (Signed)
OPERATIVE NOTE  Patient's Name: Sophia Garcia  Date of Birth: 1986/01/09   Medical Records Number: [id number]   Date of Operation: 06/12/2012   Preoperative diagnosis:  [redacted]w[redacted]d weeks gestation  Prior Cesarean section  Desires Repeat Cesarean Section  Desires sterilization  Postoperative diagnosis:  Same  Procedure:  Repeat low transverse cesarean section Bilateral tubal sterilization procedure  Surgeon:  Leonard Schwartz, M.D.  Assistant:  Denny Levy, certified nurse midwife  Anesthesia:  Regional  Disposition:  Sophia Garcia is a 26 y.o. female, [redacted]w[redacted]d, who presents at [redacted]w[redacted]d weeks gestation. The patient has been followed at the [facility] obstetrics and gynecology division of [facility] health care for women. This pregnancy has been complicated by a prior cesarean section. The patient desires a repeat cesarean section. She understands the indications for her procedure and she accepts the risk of, but not limited to, anesthetic complications, bleeding, infections, and possible damage to the surrounding organs.  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  The Apgar scores were 9/9. The uterus, fallopian tubes, and ovaries were normal for the gravid state.  Procedure:  The patient was taken to the operating room where a spinal anesthetic was given.The perineum was prepped with betadine. A Foley catheter was placed in the bladder.The patient's abdomen was prepped with Duraprep.   The patient was sterilely draped. The lower abdomen was injected with half percent Marcaine with epinephrine. A low transverse incision was made in the abdomen and carried sharply through the subcutaneous tissue, the fascia, and the anterior peritoneum. An incision was made in the lower uterine segment. The incision was extended in a low transverse fashion. The membranes were ruptured. The fetal head was delivered without difficulty. The  mouth and nose were suctioned. The remainder of the infant was then delivered. The cord was clamped and cut. The infant was handed to the awaiting pediatric team. The placenta was removed. The uterine cavity was cleaned of amniotic fluid, clotted blood, and membranes. The uterine incision was closed using a running locking suture of 2-0 Vicryl. An imbricating suture of 2-0 Vicryl was placed. The right fallopian tube was identified and followed to its fimbriated end. A knuckle of tube was made on the right using 2 free ties of 0 plain catgut suture. The knuckle of tube thus made was excised. Hemostasis was adequate. An identical procedure was carried out on the opposite side. The pelvis was vigorously irrigated. Hemostasis was adequate. The anterior peritoneum and the abdominal musculature were closed using 2-0 Vicryl. The fascia was closed using a running suture of 0 Vicryl followed by 3 interrupted sutures of 0 Vicryl. The subcutaneous layer was closed using interrupted sutures of 2-0 Vicryl. The skin was reapproximated using a subcuticular suture of 3-0 Monocryl. Sponge, needle, and instrument counts were correct on 2 occasions. The estimated blood loss for the procedure was 600 cc. The patient tolerated her procedure well. She was transported to the recovery room in stable condition. The infant remained in the operating room with the mother for bonding. The placenta was sent to labor and delivery.  Leonard Schwartz, M.D.

## 2012-06-12 NOTE — Anesthesia Procedure Notes (Signed)
Spinal  Patient location during procedure: OR Start time: 06/12/2012 10:56 AM Staffing Anesthesiologist: Tametha Banning A. Performed by: anesthesiologist  Preanesthetic Checklist Completed: patient identified, site marked, surgical consent, pre-op evaluation, timeout performed, IV checked, risks and benefits discussed and monitors and equipment checked Spinal Block Patient position: sitting Prep: site prepped and draped and DuraPrep Patient monitoring: heart rate, cardiac monitor, continuous pulse ox and blood pressure Approach: midline Location: L3-4 Injection technique: single-shot Needle Needle type: Sprotte  Needle gauge: 24 G Needle length: 9 cm Needle insertion depth: 4 cm Assessment Sensory level: T4 Additional Notes Patient tolerated procedure well. Adequate sensory level.

## 2012-06-12 NOTE — Transfer of Care (Signed)
Immediate Anesthesia Transfer of Care Note  Patient: Sophia Garcia  Procedure(s) Performed: Procedure(s) (LRB) with comments: CESAREAN SECTION (N/A) - Repeat  Patient Location: PACU  Anesthesia Type: Spinal  Level of Consciousness: awake, alert  and oriented  Airway & Oxygen Therapy: Patient Spontanous Breathing  Post-op Assessment: Report given to PACU RN and Post -op Vital signs reviewed and stable  Post vital signs: Reviewed and stable  Complications: No apparent anesthesia complications

## 2012-06-12 NOTE — Anesthesia Postprocedure Evaluation (Signed)
  Anesthesia Post-op Note  Patient: Sophia Garcia  Procedure(s) Performed: Procedure(s) (LRB) with comments: CESAREAN SECTION (N/A) - Repeat  Patient is awake, responsive, moving her legs, and has signs of resolution of her numbness. Pain and nausea are reasonably well controlled. Vital signs are stable and clinically acceptable. Oxygen saturation is clinically acceptable. There are no apparent anesthetic complications at this time. Patient is ready for discharge.

## 2012-06-12 NOTE — Consult Note (Signed)
Neonatology Note:   Attendance at C-section:    I was asked to attend this repeat C/S at term. The mother is a G2P1 O pos, GBS pos with a history of smoking and marijuana use. ROM at delivery, fluid clear. Infant vigorous with good spontaneous cry and tone. Needed only minimal bulb suctioning. Ap 9/9. Lungs clear to ausc in DR, but had some minimal grunting noted. Nursery nurse instructed to allow him some skin to skin time, but to take him to CN if he has any worsening of the grunting. To CN to care of Pediatrician.   Deatra James, MD

## 2012-06-12 NOTE — H&P (Signed)
The patient was interviewed and examined today.  The previously documented history and physical examination was reviewed. There are no changes. The operative procedure was reviewed. The risks and benefits were outlined again. The specific risks include, but are not limited to, anesthetic complications, bleeding, infections, and possible damage to the surrounding organs. The patient's questions were answered.  We are ready to proceed as outlined. The likelihood of the patient achieving the goals of this procedure is very likely. The patient would also like a tubal ligation. She understands the small but real risk of failure (17 per 1000).  BP 128/72  Pulse 95  Temp 97.7 F (36.5 C) (Oral)  Resp 18  Ht 4\' 11"  (1.499 m)  Wt 132 lb (59.875 kg)  BMI 26.66 kg/m2  SpO2 100%  LMP 09/13/2011   CBC    Component Value Date/Time   WBC 9.9 06/04/2012 1511   RBC 3.75* 06/04/2012 1511   HGB 12.9 06/04/2012 1511   HCT 37.4 06/04/2012 1511   PLT 278 06/04/2012 1511   MCV 99.7 06/04/2012 1511   MCH 34.4* 06/04/2012 1511   MCHC 34.5 06/04/2012 1511   RDW 14.4 06/04/2012 1511   LYMPHSABS 1.8 12/03/2011 1625   MONOABS 0.5 12/03/2011 1625   EOSABS 0.1 12/03/2011 1625   BASOSABS 0.0 12/03/2011 1625      Leonard Schwartz, M.D.

## 2012-06-13 ENCOUNTER — Encounter (HOSPITAL_COMMUNITY): Payer: Self-pay

## 2012-06-13 DIAGNOSIS — Z98891 History of uterine scar from previous surgery: Secondary | ICD-10-CM | POA: Diagnosis not present

## 2012-06-13 LAB — CBC
HCT: 26.6 % — ABNORMAL LOW (ref 36.0–46.0)
MCH: 34.5 pg — ABNORMAL HIGH (ref 26.0–34.0)
MCHC: 34.6 g/dL (ref 30.0–36.0)
MCV: 99.6 fL (ref 78.0–100.0)
Platelets: 191 10*3/uL (ref 150–400)
RDW: 14.2 % (ref 11.5–15.5)

## 2012-06-13 NOTE — Anesthesia Postprocedure Evaluation (Signed)
  Anesthesia Post-op Note  Patient: Sophia Garcia  Procedure(s) Performed: Procedure(s) (LRB) with comments: CESAREAN SECTION (N/A) - Repeat  Patient Location: PACU and Mother/Baby  Anesthesia Type: Spinal  Level of Consciousness: awake, alert  and oriented  Airway and Oxygen Therapy: Patient Spontanous Breathing  Post-op Pain: mild  Post-op Assessment: Post-op Vital signs reviewed  Post-op Vital Signs: Reviewed and stable  Complications: No apparent anesthesia complications

## 2012-06-13 NOTE — Progress Notes (Signed)
Subjective: Postpartum Day 1: Cesarean Delivery due to repeat, with BTL Patient up ad lib, reports no syncope or dizziness. Feeding:  Breast Contraceptive plan:  BTL performed with C/S  Objective: Vital signs in last 24 hours: Temp:  [97.3 F (36.3 C)-99 F (37.2 C)] 97.9 F (36.6 C) (10/26 0604) Pulse Rate:  [66-95] 68  (10/26 0604) Resp:  [16-21] 17  (10/26 0646) BP: (99-172)/(58-102) 117/70 mmHg (10/26 0604) SpO2:  [97 %-100 %] 98 % (10/26 0646)  Physical Exam:  General: alert Lochia: appropriate Uterine Fundus: firm Incision: Dressing CDI JP drain:   NA   Basename 06/13/12 0500  HGB 9.2*  HCT 26.6*    Assessment/Plan: Status post Cesarean section day 1. Doing well postoperatively.  Continue current care. D/C PCA Check orthostatics.    Nigel Bridgeman 06/13/2012, 8:38 AM

## 2012-06-13 NOTE — Progress Notes (Signed)
1 cc wasted from fent. Syringe. With [employer]

## 2012-06-13 NOTE — Addendum Note (Signed)
Addendum  created 06/13/12 0745 by Armanda Heritage, RN   Modules edited:Notes Section

## 2012-06-14 DIAGNOSIS — D649 Anemia, unspecified: Secondary | ICD-10-CM

## 2012-06-14 MED ORDER — IBUPROFEN 600 MG PO TABS: 600.0000 mg | ORAL_TABLET | Freq: Four times a day (QID) | ORAL | Status: DC | PRN

## 2012-06-14 MED ORDER — OXYCODONE-ACETAMINOPHEN 5-325 MG PO TABS: 1.0000 | ORAL_TABLET | ORAL | Status: DC | PRN

## 2012-06-14 MED ORDER — FERROUS SULFATE 325 (65 FE) MG PO TABS: 325.0000 mg | ORAL_TABLET | Freq: Every day | ORAL | Status: DC

## 2012-06-14 MED ORDER — SENNOSIDES-DOCUSATE SODIUM 8.6-50 MG PO TABS: 2.0000 | ORAL_TABLET | Freq: Every day | ORAL | Status: DC

## 2012-06-14 NOTE — Discharge Summary (Signed)
Physician Discharge Summary  Patient ID: Sophia Garcia MRN: [id number] DOB/AGE: 14-Dec-1985 26 y.o.  Admit date: 06/12/2012 Discharge date: 06/14/2012  Admission Diagnoses: 39W IUP for repeat c/s  Discharge Diagnoses:  Principal Problem:  *Status post repeat low transverse cesarean section/BTL Active Problems:  Anemia   Discharged Condition: stable  Hospital Course: [redacted]w[redacted]d repeat cesarean section, BTL, normal involution, anemia   Consults: None  Significant Diagnostic Studies: labs:  Hemoglobin & Hematocrit     Component Value Date/Time   HGB 9.2* 06/13/2012 0500   HCT 26.6* 06/13/2012 0500      Treatments: IV hydration  Discharge Exam: Blood pressure 107/67, pulse 68, temperature 98 F (36.7 C), temperature source Oral, resp. rate 18, height 4\' 11"  (1.499 m), weight 132 lb (59.875 kg), last menstrual period 09/13/2011, SpO2 98.00%, unknown if currently breastfeeding. General appearance: alert, cooperative and no distress Subjective: Postpartum Day 2 Cesarean Delivery 2 Patient reports tolerating PO, + flatus and no problems voiding pain mes working well.   S: comfortable, little bleeding, slept, breast feeding, no smoking since Fri am     Feeding Hemoglobin & Hematocrit     Component Value Date/Time   HGB 9.2* 06/13/2012 0500   HCT 26.6* 06/13/2012 0500    Temp:  [97.5 F (36.4 C)-98 F (36.7 C)] 98 F (36.7 C) (10/27 0555) Pulse Rate:  [64-80] 68  (10/27 0555) Resp:  [18] 18  (10/27 0555) BP: (98-123)/(46-75) 107/67 mmHg (10/27 0555) SpO2:  [97 %-98 %] 98 % (10/26 1000)  Physical Exam:  General: alert, cooperative and no distress Lochia: appropriate Uterine Fundus: firm Incision: well approximated no redness, edema, or drainage with ecchymosis DVT Evaluation: Negative Homan's sign. Calf/Ankle edema is present. +1 pitting. bilaterally Lungs clear bilaterally AP RRR Bowel sounds active abd Softly distended  Disposition: 01-Home or Self  Care  Discharge Orders    Future Appointments: Provider: Department: Dept Phone: Center:   07/23/2012 10:15 AM Kirkland Hun, MD [facility] [phone] None       Medication List     As of 06/14/2012  8:39 AM    STOP taking these medications         famotidine 20 MG tablet   Commonly known as: PEPCID      TAKE these medications         ferrous sulfate 325 (65 FE) MG tablet   Take 1 tablet (325 mg total) by mouth daily.      ibuprofen 600 MG tablet   Commonly known as: ADVIL,MOTRIN   Take 1 tablet (600 mg total) by mouth every 6 (six) hours as needed.      oxyCODONE-acetaminophen 5-325 MG per tablet   Commonly known as: PERCOCET/ROXICET   Take 1-2 tablets by mouth every 4 (four) hours as needed (moderate - severe pain).      prenatal multivitamin Tabs   Take 1 tablet by mouth daily.      senna-docusate 8.6-50 MG per tablet   Commonly known as: Senokot-S   Take 2 tablets by mouth at bedtime.           Follow-up Information    Follow up with [facility]. In 6 weeks.   Contact information:   [address] [place] [address] [phone]       [facility] handbook.  SignedLavera Guise 06/14/2012, 8:39 AM

## 2012-06-15 ENCOUNTER — Encounter (HOSPITAL_COMMUNITY): Payer: Self-pay | Admitting: Obstetrics and Gynecology

## 2012-06-15 NOTE — Progress Notes (Signed)
Post discharge chart review completed.  

## 2012-06-18 ENCOUNTER — Telehealth: Payer: Self-pay | Admitting: Obstetrics and Gynecology

## 2012-06-18 NOTE — Telephone Encounter (Signed)
Spoke with pt rgd msg pt c/o brusing under c section sit no oder no discharge no pain consult with vl cnm pt to apply heat if worse call office pt voice understanding

## 2012-06-18 NOTE — Telephone Encounter (Signed)
Lm on vm tcb rgd msg 

## 2012-07-22 ENCOUNTER — Ambulatory Visit (INDEPENDENT_AMBULATORY_CARE_PROVIDER_SITE_OTHER): Payer: Medicaid Other | Admitting: Obstetrics and Gynecology

## 2012-07-22 ENCOUNTER — Encounter: Payer: Self-pay | Admitting: Obstetrics and Gynecology

## 2012-07-22 NOTE — Progress Notes (Signed)
Date of delivery: 06/12/12 Female Name: Sophia Garcia Vaginal delivery:no Cesarean section:yes Tubal ligation:yes GDM:no Breast Feeding:no Bottle Feeding:yes Post-Partum Blues:no Abnormal pap:no Normal GU function: yes Normal GI function:yes Returning to work:no EPDS: 13.  Pt c/o lack of sleep and feeling down.  No HI or SI.  She has support from her husband and sister.  She is eating regularly.  occ has crying episodes BP 100/60  Ht 4\' 11"  (1.499 m)  Wt 115 lb (52.164 kg)  BMI 23.23 kg/m2  Breastfeeding? Yes. female who presents for a postpartum visit.  ABD: soft nontender GU: vulva normal no masses seen.  Vagina normal in appearance.  Cervix is parous and NT.  Uterus normal size.  No adnexal tenderness bilaterally or fullness EXT: no CCEB BTL May resume intercourse , exercise and normal activity RT 2 weeks   wellbutrin rx given to pt.  She was told to call with any HI or SI

## 2012-07-23 ENCOUNTER — Telehealth: Payer: Self-pay

## 2012-07-23 ENCOUNTER — Encounter: Payer: Medicaid Other | Admitting: Obstetrics and Gynecology

## 2012-07-23 NOTE — Telephone Encounter (Signed)
Spoke with pt rgd msg pt states suppose to get rx for Wellbutrin no rx at pharm advised pt will consult with ND and call her back pt voice understanding

## 2012-07-24 MED ORDER — BUPROPION HCL ER (XL) 150 MG PO TB24: 150.0000 mg | ORAL_TABLET | Freq: Every day | ORAL | Status: DC

## 2012-07-24 NOTE — Telephone Encounter (Signed)
Try calling pt to inform rx sent to pharm number not in service will try again

## 2012-07-24 NOTE — Telephone Encounter (Signed)
Pt may have Wellbutrin XL 150 mg PO QD disp #30

## 2012-07-27 ENCOUNTER — Telehealth: Payer: Self-pay | Admitting: Obstetrics and Gynecology

## 2012-07-27 NOTE — Telephone Encounter (Signed)
Got mess. On 07-27-2012 pt request refill of medication. Called pt to discuss phone # D/C

## 2012-07-30 ENCOUNTER — Emergency Department (HOSPITAL_COMMUNITY)
Admission: EM | Admit: 2012-07-30 | Discharge: 2012-07-30 | Disposition: A | Discharge status: Home / Self Care | Payer: Medicaid Other | Attending: Emergency Medicine | Admitting: Emergency Medicine

## 2012-07-30 ENCOUNTER — Encounter (HOSPITAL_COMMUNITY): Payer: Self-pay | Admitting: Emergency Medicine

## 2012-07-30 DIAGNOSIS — J039 Acute tonsillitis, unspecified: Secondary | ICD-10-CM | POA: Insufficient documentation

## 2012-07-30 DIAGNOSIS — Z8719 Personal history of other diseases of the digestive system: Secondary | ICD-10-CM | POA: Insufficient documentation

## 2012-07-30 DIAGNOSIS — O09219 Supervision of pregnancy with history of pre-term labor, unspecified trimester: Secondary | ICD-10-CM | POA: Insufficient documentation

## 2012-07-30 DIAGNOSIS — R509 Fever, unspecified: Secondary | ICD-10-CM | POA: Insufficient documentation

## 2012-07-30 DIAGNOSIS — H9209 Otalgia, unspecified ear: Secondary | ICD-10-CM | POA: Insufficient documentation

## 2012-07-30 DIAGNOSIS — B977 Papillomavirus as the cause of diseases classified elsewhere: Secondary | ICD-10-CM | POA: Insufficient documentation

## 2012-07-30 DIAGNOSIS — R52 Pain, unspecified: Secondary | ICD-10-CM | POA: Insufficient documentation

## 2012-07-30 DIAGNOSIS — F17200 Nicotine dependence, unspecified, uncomplicated: Secondary | ICD-10-CM | POA: Insufficient documentation

## 2012-07-30 MED ORDER — AMOXICILLIN 500 MG PO CAPS: 500.0000 mg | ORAL_CAPSULE | Freq: Three times a day (TID) | ORAL | Status: DC

## 2012-07-30 MED ORDER — IBUPROFEN 600 MG PO TABS: 600.0000 mg | ORAL_TABLET | Freq: Four times a day (QID) | ORAL | Status: AC | PRN

## 2012-07-30 MED ORDER — AMOXICILLIN 250 MG PO CAPS
500.0000 mg | ORAL_CAPSULE | Freq: Once | ORAL | Status: AC
  Administered 2012-07-30: 500 mg via ORAL
  Filled 2012-07-30: qty 2

## 2012-07-30 NOTE — ED Notes (Signed)
Pt c/o onset of fever, chills, body aches and earache yesterday evening. Sore throat started this am. Pt has had low grade fever. No fever at this time.

## 2012-08-02 NOTE — ED Provider Notes (Signed)
History     CSN: [id number]  Arrival date & time 07/30/12  1209   First MD Initiated Contact with Patient 07/30/12 1351      Chief Complaint  Patient presents with  . Sore Throat  . Otalgia  . Chills    (Consider location/radiation/quality/duration/timing/severity/associated sxs/prior treatment) HPI Comments: Sophia Garcia presents with a 1 day history of sore throat, low grade fever, body aches and left earache since yesterday evening.  She denies nasal congestion, cough, mouth pain or swelling and shortness of breath.  She has taken ibuprofen with transient relief.  She has been exposed to strep throat in a family member within the last week.  Patient is a 26 y.o. female presenting with ear pain. The history is provided by the patient.  Otalgia Associated symptoms include sore throat. Pertinent negatives include no headaches, no rhinorrhea, no abdominal pain, no neck pain, no cough and no rash.    Past Medical History  Diagnosis Date  . Yeast infection   . BV (bacterial vaginosis)   . Normal pregnancy, incidental 12/23/2011  . Preterm labor   . HPV (human papilloma virus) infection   . GERD (gastroesophageal reflux disease)     Past Surgical History  Procedure Date  . Cesarean section   . Tooth extraction 2009    ONE TOOTH  . Foot surgery 2002    STEPPED ON SEWING NEEDLE  . Cesarean section 06/12/2012    Procedure: CESAREAN SECTION;  Surgeon: Kirkland Hun, MD;  Location: [facility];  Service: Obstetrics;  Laterality: N/A;  Repeat    Family History  Problem Relation Age of Onset  . Diabetes Father     ORAL MEDS  . Cancer Paternal Aunt     BREAST;LIVER  . Diabetes Paternal Aunt   . Diabetes Paternal Uncle   . COPD Paternal Uncle   . Cancer Maternal Grandmother     BREAST  . COPD Mother     EMPHYSEMA  . Diabetes Paternal Grandfather   . Other Neg Hx     History  Substance Use Topics  . Smoking status: Current Every Day Smoker -- 0.2 packs/day for 12  years    Types: Cigarettes  . Smokeless tobacco: Never Used  . Alcohol Use: No    OB History    Grav Para Term Preterm Abortions TAB SAB Ect Mult Living   2 2 2       2       Review of Systems  Constitutional: Positive for fever and chills.  HENT: Positive for ear pain and sore throat. Negative for congestion, rhinorrhea and neck pain.   Eyes: Negative.   Respiratory: Negative for cough, chest tightness and shortness of breath.   Cardiovascular: Negative for chest pain.  Gastrointestinal: Negative for nausea and abdominal pain.  Genitourinary: Negative.   Musculoskeletal: Positive for myalgias. Negative for joint swelling and arthralgias.  Skin: Negative.  Negative for rash and wound.  Neurological: Negative for dizziness, weakness, light-headedness, numbness and headaches.  Hematological: Negative.   Psychiatric/Behavioral: Negative.     Allergies  Latex; Baby oil; and Morphine and related  Home Medications   Current Outpatient Rx  Name  Route  Sig  Dispense  Refill  . BUPROPION HCL ER (XL) 150 MG PO TB24   Oral   Take 1 tablet (150 mg total) by mouth daily.   30 tablet   3   . FERROUS SULFATE 325 (65 FE) MG PO TABS   Oral   Take  1 tablet (325 mg total) by mouth daily.   30 tablet   1   . AMOXICILLIN 500 MG PO CAPS   Oral   Take 1 capsule (500 mg total) by mouth 3 (three) times daily.   30 capsule   0   . IBUPROFEN 600 MG PO TABS   Oral   Take 1 tablet (600 mg total) by mouth every 6 (six) hours as needed for pain.   20 tablet   0     BP 119/64  Pulse 92  Temp 98.2 F (36.8 C) (Oral)  Resp 20  Ht 4\' 11"  (1.499 m)  Wt 115 lb (52.164 kg)  BMI 23.23 kg/m2  SpO2 99%  LMP 07/30/2012  Physical Exam  Constitutional: She is oriented to person, place, and time. She appears well-developed and well-nourished.  HENT:  Head: Normocephalic and atraumatic. No trismus in the jaw.  Right Ear: Tympanic membrane, external ear and ear canal normal.  Left Ear:  Tympanic membrane, external ear and ear canal normal.  Nose: No mucosal edema or rhinorrhea.  Mouth/Throat: Uvula is midline and mucous membranes are normal. Oropharyngeal exudate and posterior oropharyngeal erythema present. No tonsillar abscesses.  Eyes: Conjunctivae normal are normal.  Cardiovascular: Normal rate and normal heart sounds.   Pulmonary/Chest: Effort normal. No respiratory distress. She has no wheezes. She has no rales.  Abdominal: Soft. There is no tenderness.  Musculoskeletal: Normal range of motion.  Neurological: She is alert and oriented to person, place, and time.  Skin: Skin is warm and dry. No rash noted.  Psychiatric: She has a normal mood and affect.    ED Course  Procedures (including critical care time)  Labs Reviewed - No data to display No results found.   1. Tonsillitis with exudate       MDM  Pt with exam c/w strep pharyngitis and recent positive exposure.  Strep screen deferred,  Pt treated with amoxil,  prescriiption strength ibuprofen,  Encouraged rest, fluids,  Recheck for any worsened sx.        Burgess Amor, PA 08/02/12 1010

## 2012-08-03 NOTE — Telephone Encounter (Signed)
Erroneous error Newman Waren, CNM  

## 2012-08-06 ENCOUNTER — Ambulatory Visit (INDEPENDENT_AMBULATORY_CARE_PROVIDER_SITE_OTHER): Payer: Medicaid Other | Admitting: Obstetrics and Gynecology

## 2012-08-06 ENCOUNTER — Encounter: Payer: Self-pay | Admitting: Obstetrics and Gynecology

## 2012-08-06 VITALS — BP 100/62 | Wt 112.0 lb

## 2012-08-06 DIAGNOSIS — O99345 Other mental disorders complicating the puerperium: Secondary | ICD-10-CM

## 2012-08-06 DIAGNOSIS — F53 Postpartum depression: Secondary | ICD-10-CM

## 2012-08-06 DIAGNOSIS — B977 Papillomavirus as the cause of diseases classified elsewhere: Secondary | ICD-10-CM

## 2012-08-06 DIAGNOSIS — F329 Major depressive disorder, single episode, unspecified: Secondary | ICD-10-CM

## 2012-08-06 NOTE — Patient Instructions (Signed)
Colposcopy  Colposcopy is a procedure that uses a special lighted microscope (colposcope). It examines your cervix and vagina, or the area around the outside of the vagina, for signs of disease or abnormalities in the cells. You may be sent to a specialist (gynecologist) to do the colposcopy. A biopsy (tissue sample) may be collected during a colposcopy, if the caregiver finds any unusual cells. The biopsy is sent to the lab for further testing, and the results are reported back to your caregiver.  A WOMAN MAY NEED THIS PROCEDURE IF:  · She has had an abnormal pap smear (taking cells from the cervix for testing).  · She has a sore on her cervix, and a Pap test was normal.  · The Pap test suggests human papilloma virus (HPV). This virus can cause genital warts and is linked to the development of cervical cancer.  · She has genital warts on the cervix, or in or around the outside of the vagina.  · Her mother took the drug DES while pregnant.  · She has painful intercourse.  · She has vaginal bleeding, especially after sexual intercourse.  · There is a need to evaluate the results of previous treatment.  BEFORE THE PROCEDURE   · Colposcopy is done when you are not having a menstrual period.  · For 24 hours before the colposcopy, do not:  · Douche.  · Use tampons.  · Use medicines, creams, or suppositories in the vagina.  · Have sexual intercourse.  PROCEDURE   · A colposcopy is done while a woman is lying on her back with her feet in foot rests (stirrups).  · A speculum is placed inside the vagina to keep it open and to allow the caregiver to see the cervix. This is the same instrument used to do a pap smear.  · The colposcope is placed outside the vagina. It is used to magnify and examine the cervix, vagina, and the area around the outside of the vagina.  · A small amount of liquid solution is placed on the area that is to be viewed. This solution is placed on with a cotton applicator. This solution makes it easier to  see the abnormal cells.  · Your caregiver will suck out mucus and cells from the canal of the cervix.  · Small pieces of tissue for biopsy may be taken at the same time. You may feel mild pain or discomfort when this is done.  · Your caregiver will record the location of the abnormal areas and send the tissue samples to a lab for analysis.  · If your caregiver biopsies the vagina or outside of the vagina, a local anesthetic (novocaine) is usually given.  AFTER THE PROCEDURE   · You may have some cramping that often goes away in a few minutes. You may have some soreness for a couple of days.  · You may take over-the-counter pain medicine as advised by your caregiver. Do not take aspirin because it can cause bleeding.  · Lie down for a few minutes if you feel lightheaded.  · You may have some bleeding or dark discharge that should stop in a few days.  · You may need to wear a sanitary pad for a few days.  HOME CARE INSTRUCTIONS   · Avoid sex, douching, and using tampons for a week or as directed.  · Only take medicine as directed by your caregiver.  · Continue to take birth control pills, if you are on them.  ·   Not all test results are available during your visit. If your test results are not back during the visit, make an appointment with your caregiver to find out the results. Do not assume everything is normal if you have not heard from your caregiver or the medical facility. It is important for you to follow up on all of your test results.  · Follow your caregiver's advice regarding medicines, activity, follow-up visits, and follow-up Pap tests.  SEEK MEDICAL CARE IF:   · You develop a rash.  · You have problems with your medicine.  SEEK IMMEDIATE MEDICAL CARE IF:  · You are bleeding heavily or are passing blood clots.  · You develop a fever over 102° F (38.9° C), with or without chills.  · You have abnormal vaginal discharge.  · You are having cramps that do not go away after taking your pain medicine.  · You  feel lightheaded, dizzy, or faint.  · You develop stomach pain.  Document Released: 10/26/2002 Document Revised: 10/28/2011 Document Reviewed: 06/08/2009  [employer]® Patient Information ©2013 [employer].

## 2012-08-06 NOTE — Progress Notes (Signed)
Subjective:    Sophia Garcia is a 26 y.o. female, G2P2002, who presents for 2 weeks follow -up for ppd. Pt states she feel much better.  She has acne and wants an eval   The following portions of the patient's history were reviewed and updated as appropriate: allergies, current medications, past family history.  Review of Systems Pertinent items are noted in HPI. Breast:Negative for breast lump,nipple discharge or nipple retraction Gastrointestinal: Negative for abdominal pain, change in bowel habits or rectal bleeding Urinary:negative   Objective:    BP 100/62  Wt 112 lb (50.803 kg)  LMP 07/30/2012  Breastfeeding? No    Weight:  Wt Readings from Last 1 Encounters:  08/06/12 112 lb (50.803 kg)          BMI: There is no height on file to calculate BMI.  General Appearance: Alert, appropriate appearance for age. No acute distress GYN exam:Physical Examination: General appearance - alert, well appearing, and in no distress Chest - clear to auscultation, no wheezes, rales or rhonchi, symmetric air entry Heart - normal rate and regular rhythm Abdomen - soft, nontender, nondistended, no masses or organomegaly    Assessment:    PP depression  HR hpv   Plan:    Pt doing well on welbutrin.  No HI or Si Pos HRHPV normal pap colpo scheduled Pt with acne wants to see dematologist will schedule

## 2012-08-07 ENCOUNTER — Telehealth: Payer: Self-pay

## 2012-08-07 NOTE — Telephone Encounter (Signed)
Lm on vm for pt to call back regarding appointment

## 2012-08-10 NOTE — ED Provider Notes (Addendum)
Medical screening examination/treatment/procedure(s) were performed by non-physician practitioner and as supervising physician I was immediately available for consultation/collaboration.   Benny Lennert, MD 08/10/12 1037                                      Medical screening examination/treatment/procedure(s) were performed by non-physician practitioner and as supervising physician I was immediately available for consultation/collaboration.   Benny Lennert, MD 08/11/12 [phone]

## 2012-08-13 ENCOUNTER — Telehealth: Payer: Self-pay

## 2012-08-13 NOTE — Telephone Encounter (Signed)
Lm on vm for pt to call back.

## 2012-08-13 NOTE — Telephone Encounter (Signed)
Tc to pt, scheduled appt with ND for 09/07/12. Advised pt to call office to reschedule if her cycle is on and nothing in the vagina for 24 hours before the procedure. Pt voiced understanding.

## 2012-09-07 ENCOUNTER — Ambulatory Visit: Payer: Medicaid Other | Admitting: Obstetrics and Gynecology

## 2012-09-07 ENCOUNTER — Encounter: Payer: Self-pay | Admitting: Obstetrics and Gynecology

## 2012-09-07 VITALS — BP 110/64 | Ht 59.0 in | Wt 111.0 lb

## 2012-09-07 DIAGNOSIS — IMO0002 Reserved for concepts with insufficient information to code with codable children: Secondary | ICD-10-CM

## 2012-09-07 DIAGNOSIS — Z139 Encounter for screening, unspecified: Secondary | ICD-10-CM

## 2012-09-07 LAB — POCT URINE PREGNANCY: Preg Test, Ur: NEGATIVE

## 2012-09-07 NOTE — Patient Instructions (Signed)
Smoking Cessation Quitting smoking is important to your health and has many advantages. However, it is not always easy to quit since nicotine is a very addictive drug. Often times, people try 3 times or more before being able to quit. This document explains the best ways for you to prepare to quit smoking. Quitting takes hard work and a lot of effort, but you can do it. ADVANTAGES OF QUITTING SMOKING  You will live longer, feel better, and live better.  Your body will feel the impact of quitting smoking almost immediately.  Within 20 minutes, blood pressure decreases. Your pulse returns to its normal level.  After 8 hours, carbon monoxide levels in the blood return to normal. Your oxygen level increases.  After 24 hours, the chance of having a heart attack starts to decrease. Your breath, hair, and body stop smelling like smoke.  After 48 hours, damaged nerve endings begin to recover. Your sense of taste and smell improve.  After 72 hours, the body is virtually free of nicotine. Your bronchial tubes relax and breathing becomes easier.  After 2 to 12 weeks, lungs can hold more air. Exercise becomes easier and circulation improves.  The risk of having a heart attack, stroke, cancer, or lung disease is greatly reduced.  After 1 year, the risk of coronary heart disease is cut in half.  After 5 years, the risk of stroke falls to the same as a nonsmoker.  After 10 years, the risk of lung cancer is cut in half and the risk of other cancers decreases significantly.  After 15 years, the risk of coronary heart disease drops, usually to the level of a nonsmoker.  If you are pregnant, quitting smoking will improve your chances of having a healthy baby.  The people you live with, especially any children, will be healthier.  You will have extra money to spend on things other than cigarettes. QUESTIONS TO THINK ABOUT BEFORE ATTEMPTING TO QUIT You may want to talk about your answers with your  caregiver.  Why do you want to quit?  If you tried to quit in the past, what helped and what did not?  What will be the most difficult situations for you after you quit? How will you plan to handle them?  Who can help you through the tough times? Your family? Friends? A caregiver?  What pleasures do you get from smoking? What ways can you still get pleasure if you quit? Here are some questions to ask your caregiver:  How can you help me to be successful at quitting?  What medicine do you think would be best for me and how should I take it?  What should I do if I need more help?  What is smoking withdrawal like? How can I get information on withdrawal? GET READY  Set a quit date.  Change your environment by getting rid of all cigarettes, ashtrays, matches, and lighters in your home, car, or work. Do not let people smoke in your home.  Review your past attempts to quit. Think about what worked and what did not. GET SUPPORT AND ENCOURAGEMENT You have a better chance of being successful if you have help. You can get support in many ways.  Tell your family, friends, and co-workers that you are going to quit and need their support. Ask them not to smoke around you.  Get individual, group, or telephone counseling and support. Programs are available at local hospitals and health centers. Call your local health department for   information about programs in your area.  Spiritual beliefs and practices may help some smokers quit.  Download a "quit meter" on your computer to keep track of quit statistics, such as how long you have gone without smoking, cigarettes not smoked, and money saved.  Get a self-help book about quitting smoking and staying off of tobacco. LEARN NEW SKILLS AND BEHAVIORS  Distract yourself from urges to smoke. Talk to someone, go for a walk, or occupy your time with a task.  Change your normal routine. Take a different route to work. Drink tea instead of coffee.  Eat breakfast in a different place.  Reduce your stress. Take a hot bath, exercise, or read a book.  Plan something enjoyable to do every day. Reward yourself for not smoking.  Explore interactive web-based programs that specialize in helping you quit. GET MEDICINE AND USE IT CORRECTLY Medicines can help you stop smoking and decrease the urge to smoke. Combining medicine with the above behavioral methods and support can greatly increase your chances of successfully quitting smoking.  Nicotine replacement therapy helps deliver nicotine to your body without the negative effects and risks of smoking. Nicotine replacement therapy includes nicotine gum, lozenges, inhalers, nasal sprays, and skin patches. Some may be available over-the-counter and others require a prescription.  Antidepressant medicine helps people abstain from smoking, but how this works is unknown. This medicine is available by prescription.  Nicotinic receptor partial agonist medicine simulates the effect of nicotine in your brain. This medicine is available by prescription. Ask your caregiver for advice about which medicines to use and how to use them based on your health history. Your caregiver will tell you what side effects to look out for if you choose to be on a medicine or therapy. Carefully read the information on the package. Do not use any other product containing nicotine while using a nicotine replacement product.  RELAPSE OR DIFFICULT SITUATIONS Most relapses occur within the first 3 months after quitting. Do not be discouraged if you start smoking again. Remember, most people try several times before finally quitting. You may have symptoms of withdrawal because your body is used to nicotine. You may crave cigarettes, be irritable, feel very hungry, cough often, get headaches, or have difficulty concentrating. The withdrawal symptoms are only temporary. They are strongest when you first quit, but they will go away within  10 14 days. To reduce the chances of relapse, try to:  Avoid drinking alcohol. Drinking lowers your chances of successfully quitting.  Reduce the amount of caffeine you consume. Once you quit smoking, the amount of caffeine in your body increases and can give you symptoms, such as a rapid heartbeat, sweating, and anxiety.  Avoid smokers because they can make you want to smoke.  Do not let weight gain distract you. Many smokers will gain weight when they quit, usually less than 10 pounds. Eat a healthy diet and stay active. You can always lose the weight gained after you quit.  Find ways to improve your mood other than smoking. FOR MORE INFORMATION  www.smokefree.gov  Document Released: 07/30/2001 Document Revised: 02/04/2012 Document Reviewed: 11/14/2011 [employer] Patient Information 2013 [employer].  

## 2012-09-07 NOTE — Progress Notes (Signed)
Pap 5/13 WNL with HR HPV colpo adequate Normal ECC done RT for pap in May Pt interested in smoking cessation.  Materials given to pt with counseling.

## 2012-09-17 ENCOUNTER — Telehealth: Payer: Self-pay

## 2012-09-17 NOTE — Telephone Encounter (Signed)
Spoke with pt rgd labs informed results and repeat pap every 6 months for a yr pt voice understanding

## 2012-09-17 NOTE — Telephone Encounter (Signed)
Message copied by Rolla Plate on Thu Sep 17, 2012  8:59 AM ------      Message from: Jaymes Graff      Created: Wed Sep 16, 2012  9:04 PM       Please review colpo results with patient and tell her I recommend a pap every six months for the next year.

## 2012-10-22 ENCOUNTER — Emergency Department (HOSPITAL_COMMUNITY): Payer: Self-pay

## 2012-10-22 ENCOUNTER — Encounter (HOSPITAL_COMMUNITY): Payer: Self-pay | Admitting: *Deleted

## 2012-10-22 ENCOUNTER — Emergency Department (HOSPITAL_COMMUNITY)
Admission: EM | Admit: 2012-10-22 | Discharge: 2012-10-22 | Disposition: A | Discharge status: Home / Self Care | Payer: Self-pay | Attending: Emergency Medicine | Admitting: Emergency Medicine

## 2012-10-22 DIAGNOSIS — Z8619 Personal history of other infectious and parasitic diseases: Secondary | ICD-10-CM | POA: Insufficient documentation

## 2012-10-22 DIAGNOSIS — F17200 Nicotine dependence, unspecified, uncomplicated: Secondary | ICD-10-CM | POA: Insufficient documentation

## 2012-10-22 DIAGNOSIS — S90122A Contusion of left lesser toe(s) without damage to nail, initial encounter: Secondary | ICD-10-CM

## 2012-10-22 DIAGNOSIS — Z8742 Personal history of other diseases of the female genital tract: Secondary | ICD-10-CM | POA: Insufficient documentation

## 2012-10-22 DIAGNOSIS — Z8719 Personal history of other diseases of the digestive system: Secondary | ICD-10-CM | POA: Insufficient documentation

## 2012-10-22 DIAGNOSIS — Z9889 Other specified postprocedural states: Secondary | ICD-10-CM | POA: Insufficient documentation

## 2012-10-22 DIAGNOSIS — Z8751 Personal history of pre-term labor: Secondary | ICD-10-CM | POA: Insufficient documentation

## 2012-10-22 DIAGNOSIS — W2209XA Striking against other stationary object, initial encounter: Secondary | ICD-10-CM | POA: Insufficient documentation

## 2012-10-22 DIAGNOSIS — S90129A Contusion of unspecified lesser toe(s) without damage to nail, initial encounter: Secondary | ICD-10-CM | POA: Insufficient documentation

## 2012-10-22 DIAGNOSIS — Y9289 Other specified places as the place of occurrence of the external cause: Secondary | ICD-10-CM | POA: Insufficient documentation

## 2012-10-22 DIAGNOSIS — Y9389 Activity, other specified: Secondary | ICD-10-CM | POA: Insufficient documentation

## 2012-10-22 MED ORDER — IBUPROFEN 800 MG PO TABS: 800.0000 mg | ORAL_TABLET | Freq: Three times a day (TID) | ORAL | Status: DC

## 2012-10-22 MED ORDER — IBUPROFEN 800 MG PO TABS
800.0000 mg | ORAL_TABLET | Freq: Once | ORAL | Status: AC
  Administered 2012-10-22: 800 mg via ORAL
  Filled 2012-10-22: qty 1

## 2012-10-22 NOTE — ED Provider Notes (Signed)
Medical screening examination/treatment/procedure(s) were performed by non-physician practitioner and as supervising physician I was immediately available for consultation/collaboration.   Joseph L Zammit, MD 10/22/12 2315 

## 2012-10-22 NOTE — ED Notes (Signed)
Left little toe pain, states she hit toe against the window sill

## 2012-10-22 NOTE — ED Provider Notes (Signed)
History     CSN: [id number]  Arrival date & time 10/22/12  1929   First MD Initiated Contact with Patient 10/22/12 2102      Chief Complaint  Patient presents with  . Toe Pain    (Consider location/radiation/quality/duration/timing/severity/associated sxs/prior treatment) Patient is a 27 y.o. female presenting with toe pain. The history is provided by the patient.  Toe Pain This is a new problem. The current episode started yesterday. The problem occurs constantly. The problem has been gradually worsening. Pertinent negatives include no abdominal pain, arthralgias, chest pain, coughing, fever, neck pain or numbness. The symptoms are aggravated by standing and walking. She has tried nothing for the symptoms. The treatment provided no relief.    Past Medical History  Diagnosis Date  . Yeast infection   . BV (bacterial vaginosis)   . Normal pregnancy, incidental 12/23/2011  . Preterm labor   . HPV (human papilloma virus) infection   . GERD (gastroesophageal reflux disease)     Past Surgical History  Procedure Laterality Date  . Cesarean section    . Tooth extraction  2009    ONE TOOTH  . Foot surgery  2002    STEPPED ON SEWING NEEDLE  . Cesarean section  06/12/2012    Procedure: CESAREAN SECTION;  Surgeon: Kirkland Hun, MD;  Location: [facility];  Service: Obstetrics;  Laterality: N/A;  Repeat  . Tubal ligation      Family History  Problem Relation Age of Onset  . Diabetes Father     ORAL MEDS  . Cancer Paternal Aunt     BREAST;LIVER  . Diabetes Paternal Aunt   . Diabetes Paternal Uncle   . COPD Paternal Uncle   . Cancer Maternal Grandmother     BREAST  . COPD Mother     EMPHYSEMA  . Diabetes Paternal Grandfather   . Other Neg Hx     History  Substance Use Topics  . Smoking status: Current Every Day Smoker -- 0.25 packs/day for 12 years    Types: Cigarettes  . Smokeless tobacco: Never Used  . Alcohol Use: No    OB History   Grav Para Term Preterm Abortions  TAB SAB Ect Mult Living   2 2 2       2       Review of Systems  Constitutional: Negative for fever and activity change.       All ROS Neg except as noted in HPI  HENT: Negative for nosebleeds and neck pain.   Eyes: Negative for photophobia and discharge.  Respiratory: Negative for cough, shortness of breath and wheezing.   Cardiovascular: Negative for chest pain and palpitations.  Gastrointestinal: Negative for abdominal pain and blood in stool.  Genitourinary: Negative for dysuria, frequency and hematuria.  Musculoskeletal: Negative for back pain and arthralgias.  Skin: Negative.   Neurological: Negative for dizziness, seizures, speech difficulty and numbness.  Psychiatric/Behavioral: Negative for hallucinations and confusion.    Allergies  Latex; Baby oil; and Morphine and related  Home Medications  No current outpatient prescriptions on file.  BP 117/80  Pulse 71  Temp(Src) 97.3 F (36.3 C) (Oral)  Resp 18  Ht 4\' 11"  (1.499 m)  Wt 109 lb (49.442 kg)  BMI 22 kg/m2  SpO2 99%  LMP 10/22/2012  Physical Exam  Nursing note and vitals reviewed. Constitutional: She is oriented to person, place, and time. She appears well-developed and well-nourished.  Non-toxic appearance.  HENT:  Head: Normocephalic.  Right Ear: Tympanic membrane and  external ear normal.  Left Ear: Tympanic membrane and external ear normal.  Eyes: EOM and lids are normal. Pupils are equal, round, and reactive to light.  Neck: Normal range of motion. Neck supple. Carotid bruit is not present.  Cardiovascular: Normal rate, regular rhythm, normal heart sounds, intact distal pulses and normal pulses.   Pulmonary/Chest: Breath sounds normal. No respiratory distress.  Abdominal: Soft. Bowel sounds are normal. There is no tenderness. There is no guarding.  Musculoskeletal: Normal range of motion.  There is a bruise to the dorsal medial aspect of the left fifth toe. There is pain to palpation or movement of the  fifth toe. There is no deformity of the toe. There is no pain or deformity of any of the other toes on left foot. The dorsalis pedis pulse is 2+. The Achilles tendon is intact.  Lymphadenopathy:       Head (right side): No submandibular adenopathy present.       Head (left side): No submandibular adenopathy present.    She has no cervical adenopathy.  Neurological: She is alert and oriented to person, place, and time. She has normal strength. No cranial nerve deficit or sensory deficit.  Skin: Skin is warm and dry.  Psychiatric: She has a normal mood and affect. Her speech is normal.    ED Course  Procedures (including critical care time)  Labs Reviewed - No data to display Dg Toe 5th Left  10/22/2012  *RADIOLOGY REPORT*  Clinical Data: Injury, pain.  DG TOE 5TH LEFT  Comparison: Plain films left foot to 01/19/2010.  Findings: Imaged bones, joints and soft tissues appear normal.  IMPRESSION: Negative exam.   Original Report Authenticated By: Holley Dexter, M.D.    Pulse oximetry is 99% on room air. Within normal limits by my interpretation.  No diagnosis found.    MDM  I have reviewed nursing notes, vital signs, and all appropriate lab and imaging results for this patient. Patient states she hit her left fifth toe on a window seal on yesterday. Today she has swelling and bruising. The x-ray of the left fifth toe is negative for fracture or dislocation. The patient is fitted with a postoperative shoe for comfort. She will use ibuprofen 800 mg 3 times daily for pain and soreness.       Kathie Dike, PA-C 10/22/12 2113

## 2012-10-22 NOTE — ED Notes (Signed)
Pain lt 5th toe, struck against a window sill this am.  Contusion present.

## 2013-02-11 ENCOUNTER — Encounter (HOSPITAL_COMMUNITY): Payer: Self-pay | Admitting: *Deleted

## 2013-02-11 ENCOUNTER — Emergency Department (HOSPITAL_COMMUNITY)
Admission: EM | Admit: 2013-02-11 | Discharge: 2013-02-11 | Disposition: A | Discharge status: Home / Self Care | Payer: Self-pay | Attending: Emergency Medicine | Admitting: Emergency Medicine

## 2013-02-11 ENCOUNTER — Emergency Department (HOSPITAL_COMMUNITY): Payer: Self-pay

## 2013-02-11 DIAGNOSIS — K219 Gastro-esophageal reflux disease without esophagitis: Secondary | ICD-10-CM | POA: Insufficient documentation

## 2013-02-11 DIAGNOSIS — Z79899 Other long term (current) drug therapy: Secondary | ICD-10-CM | POA: Insufficient documentation

## 2013-02-11 DIAGNOSIS — Z91040 Latex allergy status: Secondary | ICD-10-CM | POA: Insufficient documentation

## 2013-02-11 DIAGNOSIS — R109 Unspecified abdominal pain: Secondary | ICD-10-CM

## 2013-02-11 DIAGNOSIS — Z3202 Encounter for pregnancy test, result negative: Secondary | ICD-10-CM | POA: Insufficient documentation

## 2013-02-11 DIAGNOSIS — N83209 Unspecified ovarian cyst, unspecified side: Secondary | ICD-10-CM

## 2013-02-11 DIAGNOSIS — Z8742 Personal history of other diseases of the female genital tract: Secondary | ICD-10-CM | POA: Insufficient documentation

## 2013-02-11 DIAGNOSIS — F17200 Nicotine dependence, unspecified, uncomplicated: Secondary | ICD-10-CM | POA: Insufficient documentation

## 2013-02-11 DIAGNOSIS — Z8619 Personal history of other infectious and parasitic diseases: Secondary | ICD-10-CM | POA: Insufficient documentation

## 2013-02-11 DIAGNOSIS — Z885 Allergy status to narcotic agent status: Secondary | ICD-10-CM | POA: Insufficient documentation

## 2013-02-11 DIAGNOSIS — N898 Other specified noninflammatory disorders of vagina: Secondary | ICD-10-CM | POA: Insufficient documentation

## 2013-02-11 LAB — URINALYSIS, ROUTINE W REFLEX MICROSCOPIC
Bilirubin Urine: NEGATIVE
Ketones, ur: NEGATIVE mg/dL
Nitrite: NEGATIVE
Protein, ur: NEGATIVE mg/dL
Urobilinogen, UA: 0.2 mg/dL (ref 0.0–1.0)

## 2013-02-11 LAB — CBC WITH DIFFERENTIAL/PLATELET
Eosinophils Absolute: 0.2 10*3/uL (ref 0.0–0.7)
HCT: 37.4 % (ref 36.0–46.0)
Hemoglobin: 12.8 g/dL (ref 12.0–15.0)
Lymphs Abs: 1.9 10*3/uL (ref 0.7–4.0)
MCH: 31.7 pg (ref 26.0–34.0)
Monocytes Absolute: 0.4 10*3/uL (ref 0.1–1.0)
Monocytes Relative: 5 % (ref 3–12)
Neutro Abs: 4.7 10*3/uL (ref 1.7–7.7)
Neutrophils Relative %: 66 % (ref 43–77)
RBC: 4.04 MIL/uL (ref 3.87–5.11)

## 2013-02-11 LAB — COMPREHENSIVE METABOLIC PANEL
Alkaline Phosphatase: 27 U/L — ABNORMAL LOW (ref 39–117)
BUN: 15 mg/dL (ref 6–23)
Chloride: 106 mEq/L (ref 96–112)
Creatinine, Ser: 0.62 mg/dL (ref 0.50–1.10)
GFR calc Af Amer: >90 mL/min (ref >90)
Glucose, Bld: 95 mg/dL (ref 70–99)
Potassium: 4.2 mEq/L (ref 3.5–5.1)
Total Bilirubin: 0.5 mg/dL (ref 0.3–1.2)

## 2013-02-11 LAB — WET PREP, GENITAL: Trich, Wet Prep: NONE SEEN

## 2013-02-11 LAB — LIPASE, BLOOD: Lipase: 31 U/L (ref 11–59)

## 2013-02-11 MED ORDER — SODIUM CHLORIDE 0.9 % IV BOLUS (SEPSIS)
500.0000 mL | Freq: Once | INTRAVENOUS | Status: AC
  Administered 2013-02-11: 500 mL via INTRAVENOUS

## 2013-02-11 MED ORDER — HYDROCODONE-ACETAMINOPHEN 5-325 MG PO TABS: 2.0000 | ORAL_TABLET | ORAL | Status: DC | PRN

## 2013-02-11 MED ORDER — ONDANSETRON 4 MG PO TBDP: ORAL_TABLET | ORAL | Status: DC

## 2013-02-11 MED ORDER — IOHEXOL 300 MG/ML  SOLN
80.0000 mL | Freq: Once | INTRAMUSCULAR | Status: AC | PRN
  Administered 2013-02-11: 80 mL via INTRAVENOUS

## 2013-02-11 MED ORDER — IOHEXOL 300 MG/ML  SOLN
25.0000 mL | INTRAMUSCULAR | Status: AC
  Administered 2013-02-11 (×2): 25 mL via ORAL

## 2013-02-11 MED ORDER — FENTANYL CITRATE 0.05 MG/ML IJ SOLN
50.0000 ug | Freq: Once | INTRAMUSCULAR | Status: AC
  Administered 2013-02-11: 50 ug via INTRAVENOUS
  Filled 2013-02-11: qty 2

## 2013-02-11 NOTE — ED Notes (Signed)
Up to BR to void ct in to instruct pt on drinking contrast

## 2013-02-11 NOTE — ED Notes (Signed)
Pt reports groin and lower abdominal pain x 1 week.  Reports increased pain with urination.  Pt also reports bloating x 1 week.  LMP-June 5

## 2013-02-11 NOTE — ED Notes (Signed)
Pelvic done w/ spculum and bimanual tolerated well spec to lab

## 2013-02-11 NOTE — ED Notes (Signed)
Swollen  abd   X 1 1/2 weeks states got her tubes tied 8 months ago yesterday and doesn't think she could be preg states has 72 month old states that they did  c sect at the same time of tubes ties  Hurts to pee she states had a hard bm earler today

## 2013-02-11 NOTE — ED Provider Notes (Signed)
History    CSN: [id number] Arrival date & time 02/11/13  1224  First MD Initiated Contact with Patient 02/11/13 1315     Chief Complaint  Patient presents with  . Abdominal Pain   (Consider location/radiation/quality/duration/timing/severity/associated sxs/prior Treatment) Patient is a 27 y.o. female presenting with abdominal pain. The history is provided by the patient.  Abdominal Pain This is a new problem. The current episode started more than 1 week ago. Associated symptoms include abdominal pain. Pertinent negatives include no chest pain, no headaches and no shortness of breath.   patient presents with lower abdominal pain for the last week or 2. She states her abdomen is gotten bigger. She states the pain is constant. It is dull. No dysuria. No vaginal bleeding. No vaginal discharge. No nausea vomiting or constipation. no weight loss.   Past Medical History  Diagnosis Date  . Yeast infection   . BV (bacterial vaginosis)   . Normal pregnancy, incidental 12/23/2011  . Preterm labor   . HPV (human papilloma virus) infection   . GERD (gastroesophageal reflux disease)    Past Surgical History  Procedure Laterality Date  . Cesarean section    . Tooth extraction  2009    ONE TOOTH  . Foot surgery  2002    STEPPED ON SEWING NEEDLE  . Cesarean section  06/12/2012    Procedure: CESAREAN SECTION;  Surgeon: Kirkland Hun, MD;  Location: [facility];  Service: Obstetrics;  Laterality: N/A;  Repeat  . Tubal ligation     Family History  Problem Relation Age of Onset  . Diabetes Father     ORAL MEDS  . Cancer Paternal Aunt     BREAST;LIVER  . Diabetes Paternal Aunt   . Diabetes Paternal Uncle   . COPD Paternal Uncle   . Cancer Maternal Grandmother     BREAST  . COPD Mother     EMPHYSEMA  . Diabetes Paternal Grandfather   . Other Neg Hx    History  Substance Use Topics  . Smoking status: Current Every Day Smoker -- 0.25 packs/day for 12 years    Types: Cigarettes  .  Smokeless tobacco: Never Used  . Alcohol Use: No   OB History   Grav Para Term Preterm Abortions TAB SAB Ect Mult Living   2 2 2       2      Review of Systems  Constitutional: Negative for activity change and appetite change.  HENT: Negative for neck stiffness.   Eyes: Negative for pain.  Respiratory: Negative for chest tightness and shortness of breath.   Cardiovascular: Negative for chest pain and leg swelling.  Gastrointestinal: Positive for abdominal pain. Negative for nausea, vomiting and diarrhea.  Genitourinary: Negative for flank pain.  Musculoskeletal: Negative for back pain.  Skin: Negative for rash.  Neurological: Negative for weakness, numbness and headaches.  Psychiatric/Behavioral: Negative for behavioral problems.    Allergies  Latex; Baby oil; and Morphine and related  Home Medications   Current Outpatient Rx  Name  Route  Sig  Dispense  Refill  . Aspirin-Salicylamide-Caffeine (BC HEADACHE POWDER PO)   Oral   Take 1 packet by mouth daily as needed (headache).          BP 113/70  Pulse 70  Temp(Src) 97.9 F (36.6 C) (Oral)  Resp 16  SpO2 99% Physical Exam  Nursing note and vitals reviewed. Constitutional: She is oriented to person, place, and time. She appears well-developed and well-nourished.  HENT:  Head:  Normocephalic and atraumatic.  Eyes: EOM are normal. Pupils are equal, round, and reactive to light.  Neck: Normal range of motion. Neck supple.  Cardiovascular: Normal rate, regular rhythm and normal heart sounds.   No murmur heard. Pulmonary/Chest: Effort normal and breath sounds normal. No respiratory distress. She has no wheezes. She has no rales.  Abdominal: Soft. Bowel sounds are normal. She exhibits no distension. There is tenderness. There is no rebound and no guarding.  Patient with moderate mid to lower abdominal tenderness. No palpated hernias. There's a scar from previous C-sections  Genitourinary:  Mild vaginal discharge. Some  CMT.  Musculoskeletal: Normal range of motion.  Neurological: She is alert and oriented to person, place, and time. No cranial nerve deficit.  Skin: Skin is warm and dry.  Psychiatric: She has a normal mood and affect. Her speech is normal.    ED Course  Procedures (including critical care time) Labs Reviewed  WET PREP, GENITAL - Abnormal; Notable for the following:    WBC, Wet Prep HPF POC FEW (*)    All other components within normal limits  COMPREHENSIVE METABOLIC PANEL - Abnormal; Notable for the following:    Alkaline Phosphatase 27 (*)    All other components within normal limits  GC/CHLAMYDIA PROBE AMP  CBC WITH DIFFERENTIAL  LIPASE, BLOOD  URINALYSIS, ROUTINE W REFLEX MICROSCOPIC  POCT PREGNANCY, URINE   No results found. No diagnosis found.  MDM  Patient with abdominal pain. States abdomen distended. Lab work overall reassuring. CT scan pending. Care turned over to Dr Ranae Palms.  Juliet Rude. Rubin Payor, MD 02/11/13 1546

## 2013-02-12 LAB — GC/CHLAMYDIA PROBE AMP
CT Probe RNA: NEGATIVE
GC Probe RNA: NEGATIVE

## 2013-06-28 ENCOUNTER — Encounter (HOSPITAL_COMMUNITY): Payer: Self-pay | Admitting: Emergency Medicine

## 2013-06-28 ENCOUNTER — Emergency Department (HOSPITAL_COMMUNITY)
Admission: EM | Admit: 2013-06-28 | Discharge: 2013-06-28 | Discharge status: Against Medical Advice | Payer: Self-pay | Attending: Emergency Medicine | Admitting: Emergency Medicine

## 2013-06-28 ENCOUNTER — Emergency Department (HOSPITAL_COMMUNITY)
Admission: EM | Admit: 2013-06-28 | Discharge: 2013-06-28 | Disposition: A | Discharge status: Home / Self Care | Payer: Self-pay | Attending: Emergency Medicine | Admitting: Emergency Medicine

## 2013-06-28 DIAGNOSIS — R110 Nausea: Secondary | ICD-10-CM | POA: Insufficient documentation

## 2013-06-28 DIAGNOSIS — Z91040 Latex allergy status: Secondary | ICD-10-CM | POA: Insufficient documentation

## 2013-06-28 DIAGNOSIS — Z8742 Personal history of other diseases of the female genital tract: Secondary | ICD-10-CM | POA: Insufficient documentation

## 2013-06-28 DIAGNOSIS — R209 Unspecified disturbances of skin sensation: Secondary | ICD-10-CM | POA: Insufficient documentation

## 2013-06-28 DIAGNOSIS — R202 Paresthesia of skin: Secondary | ICD-10-CM

## 2013-06-28 DIAGNOSIS — Z8719 Personal history of other diseases of the digestive system: Secondary | ICD-10-CM | POA: Insufficient documentation

## 2013-06-28 DIAGNOSIS — Z3202 Encounter for pregnancy test, result negative: Secondary | ICD-10-CM | POA: Insufficient documentation

## 2013-06-28 DIAGNOSIS — F17200 Nicotine dependence, unspecified, uncomplicated: Secondary | ICD-10-CM | POA: Insufficient documentation

## 2013-06-28 DIAGNOSIS — R6883 Chills (without fever): Secondary | ICD-10-CM | POA: Insufficient documentation

## 2013-06-28 DIAGNOSIS — Z8619 Personal history of other infectious and parasitic diseases: Secondary | ICD-10-CM | POA: Insufficient documentation

## 2013-06-28 LAB — CBC WITH DIFFERENTIAL/PLATELET
Basophils Absolute: 0 10*3/uL (ref 0.0–0.1)
Basophils Relative: 0 % (ref 0–1)
Eosinophils Relative: 1 % (ref 0–5)
HCT: 37.8 % (ref 36.0–46.0)
Lymphocytes Relative: 22 % (ref 12–46)
MCHC: 36 g/dL (ref 30.0–36.0)
MCV: 91.5 fL (ref 78.0–100.0)
Monocytes Absolute: 0.5 10*3/uL (ref 0.1–1.0)
Neutro Abs: 6.5 10*3/uL (ref 1.7–7.7)
Platelets: 431 10*3/uL — ABNORMAL HIGH (ref 150–400)
RDW: 13.8 % (ref 11.5–15.5)
WBC: 9.2 10*3/uL (ref 4.0–10.5)

## 2013-06-28 LAB — COMPREHENSIVE METABOLIC PANEL
ALT: 11 U/L (ref 0–35)
AST: 15 U/L (ref 0–37)
Albumin: 4.5 g/dL (ref 3.5–5.2)
CO2: 25 mEq/L (ref 19–32)
Calcium: 9.4 mg/dL (ref 8.4–10.5)
Creatinine, Ser: 0.73 mg/dL (ref 0.50–1.10)
Sodium: 141 mEq/L (ref 135–145)

## 2013-06-28 LAB — URINE MICROSCOPIC-ADD ON

## 2013-06-28 LAB — URINALYSIS, ROUTINE W REFLEX MICROSCOPIC
Bilirubin Urine: NEGATIVE
Glucose, UA: NEGATIVE mg/dL
Specific Gravity, Urine: 1.015 (ref 1.005–1.030)
pH: 8 (ref 5.0–8.0)

## 2013-06-28 LAB — POCT PREGNANCY, URINE: Preg Test, Ur: NEGATIVE

## 2013-06-28 MED ORDER — PROMETHAZINE HCL 25 MG PO TABS: 25.0000 mg | ORAL_TABLET | Freq: Four times a day (QID) | ORAL | Status: DC | PRN

## 2013-06-28 NOTE — ED Notes (Addendum)
Nausea, without vomiting, tingling in fingers.  Pt was seen at [place], lab work drawn, After 6 hours she left

## 2013-06-28 NOTE — ED Provider Notes (Signed)
CSN: [id number]     Arrival date & time 06/28/13  1854 History   First MD Initiated Contact with Patient 06/28/13 2244     This chart was scribed for Vida Roller, MD by Arlan Organ, ED Scribe. This patient was seen in room APA01/AP[facility]01 and the patient's care was started 10:53 PM.   Chief Complaint  Patient presents with  . Nausea    HPI HPI Comments: Sophia Garcia is a 27 y.o. female with who presents to the Emergency Department complaining of nausea that started this afternoon. She also reports bilateral numbness and paraesthesia in her fingertips. Pt states she experienced the initial nausea after eating while she was at work today. She states she ate a cheeseburger that was prepared the day before. She states the nausea has some what been resolved, but is still experiencing the numbness and paraesthesia. Pt denies fever, sinus pressure, cough, emesis, dysuria, vaginal discharge, sore throat, or diarrhea. She denies any recent visual changes. She states she has had 2 C-section. Pt states she occasionally contracts yeast infections and urinary infections. No neurological complains over last several months.   Past Medical History  Diagnosis Date  . Yeast infection   . BV (bacterial vaginosis)   . Normal pregnancy, incidental 12/23/2011  . Preterm labor   . HPV (human papilloma virus) infection   . GERD (gastroesophageal reflux disease)    Past Surgical History  Procedure Laterality Date  . Cesarean section    . Tooth extraction  2009    ONE TOOTH  . Foot surgery  2002    STEPPED ON SEWING NEEDLE  . Cesarean section  06/12/2012    Procedure: CESAREAN SECTION;  Surgeon: Kirkland Hun, MD;  Location: [facility];  Service: Obstetrics;  Laterality: N/A;  Repeat  . Tubal ligation     Family History  Problem Relation Age of Onset  . Diabetes Father     ORAL MEDS  . Cancer Paternal Aunt     BREAST;LIVER  . Diabetes Paternal Aunt   . Diabetes Paternal Uncle   . COPD Paternal Uncle    . Cancer Maternal Grandmother     BREAST  . COPD Mother     EMPHYSEMA  . Diabetes Paternal Grandfather   . Other Neg Hx    History  Substance Use Topics  . Smoking status: Current Every Day Smoker -- 0.25 packs/day for 12 years    Types: Cigarettes  . Smokeless tobacco: Never Used  . Alcohol Use: No   OB History   Grav Para Term Preterm Abortions TAB SAB Ect Mult Living   2 2 2       2      Review of Systems  Constitutional: Negative for fever.  HENT: Negative for sore throat.   Respiratory: Negative for cough.   Gastrointestinal: Positive for nausea.  Neurological: Positive for numbness.  All other systems reviewed and are negative.    Allergies  Latex; Baby oil; and Morphine and related  Home Medications   Current Outpatient Rx  Name  Route  Sig  Dispense  Refill  . promethazine (PHENERGAN) 25 MG tablet   Oral   Take 1 tablet (25 mg total) by mouth every 6 (six) hours as needed for nausea or vomiting.   12 tablet   0     Triage Vitals: BP 130/76  Pulse 87  Temp(Src) 98.1 F (36.7 C) (Oral)  Resp 18  Ht 4\' 11"  (1.499 m)  Wt 189 lb (85.73  kg)  BMI 38.15 kg/m2  SpO2 100%  LMP 06/19/2013  Breastfeeding? No Physical Exam  Nursing note and vitals reviewed. Constitutional: She is oriented to person, place, and time. She appears well-developed and well-nourished. She is active.  HENT:  Head: Atraumatic.  Eyes: Pupils are equal, round, and reactive to light.  Neck: Normal range of motion.  Cardiovascular: Normal rate, regular rhythm, normal heart sounds and intact distal pulses.   Pulmonary/Chest: Effort normal and breath sounds normal.  Abdominal: Soft. Normal appearance.  Musculoskeletal: Normal range of motion.  Neurological: She is alert and oriented to person, place, and time. She has normal reflexes.  Slight decrease in sensation to the fingertips of the R hand, no numbness to the hand or forearm or upper arm.  No numbness to the other  extremities.  No weakness, no gait abnormality - normal speech and CN 3-12  Skin: Skin is warm.    ED Course  Procedures (including critical care time)  DIAGNOSTIC STUDIES: Oxygen Saturation is 97% on RA, Normal by my interpretation.    COORDINATION OF CARE: 10:58 PM-Discussed treatment plan with pt at bedside and pt agreed to plan.     Labs Review Labs Reviewed - No data to display Imaging Review No results found.  EKG Interpretation   None       MDM   1. Nausea   2. Paresthesia    Pt has normal VS - unlikley to be a focal neurological problem but possible MS - will have f/u for MRI if sx persist.  nause has resolved, labs from prior blood draw earlier today were totally normal.  I personally performed the services described in this documentation, which was scribed in my presence. The recorded information has been reviewed and is accurate.      Vida Roller, MD 06/29/13 [phone]

## 2013-06-28 NOTE — ED Notes (Signed)
Pt c/o nausea today after eating leftovers for lunch today; pt sts some tingling all over and chills but denies fever

## 2013-06-28 NOTE — ED Notes (Signed)
Patient ambulated to restroom, tolerated well. 

## 2013-12-02 ENCOUNTER — Emergency Department (HOSPITAL_COMMUNITY)
Admission: EM | Admit: 2013-12-02 | Discharge: 2013-12-03 | Disposition: A | Discharge status: Home / Self Care | Payer: Self-pay | Attending: Emergency Medicine | Admitting: Emergency Medicine

## 2013-12-02 ENCOUNTER — Encounter (HOSPITAL_COMMUNITY): Payer: Self-pay | Admitting: Emergency Medicine

## 2013-12-02 DIAGNOSIS — Z9889 Other specified postprocedural states: Secondary | ICD-10-CM | POA: Insufficient documentation

## 2013-12-02 DIAGNOSIS — F17200 Nicotine dependence, unspecified, uncomplicated: Secondary | ICD-10-CM | POA: Insufficient documentation

## 2013-12-02 DIAGNOSIS — R51 Headache: Secondary | ICD-10-CM | POA: Insufficient documentation

## 2013-12-02 DIAGNOSIS — N83209 Unspecified ovarian cyst, unspecified side: Secondary | ICD-10-CM | POA: Insufficient documentation

## 2013-12-02 DIAGNOSIS — N898 Other specified noninflammatory disorders of vagina: Secondary | ICD-10-CM | POA: Insufficient documentation

## 2013-12-02 DIAGNOSIS — Z91040 Latex allergy status: Secondary | ICD-10-CM | POA: Insufficient documentation

## 2013-12-02 DIAGNOSIS — R42 Dizziness and giddiness: Secondary | ICD-10-CM | POA: Insufficient documentation

## 2013-12-02 DIAGNOSIS — Z3202 Encounter for pregnancy test, result negative: Secondary | ICD-10-CM | POA: Insufficient documentation

## 2013-12-02 DIAGNOSIS — K8020 Calculus of gallbladder without cholecystitis without obstruction: Secondary | ICD-10-CM | POA: Insufficient documentation

## 2013-12-02 DIAGNOSIS — J3489 Other specified disorders of nose and nasal sinuses: Secondary | ICD-10-CM | POA: Insufficient documentation

## 2013-12-02 DIAGNOSIS — Z8619 Personal history of other infectious and parasitic diseases: Secondary | ICD-10-CM | POA: Insufficient documentation

## 2013-12-02 DIAGNOSIS — R6883 Chills (without fever): Secondary | ICD-10-CM | POA: Insufficient documentation

## 2013-12-02 DIAGNOSIS — R109 Unspecified abdominal pain: Secondary | ICD-10-CM

## 2013-12-02 LAB — CBC WITH DIFFERENTIAL/PLATELET
BASOS ABS: 0 10*3/uL (ref 0.0–0.1)
Basophils Relative: 0 % (ref 0–1)
Eosinophils Absolute: 0.1 10*3/uL (ref 0.0–0.7)
Eosinophils Relative: 2 % (ref 0–5)
HEMATOCRIT: 36.6 % (ref 36.0–46.0)
HEMOGLOBIN: 12.6 g/dL (ref 12.0–15.0)
LYMPHS PCT: 29 % (ref 12–46)
Lymphs Abs: 2.3 10*3/uL (ref 0.7–4.0)
MCH: 31.3 pg (ref 26.0–34.0)
MCHC: 34.4 g/dL (ref 30.0–36.0)
MCV: 91 fL (ref 78.0–100.0)
MONO ABS: 0.4 10*3/uL (ref 0.1–1.0)
Monocytes Relative: 5 % (ref 3–12)
NEUTROS ABS: 5 10*3/uL (ref 1.7–7.7)
NEUTROS PCT: 64 % (ref 43–77)
Platelets: 375 10*3/uL (ref 150–400)
RBC: 4.02 MIL/uL (ref 3.87–5.11)
RDW: 15 % (ref 11.5–15.5)
WBC: 7.9 10*3/uL (ref 4.0–10.5)

## 2013-12-02 LAB — URINALYSIS, ROUTINE W REFLEX MICROSCOPIC
Bilirubin Urine: NEGATIVE
GLUCOSE, UA: NEGATIVE mg/dL
HGB URINE DIPSTICK: NEGATIVE
Ketones, ur: NEGATIVE mg/dL
LEUKOCYTES UA: NEGATIVE
Nitrite: NEGATIVE
Protein, ur: NEGATIVE mg/dL
SPECIFIC GRAVITY, URINE: 1.02 (ref 1.005–1.030)
Urobilinogen, UA: 0.2 mg/dL (ref 0.0–1.0)
pH: 6 (ref 5.0–8.0)

## 2013-12-02 LAB — COMPREHENSIVE METABOLIC PANEL
ALBUMIN: 4.3 g/dL (ref 3.5–5.2)
ALK PHOS: 28 U/L — AB (ref 39–117)
ALT: 14 U/L (ref 0–35)
AST: 18 U/L (ref 0–37)
BILIRUBIN TOTAL: 0.3 mg/dL (ref 0.3–1.2)
BUN: 20 mg/dL (ref 6–23)
CHLORIDE: 103 meq/L (ref 96–112)
CO2: 25 meq/L (ref 19–32)
CREATININE: 0.68 mg/dL (ref 0.50–1.10)
Calcium: 9.4 mg/dL (ref 8.4–10.5)
GFR calc Af Amer: >90 mL/min (ref >90)
Glucose, Bld: 82 mg/dL (ref 70–99)
POTASSIUM: 4.1 meq/L (ref 3.7–5.3)
Sodium: 140 mEq/L (ref 137–147)
Total Protein: 7.5 g/dL (ref 6.0–8.3)

## 2013-12-02 LAB — PREGNANCY, URINE: PREG TEST UR: NEGATIVE

## 2013-12-02 NOTE — ED Provider Notes (Signed)
CSN: [id number]     Arrival date & time 12/02/13  2116 History   First MD Initiated Contact with Patient 12/02/13 2211     Chief Complaint  Patient presents with  . Abdominal Pain     (Consider location/radiation/quality/duration/timing/severity/associated sxs/prior Treatment) Patient is a 28 y.o. female presenting with abdominal pain. The history is provided by the patient.  Abdominal Pain Pain location:  LLQ and RLQ Pain quality: bloating   Pain severity:  Moderate Onset quality:  Gradual Duration:  8 weeks Timing:  Constant Progression:  Worsening Chronicity:  New Associated symptoms: chills and vaginal discharge   Associated symptoms: no chest pain, no dysuria, no fever, no nausea, no shortness of breath, no vaginal bleeding and no vomiting    Sophia Garcia is a 28 y.o. female who presents to the ED with abdominal pain that has been ongoing x 2 months. The pain is starting to get worse. She describes the pain as cramping and sharp. The pain comes and goes. G2P2, Last pap smear less than 2 years ago and was normal. Current sex partner x 8 years, BTL for birth control. No history of STI's.   Past Medical History  Diagnosis Date  . Yeast infection   . BV (bacterial vaginosis)   . Normal pregnancy, incidental 12/23/2011  . Preterm labor   . HPV (human papilloma virus) infection   . GERD (gastroesophageal reflux disease)    Past Surgical History  Procedure Laterality Date  . Cesarean section    . Tooth extraction  2009    ONE TOOTH  . Foot surgery  2002    STEPPED ON SEWING NEEDLE  . Cesarean section  06/12/2012    Procedure: CESAREAN SECTION;  Surgeon: Kirkland HunArthur Stringer, MD;  Location: [facility];  Service: Obstetrics;  Laterality: N/A;  Repeat  . Tubal ligation     Family History  Problem Relation Age of Onset  . Diabetes Father     ORAL MEDS  . Cancer Paternal Aunt     BREAST;LIVER  . Diabetes Paternal Aunt   . Diabetes Paternal Uncle   . COPD Paternal Uncle   .  Cancer Maternal Grandmother     BREAST  . COPD Mother     EMPHYSEMA  . Diabetes Paternal Grandfather   . Other Neg Hx    History  Substance Use Topics  . Smoking status: Current Every Day Smoker -- 0.50 packs/day for 12 years    Types: Cigarettes  . Smokeless tobacco: Never Used  . Alcohol Use: No   OB History   Grav Para Term Preterm Abortions TAB SAB Ect Mult Living   2 2 2       2      Review of Systems  Constitutional: Positive for chills. Negative for fever.  HENT: Positive for congestion.   Eyes: Negative for visual disturbance.  Respiratory: Negative for shortness of breath and wheezing.   Cardiovascular: Negative for chest pain.  Gastrointestinal: Positive for abdominal pain. Negative for nausea and vomiting.  Genitourinary: Positive for vaginal discharge. Negative for dysuria, urgency, frequency and vaginal bleeding.  Musculoskeletal: Positive for back pain and myalgias. Negative for neck stiffness.  Skin: Negative for rash.  Neurological: Positive for light-headedness and headaches. Negative for syncope.  Psychiatric/Behavioral: Negative for confusion. The patient is not nervous/anxious.       Allergies  Latex; Baby oil; and Morphine and related  Home Medications   Prior to Admission medications   Not on File   BP  133/84  Pulse 95  Resp 18  Ht 4\' 11"  (1.499 m)  Wt 87 lb (39.463 kg)  BMI 17.56 kg/m2  SpO2 100%  LMP 11/12/2013 Physical Exam  Nursing note and vitals reviewed. Constitutional: She is oriented to person, place, and time. She appears well-developed and well-nourished. No distress.  HENT:  Head: Normocephalic.  Eyes: Conjunctivae and EOM are normal.  Neck: Normal range of motion. Neck supple.  Cardiovascular: Normal rate and regular rhythm.   Pulmonary/Chest: Effort normal. She has no wheezes. She has no rales.  Abdominal: Soft. Bowel sounds are normal. There is tenderness in the left lower quadrant. There is no rebound, no guarding and no  CVA tenderness.  Musculoskeletal: Normal range of motion.  Neurological: She is alert and oriented to person, place, and time. No cranial nerve deficit.  Skin: Skin is warm and dry.  Psychiatric: She has a normal mood and affect. Her behavior is normal.   ED Course  Procedures (including critical care time) Labs Review Results for orders placed during the hospital encounter of 12/02/13 (from the past 24 hour(s))  URINALYSIS, ROUTINE W REFLEX MICROSCOPIC     Status: None   Collection Time    12/02/13  9:45 PM      Result Value Ref Range   Color, Urine YELLOW  YELLOW   APPearance CLEAR  CLEAR   Specific Gravity, Urine 1.020  1.005 - 1.030   pH 6.0  5.0 - 8.0   Glucose, UA NEGATIVE  NEGATIVE mg/dL   Hgb urine dipstick NEGATIVE  NEGATIVE   Bilirubin Urine NEGATIVE  NEGATIVE   Ketones, ur NEGATIVE  NEGATIVE mg/dL   Protein, ur NEGATIVE  NEGATIVE mg/dL   Urobilinogen, UA 0.2  0.0 - 1.0 mg/dL   Nitrite NEGATIVE  NEGATIVE   Leukocytes, UA NEGATIVE  NEGATIVE  PREGNANCY, URINE     Status: None   Collection Time    12/02/13  9:45 PM      Result Value Ref Range   Preg Test, Ur NEGATIVE  NEGATIVE  CBC WITH DIFFERENTIAL     Status: None   Collection Time    12/02/13 10:53 PM      Result Value Ref Range   WBC 7.9  4.0 - 10.5 K/uL   RBC 4.02  3.87 - 5.11 MIL/uL   Hemoglobin 12.6  12.0 - 15.0 g/dL   HCT 16.1  09.6 - 04.5 %   MCV 91.0  78.0 - 100.0 fL   MCH 31.3  26.0 - 34.0 pg   MCHC 34.4  30.0 - 36.0 g/dL   RDW 40.9  81.1 - 91.4 %   Platelets 375  150 - 400 K/uL   Neutrophils Relative % 64  43 - 77 %   Neutro Abs 5.0  1.7 - 7.7 K/uL   Lymphocytes Relative 29  12 - 46 %   Lymphs Abs 2.3  0.7 - 4.0 K/uL   Monocytes Relative 5  3 - 12 %   Monocytes Absolute 0.4  0.1 - 1.0 K/uL   Eosinophils Relative 2  0 - 5 %   Eosinophils Absolute 0.1  0.0 - 0.7 K/uL   Basophils Relative 0  0 - 1 %   Basophils Absolute 0.0  0.0 - 0.1 K/uL  COMPREHENSIVE METABOLIC PANEL     Status: Abnormal    Collection Time    12/02/13 10:53 PM      Result Value Ref Range   Sodium 140  137 - 147 mEq/L  Potassium 4.1  3.7 - 5.3 mEq/L   Chloride 103  96 - 112 mEq/L   CO2 25  19 - 32 mEq/L   Glucose, Bld 82  70 - 99 mg/dL   BUN 20  6 - 23 mg/dL   Creatinine, Ser 1.610.68  0.50 - 1.10 mg/dL   Calcium 9.4  8.4 - 09.610.5 mg/dL   Total Protein 7.5  6.0 - 8.3 g/dL   Albumin 4.3  3.5 - 5.2 g/dL   AST 18  0 - 37 U/L   ALT 14  0 - 35 U/L   Alkaline Phosphatase 28 (*) 39 - 117 U/L   Total Bilirubin 0.3  0.3 - 1.2 mg/dL   GFR calc non Af Amer >90  >90 mL/min   GFR calc Af Amer >90  >90 mL/min  WET PREP, GENITAL     Status: Abnormal   Collection Time    12/03/13 12:15 AM      Result Value Ref Range   Yeast Wet Prep HPF POC NONE SEEN  NONE SEEN   Trich, Wet Prep NONE SEEN  NONE SEEN   Clue Cells Wet Prep HPF POC NONE SEEN  NONE SEEN   WBC, Wet Prep HPF POC FEW (*) NONE SEEN     MDM  Patient awaiting CT scan. Care turned over to Dr. Dierdre Highmanpitz at [phone]. He will review results and determine disposition of the patient.     [place] JunctionHope M Neese, [place] 12/03/13 [phone]

## 2013-12-02 NOTE — ED Notes (Signed)
Pain in lower abd that has worsened over the past couple of months, states pain is worse when she urinates and she feels like her abd is bloated.

## 2013-12-03 ENCOUNTER — Emergency Department (HOSPITAL_COMMUNITY): Payer: Self-pay

## 2013-12-03 LAB — WET PREP, GENITAL
CLUE CELLS WET PREP: NONE SEEN
Trich, Wet Prep: NONE SEEN
Yeast Wet Prep HPF POC: NONE SEEN

## 2013-12-03 MED ORDER — ONDANSETRON HCL 4 MG PO TABS: 4.0000 mg | ORAL_TABLET | Freq: Four times a day (QID) | ORAL | Status: DC

## 2013-12-03 MED ORDER — IOHEXOL 300 MG/ML  SOLN
80.0000 mL | Freq: Once | INTRAMUSCULAR | Status: AC | PRN
  Administered 2013-12-03: 80 mL via INTRAVENOUS

## 2013-12-03 MED ORDER — TRAMADOL HCL 50 MG PO TABS: 50.0000 mg | ORAL_TABLET | Freq: Four times a day (QID) | ORAL | Status: DC | PRN

## 2013-12-03 MED ORDER — PANTOPRAZOLE SODIUM 40 MG IV SOLR: 40.0000 mg | Freq: Once | INTRAVENOUS | Status: DC

## 2013-12-03 MED ORDER — IOHEXOL 300 MG/ML  SOLN
50.0000 mL | Freq: Once | INTRAMUSCULAR | Status: AC | PRN
  Administered 2013-12-03: 50 mL via ORAL

## 2013-12-03 MED ORDER — IBUPROFEN 800 MG PO TABS: 800.0000 mg | ORAL_TABLET | Freq: Three times a day (TID) | ORAL | Status: DC

## 2013-12-03 NOTE — ED Provider Notes (Signed)
Medical screening examination/treatment/procedure(s) were conducted as a shared visit with non-physician practitioner(s) and myself.  I personally evaluated the patient during the encounter.  Left lower quadrant abdominal pain ongoing for months, sharp in quality and feels similar to previous hemorrhagic ovarian cyst. No fevers or chills. No vomiting or diarrhea. On exam has tenderness left lower quadrant with some voluntary guarding. Mild right lower quadrant tenderness without rebound or guarding. No abdominal tenderness otherwise. Heart regular rate and rhythm and lungs clear. No CVA tenderness.  Labs, urinalysis and CT scan reviewed. Diagnosis: Hemorrhagic ovarian cysts.   Patient aware of CT findings including cholelithiasis and radiologist's recommendation for followup ultrasound. Outpatient referral provided. Prescription for shortness pain medications provided. Prescription for Motrin 800 mg provided. Vital signs and nursing notes reviewed and considered. Return precautions verbalized as understood.  Sophia NielsenBrian Jermeka Schlotterbeck, MD 12/03/13 [phone]

## 2013-12-03 NOTE — Discharge Instructions (Signed)
Abdominal Pain, Women °Abdominal (stomach, pelvic, or belly) pain can be caused by many things. It is important to tell your doctor: °· The location of the pain. °· Does it come and go or is it present all the time? °· Are there things that start the pain (eating certain foods, exercise)? °· Are there other symptoms associated with the pain (fever, nausea, vomiting, diarrhea)? °All of this is helpful to know when trying to find the cause of the pain. °CAUSES  °· Stomach: virus or bacteria infection, or ulcer. °· Intestine: appendicitis (inflamed appendix), regional ileitis (Crohn's disease), ulcerative colitis (inflamed colon), irritable bowel syndrome, diverticulitis (inflamed diverticulum of the colon), or cancer of the stomach or intestine. °· Gallbladder disease or stones in the gallbladder. °· Kidney disease, kidney stones, or infection. °· Pancreas infection or cancer. °· Fibromyalgia (pain disorder). °· Diseases of the female organs: °· Uterus: fibroid (non-cancerous) tumors or infection. °· Fallopian tubes: infection or tubal pregnancy. °· Ovary: cysts or tumors. °· Pelvic adhesions (scar tissue). °· Endometriosis (uterus lining tissue growing in the pelvis and on the pelvic organs). °· Pelvic congestion syndrome (female organs filling up with blood just before the menstrual period). °· Pain with the menstrual period. °· Pain with ovulation (producing an egg). °· Pain with an IUD (intrauterine device, birth control) in the uterus. °· Cancer of the female organs. °· Functional pain (pain not caused by a disease, may improve without treatment). °· Psychological pain. °· Depression. °DIAGNOSIS  °Your doctor will decide the seriousness of your pain by doing an examination. °· Blood tests. °· X-rays. °· Ultrasound. °· CT scan (computed tomography, special type of X-ray). °· MRI (magnetic resonance imaging). °· Cultures, for infection. °· Barium enema (dye inserted in the large intestine, to better view it with  X-rays). °· Colonoscopy (looking in intestine with a lighted tube). °· Laparoscopy (minor surgery, looking in abdomen with a lighted tube). °· Major abdominal exploratory surgery (looking in abdomen with a large incision). °TREATMENT  °The treatment will depend on the cause of the pain.  °· Many cases can be observed and treated at home. °· Over-the-counter medicines recommended by your caregiver. °· Prescription medicine. °· Antibiotics, for infection. °· Birth control pills, for painful periods or for ovulation pain. °· Hormone treatment, for endometriosis. °· Nerve blocking injections. °· Physical therapy. °· Antidepressants. °· Counseling with a psychologist or psychiatrist. °· Minor or major surgery. °HOME CARE INSTRUCTIONS  °· Do not take laxatives, unless directed by your caregiver. °· Take over-the-counter pain medicine only if ordered by your caregiver. Do not take aspirin because it can cause an upset stomach or bleeding. °· Try a clear liquid diet (broth or water) as ordered by your caregiver. Slowly move to a bland diet, as tolerated, if the pain is related to the stomach or intestine. °· Have a thermometer and take your temperature several times a day, and record it. °· Bed rest and sleep, if it helps the pain. °· Avoid sexual intercourse, if it causes pain. °· Avoid stressful situations. °· Keep your follow-up appointments and tests, as your caregiver orders. °· If the pain does not go away with medicine or surgery, you may try: °· Acupuncture. °· Relaxation exercises (yoga, meditation). °· Group therapy. °· Counseling. °SEEK MEDICAL CARE IF:  °· You notice certain foods cause stomach pain. °· Your home care treatment is not helping your pain. °· You need stronger pain medicine. °· You want your IUD removed. °· You feel faint or   lightheaded. °· You develop nausea and vomiting. °· You develop a rash. °· You are having side effects or an allergy to your medicine. °SEEK IMMEDIATE MEDICAL CARE IF:  °· Your  pain does not go away or gets worse. °· You have a fever. °· Your pain is felt only in portions of the abdomen. The right side could possibly be appendicitis. The left lower portion of the abdomen could be colitis or diverticulitis. °· You are passing blood in your stools (bright red or black tarry stools, with or without vomiting). °· You have blood in your urine. °· You develop chills, with or without a fever. °· You pass out. °MAKE SURE YOU:  °· Understand these instructions. °· Will watch your condition. °· Will get help right away if you are not doing well or get worse. °Document Released: 06/02/2007 Document Revised: 10/28/2011 Document Reviewed: 06/22/2009 °[employer]® Patient Information ©2014 [employer]. ° °

## 2013-12-04 LAB — GC/CHLAMYDIA PROBE AMP
CT Probe RNA: NEGATIVE
GC PROBE AMP APTIMA: NEGATIVE

## 2013-12-30 ENCOUNTER — Encounter (HOSPITAL_COMMUNITY): Payer: Self-pay | Admitting: Pharmacy Technician

## 2013-12-30 NOTE — Patient Instructions (Addendum)
Your procedure is scheduled on: 01/03/2014  [name] at  8:30   AM.  Call this number if you have problems the morning of surgery: [phone]   Remember:   Do not drink or eat food:After Midnight.  :  Take these medicines the morning of surgery with A SIP OF WATER: None   Do not wear jewelry, make-up or nail polish.  Do not wear lotions, powders, or perfumes. You may wear deodorant.  Do not shave 48 hours prior to surgery. Men may shave face and neck.  Do not bring valuables to the hospital.  Contacts, dentures or bridgework may not be worn into surgery.  Leave suitcase in the car. After surgery it may be brought to your room.  For patients admitted to the hospital, checkout time is 11:00 AM the day of discharge.   Patients discharged the day of surgery will not be allowed to drive home.    Special Instructions: Shower using CHG night before surgery and shower the day of surgery use CHG.  Use special wash - you have one bottle of CHG for all showers.  You should use approximately 1/2 of the bottle for each shower.   Please read over the following fact sheets that you were given: Pain Booklet, MRSA Information, Surgical Site Infection Prevention and Care and Recovery After Surgery  Laparoscopic Cholecystectomy, Care After Refer to this sheet in the next few weeks. These instructions provide you with information on caring for yourself after your procedure. Your health care provider may also give you more specific instructions. Your treatment has been planned according to current medical practices, but problems sometimes occur. Call your health care provider if you have any problems or questions after your procedure. WHAT TO EXPECT AFTER THE PROCEDURE After your procedure, it is typical to have the following:  Pain at your incision sites. You will be given pain medicines to control the pain.  Mild nausea or vomiting. This should improve after the first 24 hours.  Bloating and  possibly shoulder pain from the gas used during the procedure. This will improve after the first 24 hours. HOME CARE INSTRUCTIONS   Change bandages (dressings) as directed by your health care provider.  Keep the wound dry and clean. You may wash the wound gently with soap and water. Gently blot or dab the area dry.  Do not take baths or use swimming pools or hot tubs for 2 weeks or until your health care provider approves.  Only take over-the-counter or prescription medicines as directed by your health care provider.  Continue your normal diet as directed by your health care provider.  Do not lift anything heavier than 10 pounds (4.5 kg) until your health care provider approves.  Do not play contact sports for 1 week or until your health care provider approves. SEEK MEDICAL CARE IF:   You have redness, swelling, or increasing pain in the wound.  You notice yellowish-Sophia Garcia fluid (pus) coming from the wound.  You have drainage from the wound that lasts longer than 1 day.  You notice a bad smell coming from the wound or dressing.  Your surgical cuts (incisions) break open. SEEK IMMEDIATE MEDICAL CARE IF:   You develop a rash.  You have difficulty breathing.  You have chest pain.  You have a fever.  You have increasing pain in the shoulders (shoulder strap areas).  You have dizzy episodes or faint while standing.  You have severe abdominal pain.  You feel  sick to your stomach (nauseous) or throw up (vomit) and this lasts for more than 1 day. Document Released: 08/05/2005 Document Revised: 05/26/2013 Document Reviewed: 03/17/2013 [facility] Patient Information 2014 [place]. General Anesthesia, Adult, Care After Refer to this sheet in the next few weeks. These instructions provide you with information on caring for yourself after your procedure. Your health care provider may also give you more specific instructions. Your treatment has been planned according to current  medical practices, but problems sometimes occur. Call your health care provider if you have any problems or questions after your procedure. WHAT TO EXPECT AFTER THE PROCEDURE After the procedure, it is typical to experience:  Sleepiness.  Nausea and vomiting. HOME CARE INSTRUCTIONS  For the first 24 hours after general anesthesia:  Have a responsible person with you.  Do not drive a car. If you are alone, do not take public transportation.  Do not drink alcohol.  Do not take medicine that has not been prescribed by your health care provider.  Do not sign important papers or make important decisions.  You may resume a normal diet and activities as directed by your health care provider.  Change bandages (dressings) as directed.  If you have questions or problems that seem related to general anesthesia, call the hospital and ask for the anesthetist or anesthesiologist on call. SEEK MEDICAL CARE IF:  You have nausea and vomiting that continue the day after anesthesia.  You develop a rash. SEEK IMMEDIATE MEDICAL CARE IF:   You have difficulty breathing.  You have chest pain.  You have any allergic problems. Document Released: 11/11/2000 Document Revised: 04/07/2013 Document Reviewed: 02/18/2013 [facility] Patient Information 2014 [place], [place].

## 2013-12-30 NOTE — H&P (Signed)
  NTS SOAP Note  Vital Signs:  Vitals as of: 12/28/2013: Systolic 112: Diastolic 68: Heart Rate 76: Temp 98.39F: Height 824ft 11in: Weight 90Lbs 0 Ounces: BMI 18.18  BMI : 18.18 kg/m2  Subjective: This 28 Years 564 Months old Female presents for of abdominal pain.  Has been having intermittent right upper quadrant abdominal pain with radiation to the back, nausea, and fatty food intolerance for some time now.  Seen in ER and found to have cholelithiasis and hemorrhagic ovarian cysts.  Pelvic pain is gone.  Has had weight loss since being on bland diet.  No fever, chills,  jaundice.    Review of Symptoms:  Constitutional:  weight loss,fatigue    headache Eyes:unremarkable   sinus problems Cardiovascular:  unremarkable   Respiratory:  cough Gastrointestin    abdominal pain,nausea,dyspepsia Genitourinary:    dysuria   joint, back, and neck pain dry Hematolgic/Lymphatic:unremarkable     Allergic/Immunologic:unremarkable     Past Medical History:    Reviewed  Past Medical History  Surgical History: c sections Medical Problems: none Allergies: morphine, dark chocolate Medications: tramadol for pain   Social History:Reviewed  Social History  Preferred Language: English Race:  White Ethnicity: Not Hispanic / Latino Age: 28 Years 4 Months Marital Status:  M Alcohol: no Recreational drug(s): no   Smoking Status: Light tobacco smoker reviewed on 12/28/2013 Started Date:  Packs per week: 2.00 Functional Status reviewed on 12/28/2013 ------------------------------------------------ Bathing: Normal Cooking: Normal Dressing: Normal Driving: Normal Eating: Normal Managing Meds: Normal Oral Care: Normal Shopping: Normal Toileting: Normal Transferring: Normal Walking: Normal Cognitive Status reviewed on 12/28/2013 ------------------------------------------------ Attention: Normal Decision Making: Normal Language: Normal Memory:  Normal Motor: Normal Perception: Normal Problem Solving: Normal Visual and Spatial: Normal   Family History:  Reviewed  Family Health History Mother, Living; Healthy; healthy Father, Living; Diabetes mellitus, unspecified type;     Objective Information: General:  Well appearing, well nourished in no distress.   no scleral icterus Heart:  RRR, no murmur or gallop.  Normal S1, S2.  No S3, S4.  Lungs:    CTA bilaterally, no wheezes, rhonchi, rales.  Breathing unlabored. Abdomen:Soft, slight tenderness in right upper quadrant to palpation, ND, normal bowel sounds, no HSM, no masses.  No peritoneal signs.  Assessment:Cholelithiasis  Diagnoses: 574.20 Gallstone (Calculus of gallbladder without cholecystitis without obstruction)  Procedures: [id number] - OFFICE OUTPATIENT NEW 30 MINUTES    Plan:Will call to schedule laparoscopic cholecystectomy.   Patient Education:Alternative treatments to surgery were discussed with patient (and family).  Risks and benefits  of procedure including bleeding, infection, hepatobiliary injury, and the possibility of an open procedure were fully explained to the patient (and family) who gave informed consent. Patient/family questions were addressed.  Follow-up:Pending Surgery

## 2013-12-31 ENCOUNTER — Encounter (HOSPITAL_COMMUNITY): Payer: Self-pay

## 2013-12-31 ENCOUNTER — Encounter (HOSPITAL_COMMUNITY)
Admission: RE | Admit: 2013-12-31 | Discharge: 2013-12-31 | Disposition: A | Discharge status: Home / Self Care | Payer: Self-pay | Source: Ambulatory Visit | Attending: General Surgery | Admitting: General Surgery

## 2013-12-31 DIAGNOSIS — Z01812 Encounter for preprocedural laboratory examination: Secondary | ICD-10-CM | POA: Insufficient documentation

## 2013-12-31 MED ORDER — CHLORHEXIDINE GLUCONATE 4 % EX LIQD
1.0000 | Freq: Once | CUTANEOUS | Status: DC
Start: 2013-12-31 — End: 2014-01-01

## 2014-01-03 ENCOUNTER — Encounter (HOSPITAL_COMMUNITY): Payer: Self-pay | Admitting: *Deleted

## 2014-01-03 ENCOUNTER — Ambulatory Visit (HOSPITAL_COMMUNITY): Payer: Self-pay | Admitting: Anesthesiology

## 2014-01-03 ENCOUNTER — Encounter (HOSPITAL_COMMUNITY): Payer: Self-pay | Admitting: Anesthesiology

## 2014-01-03 ENCOUNTER — Ambulatory Visit (HOSPITAL_COMMUNITY)
Admission: RE | Admit: 2014-01-03 | Discharge: 2014-01-03 | Disposition: A | Discharge status: Home / Self Care | Payer: Self-pay | Source: Ambulatory Visit | Attending: General Surgery | Admitting: General Surgery

## 2014-01-03 ENCOUNTER — Encounter (HOSPITAL_COMMUNITY)
Admission: RE | Disposition: A | Discharge status: Home / Self Care | Payer: Self-pay | Source: Ambulatory Visit | Attending: General Surgery

## 2014-01-03 DIAGNOSIS — K8010 Calculus of gallbladder with chronic cholecystitis without obstruction: Secondary | ICD-10-CM | POA: Insufficient documentation

## 2014-01-03 DIAGNOSIS — F17200 Nicotine dependence, unspecified, uncomplicated: Secondary | ICD-10-CM | POA: Insufficient documentation

## 2014-01-03 DIAGNOSIS — Z833 Family history of diabetes mellitus: Secondary | ICD-10-CM | POA: Insufficient documentation

## 2014-01-03 DIAGNOSIS — N83209 Unspecified ovarian cyst, unspecified side: Secondary | ICD-10-CM | POA: Insufficient documentation

## 2014-01-03 DIAGNOSIS — R634 Abnormal weight loss: Secondary | ICD-10-CM | POA: Insufficient documentation

## 2014-01-03 DIAGNOSIS — Z681 Body mass index (BMI) 19 or less, adult: Secondary | ICD-10-CM | POA: Insufficient documentation

## 2014-01-03 DIAGNOSIS — Z885 Allergy status to narcotic agent status: Secondary | ICD-10-CM | POA: Insufficient documentation

## 2014-01-03 HISTORY — PX: CHOLECYSTECTOMY: SHX55

## 2014-01-03 SURGERY — LAPAROSCOPIC CHOLECYSTECTOMY
Anesthesia: General | Site: Abdomen

## 2014-01-03 MED ORDER — ONDANSETRON HCL 4 MG/2ML IJ SOLN
4.0000 mg | Freq: Once | INTRAMUSCULAR | Status: AC | PRN
  Administered 2014-01-03: 4 mg via INTRAVENOUS
  Filled 2014-01-03: qty 2

## 2014-01-03 MED ORDER — DEXTROSE 5 % IV SOLN
INTRAVENOUS | Status: DC | PRN
  Administered 2014-01-03: 10:00:00 via INTRAVENOUS

## 2014-01-03 MED ORDER — CIPROFLOXACIN IN D5W 400 MG/200ML IV SOLN
INTRAVENOUS | Status: DC | PRN
  Administered 2014-01-03: 400 mg via INTRAVENOUS

## 2014-01-03 MED ORDER — MIDAZOLAM HCL 2 MG/2ML IJ SOLN
INTRAMUSCULAR | Status: AC
  Filled 2014-01-03: qty 2

## 2014-01-03 MED ORDER — ONDANSETRON HCL 4 MG/2ML IJ SOLN
INTRAMUSCULAR | Status: AC
  Filled 2014-01-03: qty 2

## 2014-01-03 MED ORDER — LACTATED RINGERS IV SOLN
INTRAVENOUS | Status: DC
  Administered 2014-01-03: 500 mL via INTRAVENOUS
  Administered 2014-01-03: 09:00:00 via INTRAVENOUS

## 2014-01-03 MED ORDER — MIDAZOLAM HCL 2 MG/2ML IJ SOLN
1.0000 mg | INTRAMUSCULAR | Status: DC | PRN
  Administered 2014-01-03: 2 mg via INTRAVENOUS

## 2014-01-03 MED ORDER — BUPIVACAINE HCL (PF) 0.5 % IJ SOLN
INTRAMUSCULAR | Status: AC
  Filled 2014-01-03: qty 30

## 2014-01-03 MED ORDER — FENTANYL CITRATE 0.05 MG/ML IJ SOLN
INTRAMUSCULAR | Status: DC | PRN
  Administered 2014-01-03 (×2): 100 ug via INTRAVENOUS

## 2014-01-03 MED ORDER — FENTANYL CITRATE 0.05 MG/ML IJ SOLN
25.0000 ug | INTRAMUSCULAR | Status: DC | PRN
  Administered 2014-01-03 (×2): 50 ug via INTRAVENOUS
  Administered 2014-01-03: 25 ug via INTRAVENOUS
  Administered 2014-01-03: 50 ug via INTRAVENOUS
  Administered 2014-01-03: 25 ug via INTRAVENOUS
  Filled 2014-01-03 (×2): qty 2

## 2014-01-03 MED ORDER — SUCCINYLCHOLINE CHLORIDE 20 MG/ML IJ SOLN
INTRAMUSCULAR | Status: AC
  Filled 2014-01-03: qty 1

## 2014-01-03 MED ORDER — LIDOCAINE HCL (PF) 1 % IJ SOLN
INTRAMUSCULAR | Status: AC
  Filled 2014-01-03: qty 5

## 2014-01-03 MED ORDER — PROPOFOL 10 MG/ML IV BOLUS
INTRAVENOUS | Status: DC | PRN
  Administered 2014-01-03: 120 mg via INTRAVENOUS

## 2014-01-03 MED ORDER — SUCCINYLCHOLINE CHLORIDE 20 MG/ML IJ SOLN
INTRAMUSCULAR | Status: DC | PRN
  Administered 2014-01-03: 130 mg via INTRAVENOUS

## 2014-01-03 MED ORDER — FENTANYL CITRATE 0.05 MG/ML IJ SOLN
INTRAMUSCULAR | Status: AC
  Filled 2014-01-03: qty 5

## 2014-01-03 MED ORDER — KETOROLAC TROMETHAMINE 30 MG/ML IJ SOLN
INTRAMUSCULAR | Status: AC
  Filled 2014-01-03: qty 1

## 2014-01-03 MED ORDER — GLYCOPYRROLATE 0.2 MG/ML IJ SOLN
INTRAMUSCULAR | Status: AC
  Filled 2014-01-03: qty 1

## 2014-01-03 MED ORDER — POVIDONE-IODINE 10 % OINT PACKET
TOPICAL_OINTMENT | CUTANEOUS | Status: DC | PRN
  Administered 2014-01-03: 1 via TOPICAL

## 2014-01-03 MED ORDER — MIDAZOLAM HCL 5 MG/5ML IJ SOLN
INTRAMUSCULAR | Status: DC | PRN
  Administered 2014-01-03: 2 mg via INTRAVENOUS

## 2014-01-03 MED ORDER — CIPROFLOXACIN IN D5W 400 MG/200ML IV SOLN
400.0000 mg | INTRAVENOUS | Status: DC
  Filled 2014-01-03: qty 200

## 2014-01-03 MED ORDER — SODIUM CHLORIDE 0.9 % IR SOLN
Status: DC | PRN
Start: 2014-01-03 — End: 2014-01-03
  Administered 2014-01-03: 1000 mL

## 2014-01-03 MED ORDER — HYDROCODONE-ACETAMINOPHEN 5-325 MG PO TABS: 1.0000 | ORAL_TABLET | Freq: Four times a day (QID) | ORAL | Status: DC | PRN

## 2014-01-03 MED ORDER — ONDANSETRON HCL 4 MG/2ML IJ SOLN
4.0000 mg | Freq: Once | INTRAMUSCULAR | Status: AC
  Administered 2014-01-03: 4 mg via INTRAVENOUS

## 2014-01-03 MED ORDER — KETOROLAC TROMETHAMINE 30 MG/ML IJ SOLN
15.0000 mg | Freq: Once | INTRAMUSCULAR | Status: AC
  Administered 2014-01-03: 15 mg via INTRAVENOUS

## 2014-01-03 MED ORDER — GLYCOPYRROLATE 0.2 MG/ML IJ SOLN
0.2000 mg | Freq: Once | INTRAMUSCULAR | Status: AC
  Administered 2014-01-03: 0.2 mg via INTRAVENOUS

## 2014-01-03 MED ORDER — BUPIVACAINE HCL (PF) 0.5 % IJ SOLN
INTRAMUSCULAR | Status: DC | PRN
  Administered 2014-01-03: 4 mL

## 2014-01-03 MED ORDER — LIDOCAINE HCL 1 % IJ SOLN
INTRAMUSCULAR | Status: DC | PRN
  Administered 2014-01-03: 35 mg via INTRADERMAL

## 2014-01-03 MED ORDER — PROPOFOL 10 MG/ML IV BOLUS
INTRAVENOUS | Status: AC
  Filled 2014-01-03: qty 20

## 2014-01-03 MED ORDER — POVIDONE-IODINE 10 % EX OINT
TOPICAL_OINTMENT | CUTANEOUS | Status: AC
  Filled 2014-01-03: qty 1

## 2014-01-03 MED ORDER — HEMOSTATIC AGENTS (NO CHARGE) OPTIME
TOPICAL | Status: DC | PRN
  Administered 2014-01-03: 1 via TOPICAL

## 2014-01-03 SURGICAL SUPPLY — 46 items
APPLIER CLIP LAPSCP 10X32 DD (CLIP) ×3 IMPLANT
BAG HAMPER (MISCELLANEOUS) ×3 IMPLANT
BAG SPEC RTRVL LRG 6X4 10 (ENDOMECHANICALS) ×1
CLOTH BEACON ORANGE TIMEOUT ST (SAFETY) ×3 IMPLANT
COVER LIGHT HANDLE STERIS (MISCELLANEOUS) ×6 IMPLANT
DECANTER SPIKE VIAL GLASS SM (MISCELLANEOUS) ×3 IMPLANT
DURAPREP 26ML APPLICATOR (WOUND CARE) ×3 IMPLANT
ELECT REM PT RETURN 9FT ADLT (ELECTROSURGICAL) ×3
ELECTRODE REM PT RTRN 9FT ADLT (ELECTROSURGICAL) ×1 IMPLANT
FILTER SMOKE EVAC LAPAROSHD (FILTER) ×3 IMPLANT
FORMALIN 10 PREFIL 120ML (MISCELLANEOUS) ×3 IMPLANT
GLOVE BIOGEL PI IND STRL 7.0 (GLOVE) IMPLANT
GLOVE BIOGEL PI IND STRL 7.5 (GLOVE) IMPLANT
GLOVE BIOGEL PI INDICATOR 7.0 (GLOVE) ×4
GLOVE BIOGEL PI INDICATOR 7.5 (GLOVE) ×2
GLOVE ECLIPSE 6.5 STRL STRAW (GLOVE) ×2 IMPLANT
GLOVE ECLIPSE 7.0 STRL STRAW (GLOVE) ×2 IMPLANT
GLOVE SKINSENSE NS SZ7.0 (GLOVE) ×4
GLOVE SKINSENSE STRL SZ7.0 (GLOVE) IMPLANT
GLOVE SURG SS PI 7.5 STRL IVOR (GLOVE) ×6 IMPLANT
GOWN STRL REUS W/TWL LRG LVL3 (GOWN DISPOSABLE) ×9 IMPLANT
HEMOSTAT SNOW SURGICEL 2X4 (HEMOSTASIS) ×3 IMPLANT
INST SET LAPROSCOPIC AP (KITS) ×3 IMPLANT
IV NS IRRIG 3000ML ARTHROMATIC (IV SOLUTION) IMPLANT
KIT ROOM TURNOVER APOR (KITS) ×3 IMPLANT
MANIFOLD NEPTUNE II (INSTRUMENTS) ×3 IMPLANT
NDL INSUFFLATION 14GA 120MM (NEEDLE) ×1 IMPLANT
NEEDLE INSUFFLATION 14GA 120MM (NEEDLE) ×3 IMPLANT
NS IRRIG 1000ML POUR BTL (IV SOLUTION) ×3 IMPLANT
PACK LAP CHOLE LZT030E (CUSTOM PROCEDURE TRAY) ×3 IMPLANT
PAD ARMBOARD 7.5X6 YLW CONV (MISCELLANEOUS) ×3 IMPLANT
POUCH SPECIMEN RETRIEVAL 10MM (ENDOMECHANICALS) ×3 IMPLANT
SET BASIN LINEN APH (SET/KITS/TRAYS/PACK) ×3 IMPLANT
SET TUBE IRRIG SUCTION NO TIP (IRRIGATION / IRRIGATOR) IMPLANT
SLEEVE ENDOPATH XCEL 5M (ENDOMECHANICALS) ×3 IMPLANT
SPONGE GAUZE 2X2 8PLY STER LF (GAUZE/BANDAGES/DRESSINGS) ×4
SPONGE GAUZE 2X2 8PLY STRL LF (GAUZE/BANDAGES/DRESSINGS) ×8 IMPLANT
STAPLER VISISTAT (STAPLE) ×3 IMPLANT
SUT VICRYL 0 UR6 27IN ABS (SUTURE) ×3 IMPLANT
TAPE CLOTH SURG 4X10 WHT LF (GAUZE/BANDAGES/DRESSINGS) ×2 IMPLANT
TROCAR ENDO BLADELESS 11MM (ENDOMECHANICALS) ×3 IMPLANT
TROCAR XCEL NON-BLD 5MMX100MML (ENDOMECHANICALS) ×3 IMPLANT
TROCAR XCEL UNIV SLVE 11M 100M (ENDOMECHANICALS) ×3 IMPLANT
TUBING INSUFFLATION (TUBING) ×3 IMPLANT
WARMER LAPAROSCOPE (MISCELLANEOUS) ×3 IMPLANT
YANKAUER SUCT 12FT TUBE ARGYLE (SUCTIONS) ×3 IMPLANT

## 2014-01-03 NOTE — Discharge Instructions (Signed)
Laparoscopic Cholecystectomy, Care After °Refer to this sheet in the next few weeks. These instructions provide you with information on caring for yourself after your procedure. Your health care provider may also give you more specific instructions. Your treatment has been planned according to current medical practices, but problems sometimes occur. Call your health care provider if you have any problems or questions after your procedure. °WHAT TO EXPECT AFTER THE PROCEDURE °After your procedure, it is typical to have the following: °· Pain at your incision sites. You will be given pain medicines to control the pain. °· Mild nausea or vomiting. This should improve after the first 24 hours. °· Bloating and possibly shoulder pain from the gas used during the procedure. This will improve after the first 24 hours. °HOME CARE INSTRUCTIONS  °· Change bandages (dressings) as directed by your health care provider. °· Keep the wound dry and clean. You may wash the wound gently with soap and water. Gently blot or dab the area dry. °· Do not take baths or use swimming pools or hot tubs for 2 weeks or until your health care provider approves. °· Only take over-the-counter or prescription medicines as directed by your health care provider. °· Continue your normal diet as directed by your health care provider. °· Do not lift anything heavier than 10 pounds (4.5 kg) until your health care provider approves. °· Do not play contact sports for 1 week or until your health care provider approves. °SEEK MEDICAL CARE IF:  °· You have redness, swelling, or increasing pain in the wound. °· You notice yellowish-white fluid (pus) coming from the wound. °· You have drainage from the wound that lasts longer than 1 day. °· You notice a bad smell coming from the wound or dressing. °· Your surgical cuts (incisions) break open. °SEEK IMMEDIATE MEDICAL CARE IF:  °· You develop a rash. °· You have difficulty breathing. °· You have chest pain. °· You  have a fever. °· You have increasing pain in the shoulders (shoulder strap areas). °· You have dizzy episodes or faint while standing. °· You have severe abdominal pain. °· You feel sick to your stomach (nauseous) or throw up (vomit) and this lasts for more than 1 day. ° °

## 2014-01-03 NOTE — Anesthesia Procedure Notes (Signed)
Procedure Name: Intubation Date/Time: 01/03/2014 10:09 AM Performed by: Despina HiddenIDACAVAGE, Jernard Reiber J Pre-anesthesia Checklist: Patient being monitored, Suction available, Emergency Drugs available and Patient identified Patient Re-evaluated:Patient Re-evaluated prior to inductionOxygen Delivery Method: Circle system utilized Preoxygenation: Pre-oxygenation with 100% oxygen Intubation Type: IV induction Ventilation: Mask ventilation without difficulty Laryngoscope Size: Mac and 3 Grade View: Grade I Tube type: Oral Tube size: 6.0 mm Number of attempts: 1 Airway Equipment and Method: Stylet Placement Confirmation: ETT inserted through vocal cords under direct vision,  positive ETCO2 and breath sounds checked- equal and bilateral Secured at: 22 cm Tube secured with: Tape Dental Injury: Teeth and Oropharynx as per pre-operative assessment

## 2014-01-03 NOTE — Interval H&P Note (Signed)
History and Physical Interval Note:  01/03/2014 9:36 AM  Sophia Garcia  has presented today for surgery, with the diagnosis of cholelithiasis  The various methods of treatment have been discussed with the patient and family. After consideration of risks, benefits and other options for treatment, the patient has consented to  Procedure(s): LAPAROSCOPIC CHOLECYSTECTOMY (N/A) as a surgical intervention .  The patient's history has been reviewed, patient examined, no change in status, stable for surgery.  I have reviewed the patient's chart and labs.  Questions were answered to the patient's satisfaction.     Dalia HeadingMark A Oliwia Berzins

## 2014-01-03 NOTE — Anesthesia Postprocedure Evaluation (Signed)
  Anesthesia Post-op Note  Patient: Sophia Garcia  Procedure(s) Performed: Procedure(s): LAPAROSCOPIC CHOLECYSTECTOMY (N/A)  Patient Location: PACU  Anesthesia Type:General  Level of Consciousness: awake, alert , oriented and patient cooperative  Airway and Oxygen Therapy: Patient Spontanous Breathing  Post-op Pain: 3 /10, mild  Post-op Assessment: Post-op Vital signs reviewed, Patient's Cardiovascular Status Stable, Respiratory Function Stable, Patent Airway, No signs of Nausea or vomiting and Pain level controlled  Post-op Vital Signs: Reviewed and stable  Last Vitals:  Filed Vitals:   01/03/14 1100  BP: 113/71  Pulse: 80  Temp: 36.7 C  Resp: 14    Complications: No apparent anesthesia complications

## 2014-01-03 NOTE — Anesthesia Preprocedure Evaluation (Addendum)
Anesthesia Evaluation  Patient identified by MRN, date of birth, ID band Patient awake    Reviewed: Allergy & Precautions, H&P , NPO status   Airway Mallampati: I TM Distance: >3 FB Neck ROM: Full    Dental  (+) Teeth Intact   Pulmonary Current Smoker (am cough, raspy voice),  breath sounds clear to auscultation        Cardiovascular negative cardio ROS  Rhythm:Regular Rate:Normal     Neuro/Psych    GI/Hepatic negative GI ROS,   Endo/Other    Renal/GU      Musculoskeletal   Abdominal   Peds  Hematology   Anesthesia Other Findings   Reproductive/Obstetrics                         Anesthesia Physical Anesthesia Plan  ASA: II  Anesthesia Plan: General   Post-op Pain Management:    Induction: Intravenous  Airway Management Planned: Oral ETT  Additional Equipment:   Intra-op Plan:   Post-operative Plan: Extubation in OR  Informed Consent: I have reviewed the patients History and Physical, chart, labs and discussed the procedure including the risks, benefits and alternatives for the proposed anesthesia with the patient or authorized representative who has indicated his/her understanding and acceptance.     Plan Discussed with:   Anesthesia Plan Comments:        Anesthesia Quick Evaluation

## 2014-01-03 NOTE — Op Note (Signed)
Patient:  Sophia ReaperCristy L Barberi  DOB:  January 22, 1986  MRN:  [id number]   Preop Diagnosis:  Biliary colic, cholelithiasis  Postop Diagnosis:  Same  Procedure:  Laparoscopic cholecystectomy  Surgeon:  Franky MachoMark Iliyah Bui, M.D.  Anes:  General endotracheal  Indications:  Patient is a 28 year old white female presents with biliary colic secondary to cholelithiasis. The risks and benefits of the procedure including bleeding, infection, hepatobiliary injury, the possibility of an open procedure were fully explained to the patient, who gave informed consent.  Procedure note:  The patient is placed the supine position. After induction of general endotracheal anesthesia, the abdomen was prepped and draped using usual sterile technique with DuraPrep. Surgical site confirmation was performed.  An infraumbilical incision was made down to the fascia. A Veress needle was introduced into the abdominal cavity and confirmation of placement was done using the saline drop test. The abdomen was then insufflated to 16 mm mercury pressure. An 11 mm trocar was introduced into the abdominal cavity under direct visualization without difficulty. Patient was placed in reverse Trendelenburg position and additional 11 mm trocar was placed the epigastric region and 5 mm trochars were placed the right upper quadrant and right flank regions. Liver was inspected and noted to be within normal limits. The gallbladder was retracted in a dynamic fashion in order to expose the triangle of Calot. The cystic duct was first identified. Its junction to the infundibulum was fully identified. Endoclips placed proximally distally on the cystic duct, and the cystic duct was divided. This was likewise done to the cystic artery. The gallbladder was then freed away from the gallbladder fossa using Bovie electrocautery. The gallbladder was delivered through the epigastric trocar site using an Endo Catch bag. The gallbladder fossa was inspected and no abnormal  bleeding or bile leakage was noted. Surgicel is placed the gallbladder fossa. All fluid and air were then evacuated from the abdominal cavity prior to removal of the trochars.  All wounds were irrigated with normal saline. All wounds were injected with 0.5% Sensorcaine. The infraumbilical fascia as well as epigastric fascia were reapproximated using 0 Vicryl interrupted sutures. All skin incisions were closed using staples. Betadine ointment and dry sterile dressings were applied.  All tape and needle counts were correct at the end of the procedure. Patient was extubated in the operating room and transferred to PACU in stable condition.  Complications:  None  EBL:  Minimal  Specimen:  Gallbladder

## 2014-01-03 NOTE — Transfer of Care (Signed)
Immediate Anesthesia Transfer of Care Note  Patient: Sophia Garcia  Procedure(s) Performed: Procedure(s): LAPAROSCOPIC CHOLECYSTECTOMY (N/A)  Patient Location: PACU  Anesthesia Type:General  Level of Consciousness: awake and patient cooperative  Airway & Oxygen Therapy: Patient Spontanous Breathing and Patient connected to face mask oxygen  Post-op Assessment: Report given to PACU RN, Post -op Vital signs reviewed and stable and Patient moving all extremities  Post vital signs: Reviewed and stable  Complications: No apparent anesthesia complications

## 2014-01-04 ENCOUNTER — Encounter (HOSPITAL_COMMUNITY): Payer: Self-pay | Admitting: General Surgery

## 2014-04-03 ENCOUNTER — Emergency Department (HOSPITAL_COMMUNITY): Payer: Self-pay

## 2014-04-03 ENCOUNTER — Emergency Department (HOSPITAL_COMMUNITY)
Admission: EM | Admit: 2014-04-03 | Discharge: 2014-04-03 | Disposition: A | Discharge status: Home / Self Care | Payer: Self-pay | Attending: Emergency Medicine | Admitting: Emergency Medicine

## 2014-04-03 ENCOUNTER — Encounter (HOSPITAL_COMMUNITY): Payer: Self-pay | Admitting: Emergency Medicine

## 2014-04-03 DIAGNOSIS — IMO0002 Reserved for concepts with insufficient information to code with codable children: Secondary | ICD-10-CM | POA: Insufficient documentation

## 2014-04-03 DIAGNOSIS — J029 Acute pharyngitis, unspecified: Secondary | ICD-10-CM | POA: Insufficient documentation

## 2014-04-03 DIAGNOSIS — Z8619 Personal history of other infectious and parasitic diseases: Secondary | ICD-10-CM | POA: Insufficient documentation

## 2014-04-03 DIAGNOSIS — Z91040 Latex allergy status: Secondary | ICD-10-CM | POA: Insufficient documentation

## 2014-04-03 DIAGNOSIS — F17200 Nicotine dependence, unspecified, uncomplicated: Secondary | ICD-10-CM | POA: Insufficient documentation

## 2014-04-03 DIAGNOSIS — Z720 Tobacco use: Secondary | ICD-10-CM

## 2014-04-03 DIAGNOSIS — J209 Acute bronchitis, unspecified: Secondary | ICD-10-CM | POA: Insufficient documentation

## 2014-04-03 DIAGNOSIS — Z8742 Personal history of other diseases of the female genital tract: Secondary | ICD-10-CM | POA: Insufficient documentation

## 2014-04-03 MED ORDER — PREDNISONE 20 MG PO TABS: 40.0000 mg | ORAL_TABLET | Freq: Every day | ORAL | Status: DC

## 2014-04-03 MED ORDER — HYDROCODONE-ACETAMINOPHEN 7.5-325 MG/15ML PO SOLN: 15.0000 mL | Freq: Four times a day (QID) | ORAL | Status: DC | PRN

## 2014-04-03 MED ORDER — PREDNISONE 50 MG PO TABS
60.0000 mg | ORAL_TABLET | Freq: Once | ORAL | Status: AC
  Administered 2014-04-03: 60 mg via ORAL
  Filled 2014-04-03 (×2): qty 1

## 2014-04-03 MED ORDER — ALBUTEROL SULFATE HFA 108 (90 BASE) MCG/ACT IN AERS
2.0000 | INHALATION_SPRAY | Freq: Once | RESPIRATORY_TRACT | Status: AC
  Administered 2014-04-03: 2 via RESPIRATORY_TRACT
  Filled 2014-04-03: qty 6.7

## 2014-04-03 MED ORDER — LIDOCAINE VISCOUS 2 % MT SOLN
20.0000 mL | Freq: Once | OROMUCOSAL | Status: AC
  Administered 2014-04-03: 20 mL via OROMUCOSAL
  Filled 2014-04-03: qty 30

## 2014-04-03 NOTE — Discharge Instructions (Signed)
Take Hycet  for cough and pain. Do not drink alcohol, drive, care for children or do other critical tasks while taking Hycet.   Do not hesitate to return to the emergency room for any new, worsening or concerning symptoms.  Please obtain primary care using resource guide below. But the minute you were seen in the emergency room and that they will need to obtain records for further outpatient management.   Acute Bronchitis Bronchitis is when the airways that extend from the windpipe into the lungs get red, puffy, and painful (inflamed). Bronchitis often causes thick spit (mucus) to develop. This leads to a cough. A cough is the most common symptom of bronchitis. In acute bronchitis, the condition usually begins suddenly and goes away over time (usually in 2 weeks). Smoking, allergies, and asthma can make bronchitis worse. Repeated episodes of bronchitis may cause more lung problems. HOME CARE  Rest.  Drink enough fluids to keep your pee (urine) clear or pale yellow (unless you need to limit fluids as told by your doctor).  Only take over-the-counter or prescription medicines as told by your doctor.  Avoid smoking and secondhand smoke. These can make bronchitis worse. If you are a smoker, think about using nicotine gum or skin patches. Quitting smoking will help your lungs heal faster.  Reduce the chance of getting bronchitis again by:  Washing your hands often.  Avoiding people with cold symptoms.  Trying not to touch your hands to your mouth, nose, or eyes.  Follow up with your doctor as told. GET HELP IF: Your symptoms do not improve after 1 week of treatment. Symptoms include:  Cough.  Fever.  Coughing up thick spit.  Body aches.  Chest congestion.  Chills.  Shortness of breath.  Sore throat. GET HELP RIGHT AWAY IF:   You have an increased fever.  You have chills.  You have severe shortness of breath.  You have bloody thick spit (sputum).  You throw up  (vomit) often.  You lose too much body fluid (dehydration).  You have a severe headache.  You faint. MAKE SURE YOU:   Understand these instructions.  Will watch your condition.  Will get help right away if you are not doing well or get worse. Document Released: 01/22/2008 Document Revised: 04/07/2013 Document Reviewed: 01/26/2013 [facility] Patient Information 2015 [place], [place]. This information is not intended to replace advice given to you by your health care provider. Make sure you discuss any questions you have with your health care provider.   Emergency Department Resource Guide 1) Find a Doctor and Pay Out of Pocket Although you won't have to find out who is covered by your insurance plan, it is a good idea to ask around and get recommendations. You will then need to call the office and see if the doctor you have chosen will accept you as a new patient and what types of options they offer for patients who are self-pay. Some doctors offer discounts or will set up payment plans for their patients who do not have insurance, but you will need to ask so you aren't surprised when you get to your appointment.  2) Contact Your Local Health Department Not all health departments have doctors that can see patients for sick visits, but many do, so it is worth a call to see if yours does. If you don't know where your local health department is, you can check in your phone book. The CDC also has a tool to help you locate your  state's health department, and many state websites also have listings of all of their local health departments.  3) Find a Walk-in Clinic If your illness is not likely to be very severe or complicated, you may want to try a walk in clinic. These are popping up all over the country in pharmacies, drugstores, and shopping centers. They're usually staffed by nurse practitioners or physician assistants that have been trained to treat common illnesses and complaints. They're  usually fairly quick and inexpensive. However, if you have serious medical issues or chronic medical problems, these are probably not your best option.  No Primary Care Doctor: - Call Health Connect at  [phone] - they can help you locate a primary care doctor that  accepts your insurance, provides certain services, etc. - Physician Referral Service- [phone]  Chronic Pain Problems: Organization         Address  Phone   Notes  [facility]  [phone] Patients need to be referred by their primary care doctor.   Medication Assistance: Organization         Address  Phone   Notes  [facility] Medication [facility] [address] [place], [place] [address] [phone] --Must be a resident of [facility] -- Must have NO insurance coverage whatsoever (no Medicaid/ Medicare, etc.) -- The pt. MUST have a primary care doctor that directs their care regularly and follows them in the community   [facility]ist  [phone]   [employer]  [phone]    Agencies that provide inexpensive medical care: Organization         Address  Phone   Notes  [facility]  [phone]   [facility]    [phone]   [facility] [address] [place], [place] [address] [phone]   [facility] [address] [place] [place] [phone]   Planned Parenthood    [phone]   [facility]    [phone]   [facility]  [address], [place] Phone:  [phone], Fax:  [phone] Hours of Operation:  9 am - 6 pm, M-F.  Also accepts Medicaid/Medicare and self-pay.  [facility]  [address], [place] Phone: [phone], Fax: [phone]. Hours of Operation:  8:30 am - 5:30 pm, M-F.  Also accepts Medicaid and self-pay.  [facility] [place] [address] [place] Phone:  [phone]   [facility] [address] Natasha Bence [place], [place] [phone], Ext. [phone] Mondays & Thursdays: 7-9 AM.  First 15 patients are seen on a first come, first serve basis.    Medicaid-accepting [facility] Providers:  Organization         Address  Phone   Notes  [facility] [address],  [phone] Also accepts self-pay patients.  [facility] [address] Laurell Josephs [place], [place]  [phone]   [facility] [address], [place] [phone]   [facility] [address], [place] [phone]   Renaye Rakers [address], [place]   [phone] Only accepts [place] Access [place] patients after they have their name applied to their card.   Self-Pay (no insurance) in [facility]:  Organization  Address  Phone   Notes  Sickle Cell Patients, [facility] [address], [place] [phone]) [phone]   [facility] [address], [place] [phone]   [facility]  [address],  [phone]   [facility]/Dr. Osei-Bonsu  [address] [place] [address], [place] or [phone], [place] [phone]) [phone] Phone number for both [place] and [place] locations is the same.  [facility] [address], [place] [phone]   [facility] [address] [place] [address], [place] or [address] [phone]) [phone]   [facility] [address], [place] [phone]) [phone], phone; [phone]) [phone], fax Sees patients 1st and 3rd Saturday of every month.  Must not qualify for public or private insurance (i.e. Medicaid, Medicare, [place] Health Choice, Veterans' Benefits)  Household income should be no more than 200% of the poverty level The clinic cannot treat  you if you are pregnant or think you are pregnant  Sexually transmitted diseases are not treated at the clinic.    Dental Care: Organization         Address  Phone  Notes  [facility] [address], [place] [phone] Accepts children up to age 29 who are enrolled in [place]icaid or [place] Health Choice; pregnant women with a Medicaid card; and children who have applied for Medicaid or [place] Health Choice, but were declined, whose parents can pay a reduced fee at time of service.  [facility] [place]  [address], [place] [phone] Accepts children up to age 29 who are enrolled in [place]icaid or [place] Health Choice; pregnant women with a Medicaid card; and children who have applied for Medicaid or [place] Health Choice, but were declined, whose parents can pay a reduced fee at time of service.  [facility]  [address], [place] [phone]) [phone] Patients are seen by appointment only. Walk-ins are not accepted. [facility] will see patients 29 years of age and older. Monday - Tuesday (8am-5pm) Most Wednesdays (8:30-5pm) $30 per visit, cash only  [facility]  [address], [facility] [phone]) [phone] Patients are seen by appointment only. Walk-ins are not accepted. [facility] will see patients 29 years of age and older. One Wednesday Evening (Monthly: Volunteer Based).  $30 per visit, cash only  [employer] [facility] [facility]  [phone] for adults; Children under age 324, call Graduate Pediatric Dentistry at [phone]. Children aged 894-14, please call [phone] to request a pediatric application.  Dental services are provided in all areas of dental care including fillings, crowns and bridges, complete and partial dentures, implants, gum treatment, root canals, and extractions. Preventive care is also provided. Treatment  is provided to both adults and children. Patients are selected via a lottery and there is often a waiting list.   [facility] [address], [place]  [phone]) [phone] [email or web address]   [facility] [address], [place], [place] [phone], Ext. [phone] Second and Fourth Thursday of each month, opens at 6:30 AM; Clinic ends at 9 AM.  Patients are seen on a first-come first-served basis, and a limited number are seen during each clinic.   [facility]  [address], [place], [place] [phone]   Eligibility Requirements You must have lived in [place], [place], or [place] counties for at least the last three months.   You cannot be eligible for state or federal sponsored National Cityhealthcare insurance, including CIGNAVeterans Administration, [place]icaid, or [employer] EntertainmentMedicare.   You generally cannot be eligible for healthcare insurance through your employer.    How to apply: Eligibility screenings are held every Tuesday and Wednesday afternoon from 1:00 pm until 4:00 pm. You do not need an appointment for the interview!  [facility] [address], [place], [place] [phone]   [facility]  [phone]   [facility]  [phone]   [facility]  [phone]    Behavioral Health Resources in the Community: Intensive Outpatient Programs Organization         Address  Phone  Notes  [facility] [address], [place], [place] [phone]   [facility] [address], [place], [place] [phone]   [facility] [address], [place], [place]  [phone]   [facility] [address],  [place], [place] [phone] or [phone]   Substance Abuse Resources Organization         Address  Phone  Notes  [facility]  [phone]   [facility]  [phone]   [facility]  [phone]   [facility]  [phone]   Residential & Outpatient Substance Abuse Program  [phone]   Psychological Services Organization         Address  Phone  Notes  [facility]  [phone]- [phone]   [facility]  [phone]- [phone]   [facility] [address], [place] [phone] or [phone]    Mobile Crisis Teams Organization         Address  Phone  Notes  [facility], Mobile Crisis Care Unit  [phone]   [facility]  [address] [place], [place] [phone]   Doristine LocksSharon DeEsch [address] [place] [phone]    Self-Help/Support Groups Organization         Address  Phone             Notes  [facility] of [place] - variety of support groups  [phone] Call for more information  Narcotics Anonymous (NA), [facility]aring [facility] [address], [place]  2 meetings at this location   Statisticianesidential Treatment Programs Organization         Address  Phone  Notes  [facility] [address],    [place]  [phone]   [facility]  [address], [place] [phone], [place], [place] [phone]   [facility] [address], [place] [phone] Admissions: 8am-3pm M-F  [facility] [address]    [place], [place] [phone]   [facility] [address], [place], [place] [phone]   [facility] [address]  [place], [place] [phone]   [facility] - Intensive Outpatient [address], [place], [place] [phone]   [facility] ([facility] [address] [place] [address]  [place], [place] [phone] or [phone]   [facility] ([facility]) [address] [place], [place] [phone] Accepts Medicaid  [facility] [address]  [place] [phone] Substance Abuse/Addiction Treatment     [facility] Resources  Organization         Address  Phone  Notes  [facility]  [phone]   Domenic Schwab, PhD [address] Arlis Porta [place], [place]   [phone] or [phone]   [place], [place] [phone]   [address], [place], [place] [phone] Insurance/Medicaid/sponsorship through [facility] and Families [address]                                    [place], [place] [phone] [place] [address], [place] [phone]    Dr. Adele Schilder  [phone]   [facility] Dept. 1) [address] [place] 2) [place] 3)  [address], [place] [phone]  [phone]   [place] [phone] or [phone] (After Hours)

## 2014-04-03 NOTE — ED Provider Notes (Signed)
CSN: [id number]     Arrival date & time 04/03/14  1622 History  This chart was scribed for non-physician practitioner, Sophia EmeryNicole Claudio Mondry, PA-C,working with Sophia JesterKathleen McManus, DO, by Sophia Garcia, ED Scribe. This patient was seen in room APFT24/APFT24 and the patient's care was started at 4:47 PM.  Chief Complaint  Patient presents with  . Sore Throat   Patient is a 28 y.o. female presenting with pharyngitis. The history is provided by the patient. No language interpreter was used.  Sore Throat   HPI Comments:  Sophia Garcia is a 28 y.o. female who presents to the Emergency Department complaining of moderate right-sided throat pain that started two days ago. Pt reports pain with swallowing, sneezing, chills,  mild nausea, intermittent productive cough and rhinorrhea onset three days ago. She states she has not been eating and drinking normally because she has not felt like it. She reports using throat spray with no relief. She denies fever, vomiting, and diarrhea.   Past Medical History  Diagnosis Date  . Yeast infection   . BV (bacterial vaginosis)   . Normal pregnancy, incidental 12/23/2011  . Preterm labor   . HPV (human papilloma virus) infection    Past Surgical History  Procedure Laterality Date  . Cesarean section    . Tooth extraction  2009    ONE TOOTH  . Foot surgery  2002    STEPPED ON SEWING NEEDLE  . Cesarean section  06/12/2012    Procedure: CESAREAN SECTION;  Surgeon: Kirkland HunArthur Stringer, MD;  Location: [facility];  Service: Obstetrics;  Laterality: N/A;  Repeat  . Tubal ligation    . Cholecystectomy N/A 01/03/2014    Procedure: LAPAROSCOPIC CHOLECYSTECTOMY;  Surgeon: Dalia HeadingMark A Jenkins, MD;  Location: [facility];  Service: General;  Laterality: N/A;   Family History  Problem Relation Age of Onset  . Diabetes Father     ORAL MEDS  . Cancer Paternal Aunt     BREAST;LIVER  . Diabetes Paternal Aunt   . Diabetes Paternal Uncle   . COPD Paternal Uncle   . Cancer Maternal  Grandmother     BREAST  . COPD Mother     EMPHYSEMA  . Diabetes Paternal Grandfather   . Other Neg Hx    History  Substance Use Topics  . Smoking status: Current Every Day Smoker -- 0.25 packs/day for 12 years    Types: Cigarettes  . Smokeless tobacco: Never Used     Comment: 1 pack lasts 4 days  . Alcohol Use: No   OB History   Grav Para Term Preterm Abortions TAB SAB Ect Mult Living   2 2 2       2      Review of Systems A complete 10 system review of systems was obtained and all systems are negative except as noted in the HPI and PMH.   Allergies  Latex; Baby oil; and Morphine and related  Home Medications   Prior to Admission medications   Medication Sig Start Date End Date Taking? Authorizing Provider  ibuprofen (ADVIL,MOTRIN) 200 MG tablet Take 400 mg by mouth 2 (two) times daily as needed for moderate pain.   Yes Historical Provider, MD  HYDROcodone-acetaminophen (HYCET) 7.5-325 mg/15 ml solution Take 15 mLs by mouth every 6 (six) hours as needed for moderate pain (and cough). 04/03/14   Sophia Bognar, PA-C  predniSONE (DELTASONE) 20 MG tablet Take 2 tablets (40 mg total) by mouth daily. 04/03/14   Sophia EmeryNicole Vung Kush, PA-C   Triage  Vitals: BP 122/69  Pulse 110  Temp(Src) 98.3 F (36.8 C) (Oral)  Resp 18  Ht 4\' 11"  (1.499 m)  Wt 94 lb (42.638 kg)  BMI 18.98 kg/m2  SpO2 100%  LMP 03/11/2014 Physical Exam  Nursing note and vitals reviewed. Constitutional: She is oriented to person, place, and time. She appears well-developed and well-nourished.  HENT:  Head: Normocephalic and atraumatic.  Mouth/Throat: Oropharynx is clear and moist.  No tonsillar hypertrophy. Posterior pharynx is injected  Eyes: Conjunctivae and EOM are normal. Pupils are equal, round, and reactive to light.  Neck: Normal range of motion. Neck supple.  Cardiovascular: Normal rate, regular rhythm and intact distal pulses.   Pulmonary/Chest: Effort normal and breath sounds normal. No respiratory  distress. She has no wheezes. She has no rales. She exhibits no tenderness.  Abdominal: Soft. There is no tenderness.  Musculoskeletal: Normal range of motion.  Lymphadenopathy:    She has no cervical adenopathy.  Neurological: She is alert and oriented to person, place, and time.  Skin: Skin is warm and dry.  Psychiatric: She has a normal mood and affect. Her behavior is normal.    ED Course  Procedures (including critical care time) DIAGNOSTIC STUDIES: Oxygen Saturation is 100% on RA, normal by my interpretation.   COORDINATION OF CARE: 4:57 PM- Will order CXR. Will give medication for pain. Pt verbalizes understanding and agrees to plan.  Medications  lidocaine (XYLOCAINE) 2 % viscous mouth solution 20 mL (20 mLs Mouth/Throat Given 04/03/14 1703)  albuterol (PROVENTIL HFA;VENTOLIN HFA) 108 (90 BASE) MCG/ACT inhaler 2 puff (2 puffs Inhalation Given 04/03/14 1752)  predniSONE (DELTASONE) tablet 60 mg (60 mg Oral Given 04/03/14 1749)    Labs Review Labs Reviewed - No data to display  Imaging Review Dg Chest 2 View  04/03/2014   CLINICAL DATA:  Cough.  Sore throat.  EXAM: CHEST  2 VIEW  COMPARISON:  03/22/2009  FINDINGS: Heart size and pulmonary vascularity are normal. There is peribronchial thickening consistent with bronchitis. No consolidative infiltrates or effusions. No osseous abnormality.  IMPRESSION: Bronchitic changes.   Electronically Signed   By: Geanie Cooley M.D.   On: 04/03/2014 17:24     EKG Interpretation None      MDM   Final diagnoses:  Acute bronchitis, unspecified organism  Tobacco use    Filed Vitals:   04/03/14 1633 04/03/14 1704 04/03/14 1753  BP: 122/69 119/64   Pulse: 110 89   Temp: 98.3 F (36.8 C)    TempSrc: Oral    Resp: 18 20   Height: 4\' 11"  (1.499 m)    Weight: 94 lb (42.638 kg)    SpO2: 100% 100% 100%    Medications  lidocaine (XYLOCAINE) 2 % viscous mouth solution 20 mL (20 mLs Mouth/Throat Given 04/03/14 1703)  albuterol  (PROVENTIL HFA;VENTOLIN HFA) 108 (90 BASE) MCG/ACT inhaler 2 puff (2 puffs Inhalation Given 04/03/14 1752)  predniSONE (DELTASONE) tablet 60 mg (60 mg Oral Given 04/03/14 1749)    Sophia Garcia is a 28 y.o. female presenting with 3 days of nausea, and reproduction cough, sore throat and suture fever. Was initially tachycardic and resolved on recheck without mention chest is no infiltrate. In for acute bronchitis is, hycet and albuterol. Extensive counseling on tobacco cessation.   Evaluation does not show pathology that would require ongoing emergent intervention or inpatient treatment. Pt is hemodynamically stable and mentating appropriately. Discussed findings and plan with patient/guardian, who agrees with care plan. All questions answered. Return precautions  discussed and outpatient follow up given.   New Prescriptions   HYDROCODONE-ACETAMINOPHEN (HYCET) 7.5-325 MG/15 ML SOLUTION    Take 15 mLs by mouth every 6 (six) hours as needed for moderate pain (and cough).   PREDNISONE (DELTASONE) 20 MG TABLET    Take 2 tablets (40 mg total) by mouth daily.    I personally performed the services described in this documentation, which was scribed in my presence. The recorded information has been reviewed and is accurate.    Sophia Emery, PA-C 04/03/14 [phone]

## 2014-04-03 NOTE — ED Notes (Signed)
C/o sore throat since Thursday, nausea today, denies diarrhea, vomiting or fever; + coughing and sneezing

## 2014-04-04 NOTE — ED Provider Notes (Signed)
Medical screening examination/treatment/procedure(s) were performed by non-physician practitioner and as supervising physician I was immediately available for consultation/collaboration.   EKG Interpretation None        Samuel JesterKathleen Ryah Cribb, DO 04/04/14 1549

## 2014-04-10 ENCOUNTER — Emergency Department (HOSPITAL_COMMUNITY)
Admission: EM | Admit: 2014-04-10 | Discharge: 2014-04-10 | Disposition: A | Discharge status: Home / Self Care | Payer: Self-pay | Attending: Emergency Medicine | Admitting: Emergency Medicine

## 2014-04-10 ENCOUNTER — Encounter (HOSPITAL_COMMUNITY): Payer: Self-pay | Admitting: Emergency Medicine

## 2014-04-10 DIAGNOSIS — Z8742 Personal history of other diseases of the female genital tract: Secondary | ICD-10-CM | POA: Insufficient documentation

## 2014-04-10 DIAGNOSIS — Z791 Long term (current) use of non-steroidal anti-inflammatories (NSAID): Secondary | ICD-10-CM | POA: Insufficient documentation

## 2014-04-10 DIAGNOSIS — H9209 Otalgia, unspecified ear: Secondary | ICD-10-CM | POA: Insufficient documentation

## 2014-04-10 DIAGNOSIS — Z91040 Latex allergy status: Secondary | ICD-10-CM | POA: Insufficient documentation

## 2014-04-10 DIAGNOSIS — IMO0002 Reserved for concepts with insufficient information to code with codable children: Secondary | ICD-10-CM | POA: Insufficient documentation

## 2014-04-10 DIAGNOSIS — Z8619 Personal history of other infectious and parasitic diseases: Secondary | ICD-10-CM | POA: Insufficient documentation

## 2014-04-10 DIAGNOSIS — J309 Allergic rhinitis, unspecified: Secondary | ICD-10-CM | POA: Insufficient documentation

## 2014-04-10 DIAGNOSIS — F17200 Nicotine dependence, unspecified, uncomplicated: Secondary | ICD-10-CM | POA: Insufficient documentation

## 2014-04-10 DIAGNOSIS — J029 Acute pharyngitis, unspecified: Secondary | ICD-10-CM | POA: Insufficient documentation

## 2014-04-10 LAB — RAPID STREP SCREEN (MED CTR MEBANE ONLY): Streptococcus, Group A Screen (Direct): NEGATIVE

## 2014-04-10 MED ORDER — IBUPROFEN 600 MG PO TABS: 600.0000 mg | ORAL_TABLET | Freq: Four times a day (QID) | ORAL | Status: DC | PRN

## 2014-04-10 MED ORDER — MOMETASONE FUROATE 50 MCG/ACT NA SUSP: 2.0000 | Freq: Every day | NASAL | Status: DC

## 2014-04-10 MED ORDER — OXYMETAZOLINE HCL 0.05 % NA SOLN: 1.0000 | Freq: Two times a day (BID) | NASAL | Status: DC

## 2014-04-10 MED ORDER — LORATADINE 10 MG PO TABS: 10.0000 mg | ORAL_TABLET | Freq: Every day | ORAL | Status: DC

## 2014-04-10 MED ORDER — LORATADINE 10 MG PO TABS
10.0000 mg | ORAL_TABLET | Freq: Once | ORAL | Status: AC
  Administered 2014-04-10: 10 mg via ORAL
  Filled 2014-04-10: qty 1

## 2014-04-10 MED ORDER — IBUPROFEN 400 MG PO TABS
600.0000 mg | ORAL_TABLET | Freq: Once | ORAL | Status: AC
  Administered 2014-04-10: 600 mg via ORAL
  Filled 2014-04-10: qty 2

## 2014-04-10 MED ORDER — OXYMETAZOLINE HCL 0.05 % NA SOLN
1.0000 | Freq: Once | NASAL | Status: AC
  Administered 2014-04-10: 1 via NASAL
  Filled 2014-04-10: qty 15

## 2014-04-10 NOTE — Discharge Instructions (Signed)

## 2014-04-10 NOTE — ED Provider Notes (Signed)
CSN: [id number]     Arrival date & time 04/10/14  0348 History   First MD Initiated Contact with Patient 04/10/14 [phone]     Chief Complaint  Patient presents with  . Sore Throat     (Consider location/radiation/quality/duration/timing/severity/associated sxs/prior Treatment) HPI Patient presents with one day of left-sided sore throat, nasal congestion and ear pressure. She denies fever or chills. She was seen last week for similar symptoms with a negative strep test. She denies any new exposures. She's had no cough or shortness of breath. Past Medical History  Diagnosis Date  . Yeast infection   . BV (bacterial vaginosis)   . Normal pregnancy, incidental 12/23/2011  . Preterm labor   . HPV (human papilloma virus) infection    Past Surgical History  Procedure Laterality Date  . Cesarean section    . Tooth extraction  2009    ONE TOOTH  . Foot surgery  2002    STEPPED ON SEWING NEEDLE  . Cesarean section  06/12/2012    Procedure: CESAREAN SECTION;  Surgeon: Kirkland Hun, MD;  Location: [facility];  Service: Obstetrics;  Laterality: N/A;  Repeat  . Tubal ligation    . Cholecystectomy N/A 01/03/2014    Procedure: LAPAROSCOPIC CHOLECYSTECTOMY;  Surgeon: Dalia Heading, MD;  Location: [facility];  Service: General;  Laterality: N/A;   Family History  Problem Relation Age of Onset  . Diabetes Father     ORAL MEDS  . Cancer Paternal Aunt     BREAST;LIVER  . Diabetes Paternal Aunt   . Diabetes Paternal Uncle   . COPD Paternal Uncle   . Cancer Maternal Grandmother     BREAST  . COPD Mother     EMPHYSEMA  . Diabetes Paternal Grandfather   . Other Neg Hx    History  Substance Use Topics  . Smoking status: Current Every Day Smoker -- 0.25 packs/day for 12 years    Types: Cigarettes  . Smokeless tobacco: Never Used     Comment: 1 pack lasts 4 days  . Alcohol Use: No   OB History   Grav Para Term Preterm Abortions TAB SAB Ect Mult Living   Review of Systems   Constitutional: Negative for fever and chills.  HENT: Positive for congestion, ear pain, rhinorrhea and sore throat. Negative for trouble swallowing.   Eyes: Negative for photophobia and visual disturbance.  Respiratory: Negative for chest tightness, shortness of breath and wheezing.   Cardiovascular: Negative for chest pain, palpitations and leg swelling.  Gastrointestinal: Negative for nausea, vomiting, abdominal pain and diarrhea.  Musculoskeletal: Negative for back pain, myalgias, neck pain and neck stiffness.  Skin: Negative for rash and wound.  Neurological: Negative for dizziness, weakness, light-headedness, numbness and headaches.  All other systems reviewed and are negative.     Allergies  Latex; Baby oil; and Morphine and related  Home Medications   Prior to Admission medications   Medication Sig Start Date End Date Taking? Authorizing Provider  HYDROcodone-acetaminophen (HYCET) 7.5-325 mg/15 ml solution Take 15 mLs by mouth every 6 (six) hours as needed for moderate pain (and cough). 04/03/14   Nicole Pisciotta, PA-C  ibuprofen (ADVIL,MOTRIN) 200 MG tablet Take 400 mg by mouth 2 (two) times daily as needed for moderate pain.    Historical Provider, MD  predniSONE (DELTASONE) 20 MG tablet Take 2 tablets (40 mg total) by mouth daily. 04/03/14   Wynetta Emery, PA-C  BP 126/83  Pulse 94  Temp(Src) 97.9 F (36.6 C) (Oral)  Resp 16  Ht  (1.499 m)  Wt 94 lb (42.638 kg)  BMI 18.98 kg/m2  SpO2 99%  LMP 04/10/2014 Physical Exam  Nursing note and vitals reviewed. Constitutional: She is oriented to person, place, and time. She appears well-developed and well-nourished. No distress.  HENT:  Head: Normocephalic and atraumatic.  Mouth/Throat: Oropharynx is clear and moist. No oropharyngeal exudate.  Nasal mucosal edema left greater than right. Mildly erythematous oropharynx. No exudates. Left TM is slightly bulging. No erythema. No sinus tenderness to percussion.   Eyes: EOM are normal. Pupils are equal, round, and reactive to light.  Neck: Normal range of motion. Neck supple.  Cardiovascular: Normal rate and regular rhythm.  Exam reveals no gallop and no friction rub.   No murmur heard. Pulmonary/Chest: Effort normal and breath sounds normal. No respiratory distress. She has no wheezes. She has no rales. She exhibits no tenderness.  Abdominal: Soft. Bowel sounds are normal. She exhibits no distension and no mass. There is no tenderness. There is no rebound and no guarding.  Musculoskeletal: Normal range of motion. She exhibits no edema and no tenderness.  Lymphadenopathy:    She has no cervical adenopathy.  Neurological: She is alert and oriented to person, place, and time.  Skin: Skin is warm and dry. No rash noted. No erythema.  Psychiatric: She has a normal mood and affect. Her behavior is normal.    ED Course  Procedures (including critical care time) Labs Review Labs Reviewed  RAPID STREP SCREEN  CULTURE, GROUP A STREP    Imaging Review No results found.   EKG Interpretation None      MDM   Final diagnoses:  None    Symptoms are likely viral versus allergic. We'll treat symptomatically and started on Claritin.    Loren Racer, MD 04/10/14 [phone]

## 2014-04-10 NOTE — ED Notes (Addendum)
Patient complaining of sore throat, mostly to left side. Also reports left ear pain and irritation. Patient reports difficulty swallowing.

## 2014-04-12 LAB — CULTURE, GROUP A STREP

## 2014-06-20 ENCOUNTER — Encounter (HOSPITAL_COMMUNITY): Payer: Self-pay | Admitting: Emergency Medicine

## 2014-06-25 IMAGING — US US OB COMP +14 WK
1 series · 12 of 28 positions shown · non-contrast
Comparison: none

[Series 1: us ob comp +14 wk · 64 acquisitions, 12 frames shown]
[im 3/64]
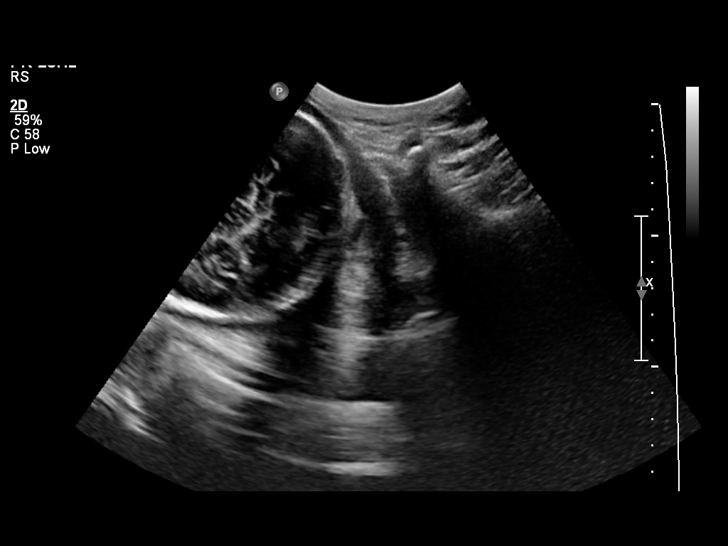
[im 8/64]
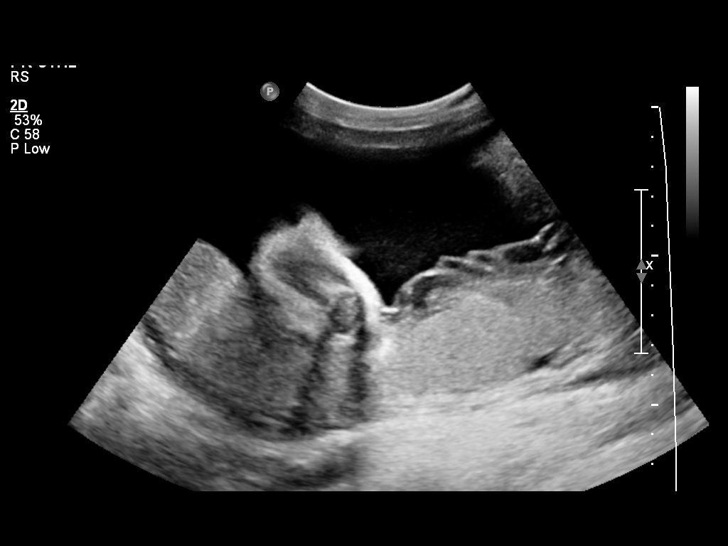
[im 12/64]
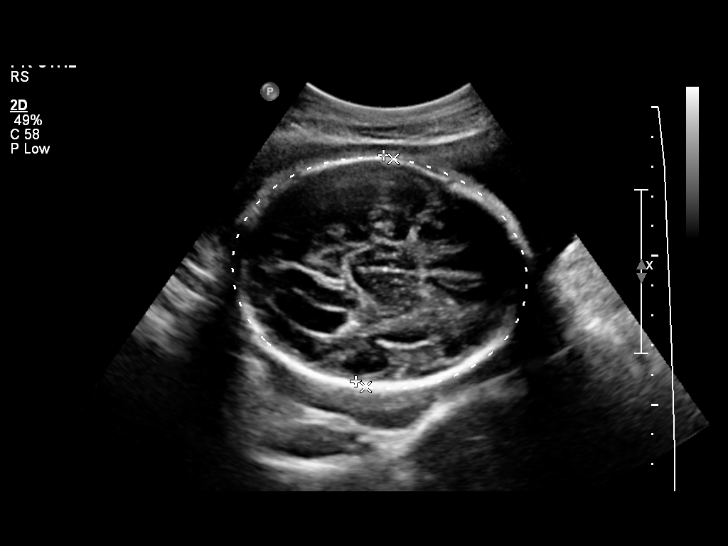
[im 19/64]
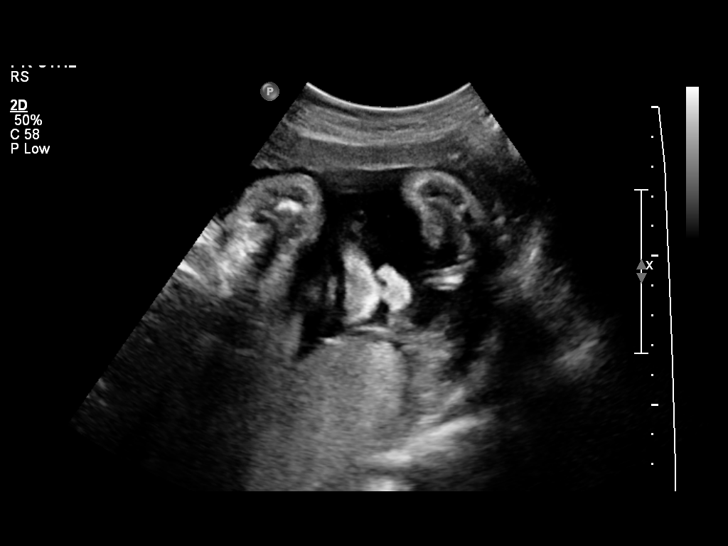
[im 24/64]
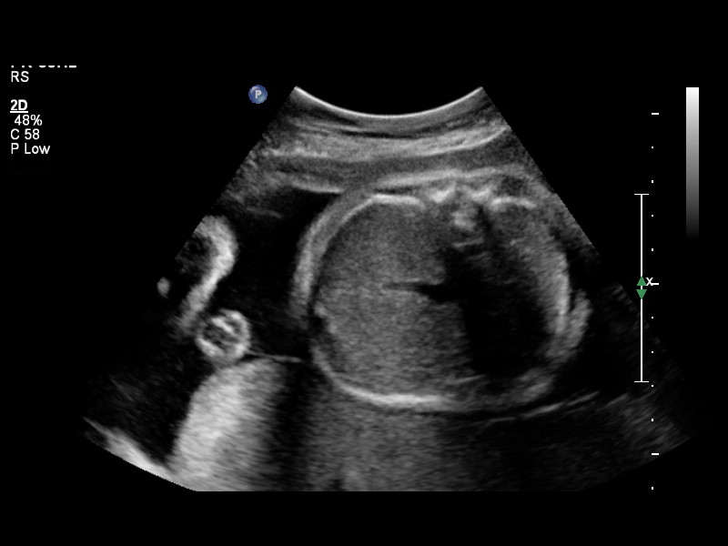
[im 29/64]
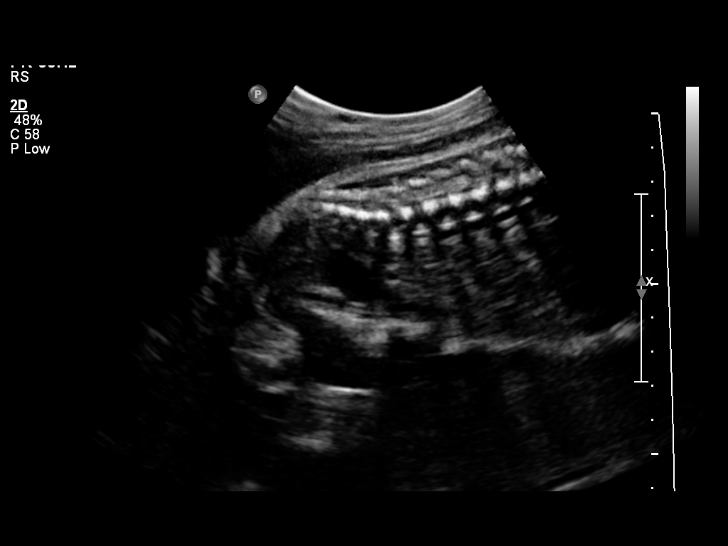
[im 36/64]
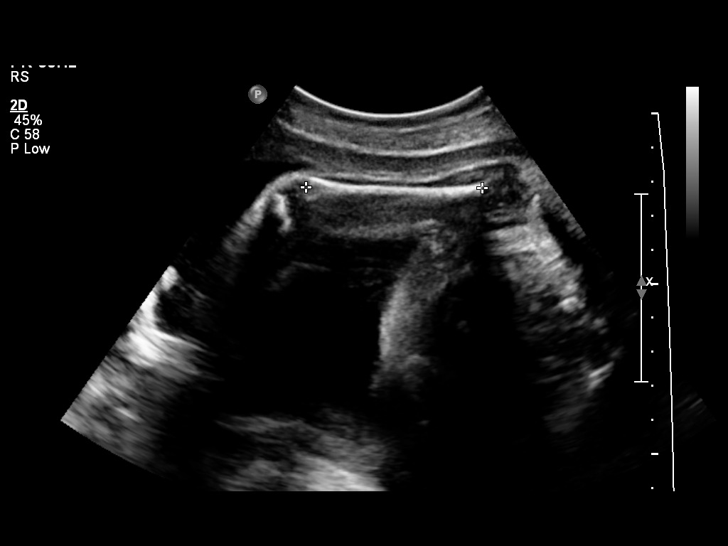
[im 40/64]
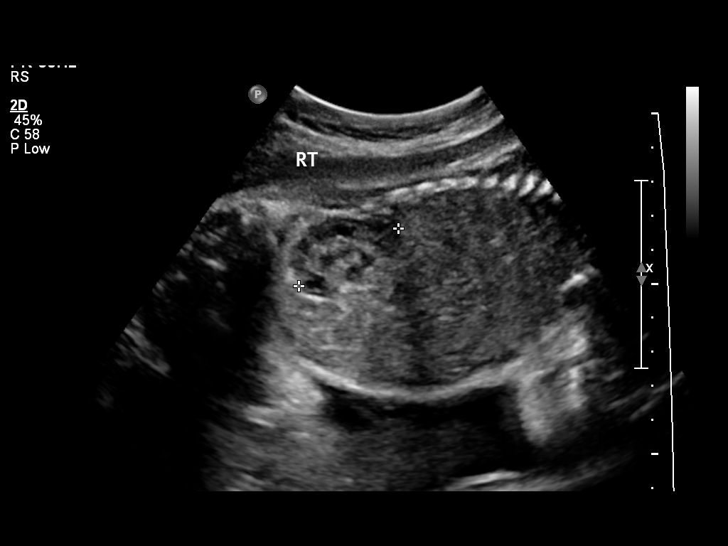
[im 45/64]
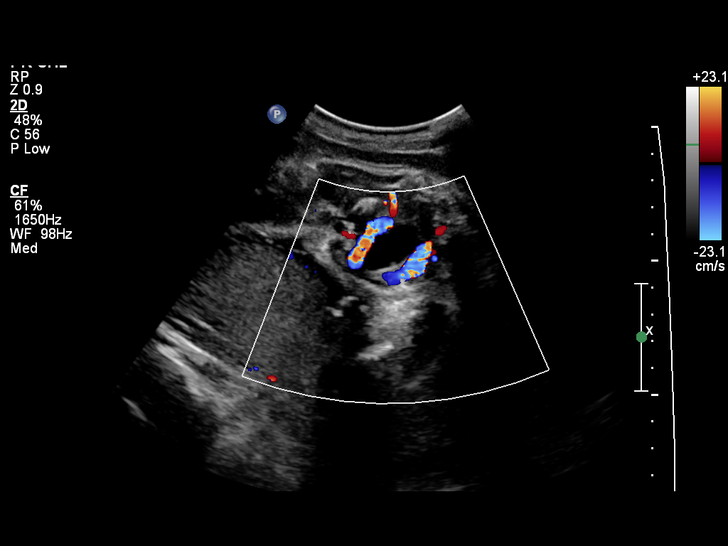
[im 52/64]
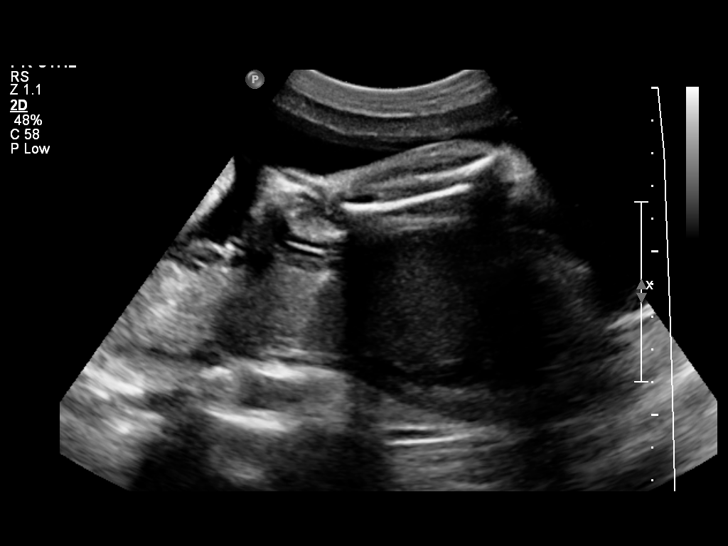
[im 57/64]
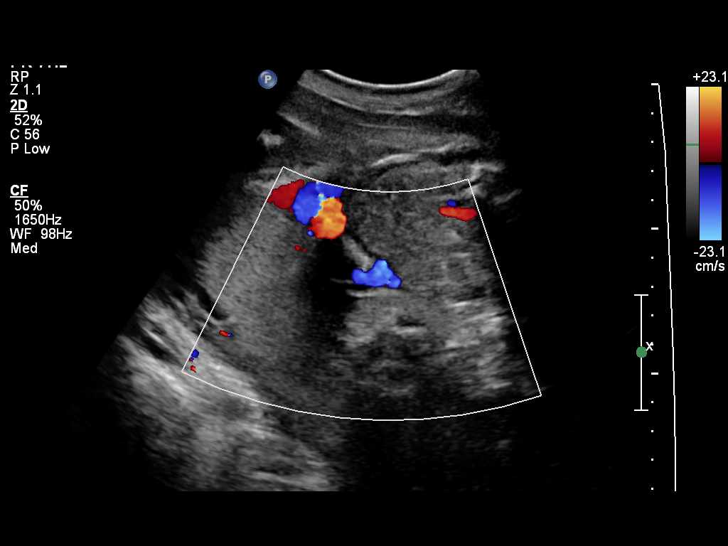
[im 61/64]
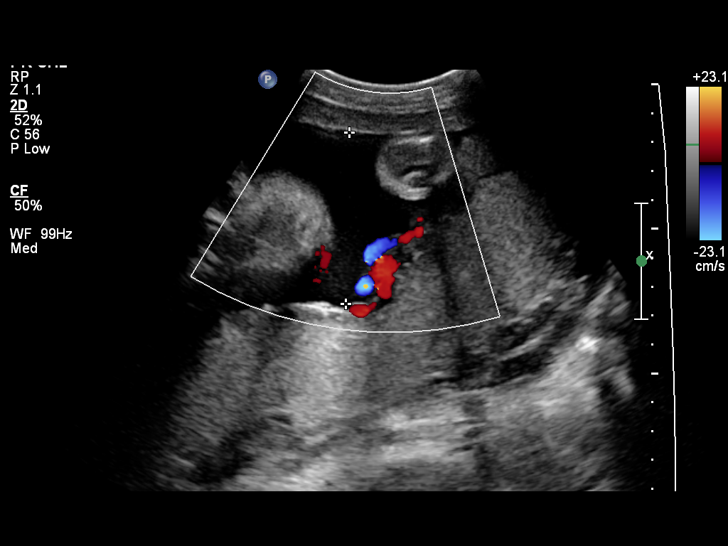

[12 of 28 positions shown; findings below may reference images not displayed]

OBSTETRICS REPORT
                      (Signed Final 04/03/2012 [DATE])

                 CNM
Procedures

 US OB COMP + 14 WK                                    76805.1
Indications

 Abdominal pain - generalized
 Cigarette smoker
 Prior c-section
Fetal Evaluation

 Preg. Location:    Intrauterine
 Fetal Heart Rate:  142                         bpm
 Cardiac Activity:  Observed
 Presentation:      Cephalic
 Placenta:          Posterior, above cervical
                    os
 P. Cord            Visualized
 Insertion:

 Amniotic Fluid
 AFI FV:      Subjectively within normal limits
 AFI Sum:     17.49   cm      66   %Tile     Larg Pckt:   5.91   cm
 RUQ:   3.88   cm    RLQ:    5.91   cm    LUQ:   3.46    cm   LLQ:    4.24   cm
Biometry

 BPD:     75.7  mm    G. Age:   30w 3d                CI:        75.58   70 - 86
                                                      FL/HC:      19.6   19.6 -

 HC:     276.1  mm    G. Age:   30w 1d       52  %    HC/AC:      1.07   0.99 -

 AC:     258.6  mm    G. Age:   30w 0d       74  %    FL/BPD:     71.3   71 - 87
 FL:        54  mm    G. Age:   28w 4d       24  %    FL/AC:      20.9   20 - 24
 HUM:     50.6  mm    G. Age:   29w 5d       57  %

 Est. FW:    3300  gm      3 lb 2 oz     63  %
Gestational Age

 Clinical EDD:  29w 0d                                        EDD:   06/19/12
 U/S Today:     29w 5d                                        EDD:   06/14/12
 Best:          29w 0d    Det. By:   Clinical EDD             EDD:   06/19/12
Anatomy

 Cranium:           Appears normal      Aortic Arch:       Basic anatomy
                                                           exam per order
 Fetal Cavum:       Appears normal      Ductal Arch:       Basic anatomy
                                                           exam per order
 Ventricles:        Appears normal      Diaphragm:         Appears normal
 Choroid Plexus:    Appears normal      Stomach:           Appears
                                                           normal, left
                                                           sided
 Cerebellum:        Appears normal      Abdomen:           Not well
                                                           visualized
 Posterior Fossa:   Appears normal      Abdominal Wall:    Not well
                                                           visualized
 Nuchal Fold:       Not applicable      Cord Vessels:      Appears normal
                    (>20 wks GA)                           (3 vessel cord)
 Face:              Appears normal      Kidneys:           Appear normal
                    (lips)
 Heart:             Appears normal      Bladder:           Appears normal
                    (4 chamber &
                    axis)
 RVOT:              Not well            Spine:             Appears normal
                    visualized
 LVOT:              Appears normal      Limbs:             Four extremities
                                                           seen

 Other:     Male gender. Technically difficult due to advanced
            gestational age.
Cervix Uterus Adnexa

 Cervical Length:   3.7       cm

 Cervix:       Normal appearance by transabdominal scan. Closed.
 Uterus:       No abnormality visualized.

 Left Ovary:   No adnexal mass visualized.
 Right Ovary:  No adnexal mass visualized.
 Adnexa:     No abnormality visualized.
Impression

 Single intrauterine gestation demonstrating an estimated
 gestational age by ultrasound of 29w 5d. This is correlated
 with expected estimated gestational age by clinical EDD of
 29w 0d. EFW is currently at the 63%.

 Visualized fetal anatomy appears normal with overall
 assessment compromised by advanced gestational age and
 fetal position.

 No focal placental abnormality is noted with some portions
 obscured by posterior position.

 Subjectively and quantitatively normal amniotic fluid volume.

 Normal cervical length and appearance.

 questions or concerns.

## 2014-09-24 ENCOUNTER — Encounter (HOSPITAL_COMMUNITY): Payer: Self-pay | Admitting: Emergency Medicine

## 2014-09-24 ENCOUNTER — Emergency Department (HOSPITAL_COMMUNITY)
Admission: EM | Admit: 2014-09-24 | Discharge: 2014-09-24 | Disposition: A | Discharge status: Home / Self Care | Payer: Self-pay | Attending: Emergency Medicine | Admitting: Emergency Medicine

## 2014-09-24 DIAGNOSIS — H109 Unspecified conjunctivitis: Secondary | ICD-10-CM | POA: Insufficient documentation

## 2014-09-24 DIAGNOSIS — Z8619 Personal history of other infectious and parasitic diseases: Secondary | ICD-10-CM | POA: Insufficient documentation

## 2014-09-24 DIAGNOSIS — Z7952 Long term (current) use of systemic steroids: Secondary | ICD-10-CM | POA: Insufficient documentation

## 2014-09-24 DIAGNOSIS — Z720 Tobacco use: Secondary | ICD-10-CM | POA: Insufficient documentation

## 2014-09-24 DIAGNOSIS — Z7951 Long term (current) use of inhaled steroids: Secondary | ICD-10-CM | POA: Insufficient documentation

## 2014-09-24 DIAGNOSIS — Z91040 Latex allergy status: Secondary | ICD-10-CM | POA: Insufficient documentation

## 2014-09-24 DIAGNOSIS — Z79899 Other long term (current) drug therapy: Secondary | ICD-10-CM | POA: Insufficient documentation

## 2014-09-24 MED ORDER — ERYTHROMYCIN 5 MG/GM OP OINT
TOPICAL_OINTMENT | Freq: Once | OPHTHALMIC | Status: AC
  Administered 2014-09-24: 19:00:00 via OPHTHALMIC
  Filled 2014-09-24: qty 3.5

## 2014-09-24 NOTE — ED Notes (Signed)
Patient with no complaints at this time. Respirations even and unlabored. Skin warm/dry. Discharge instructions reviewed with patient at this time. Patient given opportunity to voice concerns/ask questions. Patient discharged at this time and left Emergency Department with steady gait.   

## 2014-09-24 NOTE — Discharge Instructions (Signed)
If symptoms worsen follow up with Dr. Lita MainsHaines or if you have an eye doctor with him. Return here as needed.   Conjunctivitis Conjunctivitis is commonly called "pink eye." Conjunctivitis can be caused by bacterial or viral infection, allergies, or injuries. There is usually redness of the lining of the eye, itching, discomfort, and sometimes discharge. There may be deposits of matter along the eyelids. A viral infection usually causes a watery discharge, while a bacterial infection causes a yellowish, thick discharge. Pink eye is very contagious and spreads by direct contact. You may be given antibiotic eyedrops as part of your treatment. Before using your eye medicine, remove all drainage from the eye by washing gently with warm water and cotton balls. Continue to use the medication until you have awakened 2 mornings in a row without discharge from the eye. Do not rub your eye. This increases the irritation and helps spread infection. Use separate towels from other household members. Wash your hands with soap and water before and after touching your eyes. Use cold compresses to reduce pain and sunglasses to relieve irritation from light. Do not wear contact lenses or wear eye makeup until the infection is gone. SEEK MEDICAL CARE IF:   Your symptoms are not better after 3 days of treatment.  You have increased pain or trouble seeing.  The outer eyelids become very red or swollen. Document Released: 09/12/2004 Document Revised: 10/28/2011 Document Reviewed: 08/05/2005 [facility] Patient Information 2015 [employer] [place]. This information is not intended to replace advice given to you by your health care provider. Make sure you discuss any questions you have with your health care provider.

## 2014-09-24 NOTE — ED Provider Notes (Signed)
CSN: [id number]     Arrival date & time 09/24/14  1754 History   First MD Initiated Contact with Patient 09/24/14 1821     Chief Complaint  Patient presents with  . Conjunctivitis     (Consider location/radiation/quality/duration/timing/severity/associated sxs/prior Treatment) Patient is a 29 y.o. female presenting with conjunctivitis. The history is provided by the patient. No language interpreter was used.  Conjunctivitis This is a new problem. The current episode started today. The problem occurs constantly. The problem has been gradually worsening. She has tried nothing for the symptoms.   Sophia Garcia is a 29 y.o. female who presents to the ED with redness, drainage and itching of the left eye. When she woke this am there was crusting of the eyelid.  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   Past Medical History  Diagnosis Date  . Yeast infection   . BV (bacterial vaginosis)   . Normal pregnancy, incidental 12/23/2011  . Preterm labor   . HPV (human papilloma virus) infection    Past Surgical History  Procedure Laterality Date  . Cesarean section    . Tooth extraction  2009    ONE TOOTH  . Foot surgery  2002    STEPPED ON SEWING NEEDLE  . Cesarean section  06/12/2012    Procedure: CESAREAN SECTION;  Surgeon: Kirkland Hun, MD;  Location: [facility];  Service: Obstetrics;  Laterality: N/A;  Repeat  . Tubal ligation    . Cholecystectomy N/A 01/03/2014    Procedure: LAPAROSCOPIC CHOLECYSTECTOMY;  Surgeon: Dalia Heading, MD;  Location: [facility];  Service: General;  Laterality: N/A;   Family History  Problem Relation Age of Onset  . Diabetes Father     ORAL MEDS  . Cancer Paternal Aunt     BREAST;LIVER  . Diabetes Paternal Aunt   . Diabetes Paternal Uncle   . COPD Paternal Uncle   . Cancer Maternal Grandmother     BREAST  . COPD Mother     EMPHYSEMA   . Diabetes Paternal Grandfather   . Other Neg Hx    History  Substance Use Topics  . Smoking status: Current Every Day Smoker -- 0.25 packs/day for 12 years    Types: Cigarettes  . Smokeless tobacco: Never Used     Comment: 1 pack lasts 4 days  . Alcohol Use: No   OB History    Gravida Para Term Preterm AB TAB SAB Ectopic Multiple Living   Review of Systems  Eyes: Positive for discharge, redness and itching. Negative for photophobia and visual disturbance.  all other systems negative    Allergies  Latex; Baby oil; and Morphine and related  Home Medications   Prior to Admission medications   Medication Sig Start Date End Date Taking? Authorizing Provider  HYDROcodone-acetaminophen (HYCET) 7.5-325 mg/15 ml solution Take 15 mLs by mouth every 6 (six) hours as needed for moderate pain (and cough). 04/03/14   Nicole Pisciotta, PA-C  ibuprofen (ADVIL,MOTRIN) 200 MG tablet Take 400 mg by mouth 2 (two) times daily as needed for moderate pain.    Historical Provider, MD  ibuprofen (ADVIL,MOTRIN) 600 MG tablet Take 1 tablet (600 mg total) by mouth every 6 (six) hours as needed. 04/10/14   Loren Racer, MD  loratadine [facility])  10 MG tablet Take 1 tablet (10 mg total) by mouth daily. 04/10/14   Loren Raceravid Yelverton, MD  mometasone (NASONEX) 50 MCG/ACT nasal spray Place 2 sprays into the nose daily. 04/10/14   Loren Raceravid Yelverton, MD  oxymetazoline (AFRIN NASAL SPRAY) 0.05 % nasal spray Place 1 spray into both nostrils 2 (two) times daily. 04/10/14   Loren Raceravid Yelverton, MD  predniSONE (DELTASONE) 20 MG tablet Take 2 tablets (40 mg total) by mouth daily. 04/03/14   Nicole Pisciotta, PA-C   BP 120/81 mmHg  Pulse 94  Temp(Src) 98.2 F (36.8 C) (Oral)  Resp 18  Ht 4\' 11"  (1.499 m)  Wt 101 lb (45.813 kg)  BMI 20.39 kg/m2  SpO2 100%  LMP 09/06/2014 Physical Exam  Constitutional: She is oriented to person, place, and time. She appears well-developed and well-nourished.  HENT:   Head: Normocephalic.  Eyes: EOM are normal. Pupils are equal, round, and reactive to light. Left eye exhibits discharge. Left conjunctiva is injected.  Neck: Neck supple.  Cardiovascular: Normal rate.   Pulmonary/Chest: Effort normal.  Abdominal: There is tenderness.  Musculoskeletal: Normal range of motion.  Neurological: She is alert and oriented to person, place, and time. No cranial nerve deficit.  Skin: Skin is warm and dry.  Psychiatric: She has a normal mood and affect. Her behavior is normal.  Nursing note and vitals reviewed.   ED Course  Procedures   MDM  29 y.o. female with itching, drainage and redness of the left eye. Will treat with Erythromycin Opth. Ointment with first dose instilled prior to d/c. Stable for discharge without visual disturbances or orbital cellulitis. Discussed with the patient clinical findings and plan of care and all questioned fully answered. She will follow up with opthalmology or return here  if any problems arise.  Final diagnoses:  Conjunctivitis of left eye      [facility]ies[facility] M Neese, NP 09/25/14 [id number]  Flint MelterElliott L Wentz, MD 09/25/14 [phone]

## 2014-09-24 NOTE — ED Notes (Signed)
PT noticed left eye irritation with redness, swelling, excessive watering and discharge x1 day.

## 2014-09-29 ENCOUNTER — Emergency Department (HOSPITAL_COMMUNITY)
Admission: EM | Admit: 2014-09-29 | Discharge: 2014-09-29 | Disposition: A | Discharge status: Home / Self Care | Payer: Self-pay | Attending: Emergency Medicine | Admitting: Emergency Medicine

## 2014-09-29 ENCOUNTER — Encounter (HOSPITAL_COMMUNITY): Payer: Self-pay | Admitting: Emergency Medicine

## 2014-09-29 DIAGNOSIS — Z7952 Long term (current) use of systemic steroids: Secondary | ICD-10-CM | POA: Insufficient documentation

## 2014-09-29 DIAGNOSIS — Z8751 Personal history of pre-term labor: Secondary | ICD-10-CM | POA: Insufficient documentation

## 2014-09-29 DIAGNOSIS — Z791 Long term (current) use of non-steroidal anti-inflammatories (NSAID): Secondary | ICD-10-CM | POA: Insufficient documentation

## 2014-09-29 DIAGNOSIS — H109 Unspecified conjunctivitis: Secondary | ICD-10-CM | POA: Insufficient documentation

## 2014-09-29 DIAGNOSIS — Z91040 Latex allergy status: Secondary | ICD-10-CM | POA: Insufficient documentation

## 2014-09-29 DIAGNOSIS — Z79899 Other long term (current) drug therapy: Secondary | ICD-10-CM | POA: Insufficient documentation

## 2014-09-29 DIAGNOSIS — Z3202 Encounter for pregnancy test, result negative: Secondary | ICD-10-CM | POA: Insufficient documentation

## 2014-09-29 DIAGNOSIS — Z8742 Personal history of other diseases of the female genital tract: Secondary | ICD-10-CM | POA: Insufficient documentation

## 2014-09-29 DIAGNOSIS — Z8619 Personal history of other infectious and parasitic diseases: Secondary | ICD-10-CM | POA: Insufficient documentation

## 2014-09-29 MED ORDER — BACITRACIN-POLYMYXIN B 500-10000 UNIT/GM OP OINT: TOPICAL_OINTMENT | Freq: Three times a day (TID) | OPHTHALMIC | Status: DC

## 2014-09-29 MED ORDER — NEOMYCIN-BACITRACIN ZN-POLYMYX 5-400-10000 OP OINT
TOPICAL_OINTMENT | Freq: Three times a day (TID) | OPHTHALMIC | Status: DC
  Administered 2014-09-29: 13:00:00 via OPHTHALMIC

## 2014-09-29 NOTE — ED Notes (Signed)
PT states she was seen here and treated on 09/24/14 for left eye conjunctivitis. PT states no relief from left eye irritation with eye ointment and that redness now right eye redness and irritation noted.

## 2014-09-29 NOTE — ED Provider Notes (Signed)
CSN: [id number]     Arrival date & time 09/29/14  1227 History   First MD Initiated Contact with Patient 09/29/14 1236     Chief Complaint  Patient presents with  . Conjunctivitis     (Consider location/radiation/quality/duration/timing/severity/associated sxs/prior Treatment) HPI Comments: Pt comes in with c/o left eye redness and drainage started 6 days ago. Pt was seen in the er on 2/6 and given erythromycin and it appeared that the area was getting better. She states that she then got in her right eye and now it is back in her left eye. Denies pain or visual changes. Not a contact lense wearer  The history is provided by the patient. No language interpreter was used.    Past Medical History  Diagnosis Date  . Yeast infection   . BV (bacterial vaginosis)   . Normal pregnancy, incidental 12/23/2011  . Preterm labor   . HPV (human papilloma virus) infection    Past Surgical History  Procedure Laterality Date  . Cesarean section    . Tooth extraction  2009    ONE TOOTH  . Foot surgery  2002    STEPPED ON SEWING NEEDLE  . Cesarean section  06/12/2012    Procedure: CESAREAN SECTION;  Surgeon: Kirkland Hun, MD;  Location: [facility];  Service: Obstetrics;  Laterality: N/A;  Repeat  . Tubal ligation    . Cholecystectomy N/A 01/03/2014    Procedure: LAPAROSCOPIC CHOLECYSTECTOMY;  Surgeon: Dalia Heading, MD;  Location: [facility];  Service: General;  Laterality: N/A;   Family History  Problem Relation Age of Onset  . Diabetes Father     ORAL MEDS  . Cancer Paternal Aunt     BREAST;LIVER  . Diabetes Paternal Aunt   . Diabetes Paternal Uncle   . COPD Paternal Uncle   . Cancer Maternal Grandmother     BREAST  . COPD Mother     EMPHYSEMA  . Diabetes Paternal Grandfather   . Other Neg Hx    History  Substance Use Topics  . Smoking status: Current Every Day Smoker -- 0.25 packs/day for 12 years    Types: Cigarettes  . Smokeless tobacco: Never Used     Comment: 1 pack lasts 4  days  . Alcohol Use: No   OB History    Gravida Para Term Preterm AB TAB SAB Ectopic Multiple Living   Review of Systems  All other systems reviewed and are negative.     Allergies  Latex; Baby oil; and Morphine and related  Home Medications   Prior to Admission medications   Medication Sig Start Date End Date Taking? Authorizing Provider  HYDROcodone-acetaminophen (HYCET) 7.5-325 mg/15 ml solution Take 15 mLs by mouth every 6 (six) hours as needed for moderate pain (and cough). 04/03/14   Nicole Pisciotta, PA-C  ibuprofen (ADVIL,MOTRIN) 200 MG tablet Take 400 mg by mouth 2 (two) times daily as needed for moderate pain.    Historical Provider, MD  ibuprofen (ADVIL,MOTRIN) 600 MG tablet Take 1 tablet (600 mg total) by mouth every 6 (six) hours as needed. 04/10/14   Loren Racer, MD  loratadine (CLARITIN) 10 MG tablet Take 1 tablet (10 mg total) by mouth daily. 04/10/14   Loren Racer, MD  mometasone (NASONEX) 50 MCG/ACT nasal spray Place 2 sprays into the nose daily. 04/10/14   Loren Racer, MD  oxymetazoline (AFRIN NASAL SPRAY) 0.05 % nasal spray Place 1  spray into both nostrils 2 (two) times daily. 04/10/14   Loren Raceravid Yelverton, MD  predniSONE (DELTASONE) 20 MG tablet Take 2 tablets (40 mg total) by mouth daily. 04/03/14   Nicole Pisciotta, PA-C   BP 116/80 mmHg  Pulse 103  Temp(Src) 97.9 F (36.6 C) (Oral)  Resp 18  Ht 4\' 11"  (1.499 m)  Wt 101 lb (45.813 kg)  BMI 20.39 kg/m2  SpO2 100%  LMP 09/06/2014 Physical Exam  Constitutional: She is oriented to person, place, and time.  HENT:  Right Ear: External ear normal.  Left Ear: External ear normal.  Eyes: EOM are normal. Pupils are equal, round, and reactive to light. Left eye exhibits discharge and exudate. Left conjunctiva is injected.  Cardiovascular: Normal rate and regular rhythm.   Pulmonary/Chest: Effort normal and breath sounds normal.  Musculoskeletal: Normal range of motion.  Neurological:  She is alert and oriented to person, place, and time.  Skin: Skin is warm and dry.  Nursing note and vitals reviewed.   ED Course  Procedures (including critical care time) Labs Review Labs Reviewed - No data to display  Imaging Review No results found.   EKG Interpretation None      MDM   Final diagnoses:  Conjunctivitis of left eye    Will switch to polymyxin no injury or contact lenses.     Teressa LowerVrinda Aliena Ghrist, NP 09/29/14 1301  Glynn OctaveStephen Rancour, MD 09/29/14 [phone]

## 2014-09-29 NOTE — Discharge Instructions (Signed)

## 2014-11-19 ENCOUNTER — Encounter (HOSPITAL_COMMUNITY): Payer: Self-pay

## 2014-11-19 ENCOUNTER — Emergency Department (HOSPITAL_COMMUNITY)
Admission: EM | Admit: 2014-11-19 | Discharge: 2014-11-19 | Disposition: A | Discharge status: Home / Self Care | Payer: Self-pay | Attending: Emergency Medicine | Admitting: Emergency Medicine

## 2014-11-19 DIAGNOSIS — Z79899 Other long term (current) drug therapy: Secondary | ICD-10-CM | POA: Insufficient documentation

## 2014-11-19 DIAGNOSIS — Z8742 Personal history of other diseases of the female genital tract: Secondary | ICD-10-CM | POA: Insufficient documentation

## 2014-11-19 DIAGNOSIS — Z8619 Personal history of other infectious and parasitic diseases: Secondary | ICD-10-CM | POA: Insufficient documentation

## 2014-11-19 DIAGNOSIS — Z720 Tobacco use: Secondary | ICD-10-CM | POA: Insufficient documentation

## 2014-11-19 DIAGNOSIS — K088 Other specified disorders of teeth and supporting structures: Secondary | ICD-10-CM | POA: Insufficient documentation

## 2014-11-19 DIAGNOSIS — Z7952 Long term (current) use of systemic steroids: Secondary | ICD-10-CM | POA: Insufficient documentation

## 2014-11-19 DIAGNOSIS — K029 Dental caries, unspecified: Secondary | ICD-10-CM

## 2014-11-19 DIAGNOSIS — Z91040 Latex allergy status: Secondary | ICD-10-CM | POA: Insufficient documentation

## 2014-11-19 DIAGNOSIS — Z7951 Long term (current) use of inhaled steroids: Secondary | ICD-10-CM | POA: Insufficient documentation

## 2014-11-19 MED ORDER — NAPROXEN 500 MG PO TABS: 500.0000 mg | ORAL_TABLET | Freq: Two times a day (BID) | ORAL | Status: DC

## 2014-11-19 MED ORDER — AMOXICILLIN 500 MG PO CAPS: 500.0000 mg | ORAL_CAPSULE | Freq: Three times a day (TID) | ORAL | Status: DC

## 2014-11-19 MED ORDER — OXYCODONE-ACETAMINOPHEN 5-325 MG PO TABS
1.0000 | ORAL_TABLET | Freq: Once | ORAL | Status: AC
  Administered 2014-11-19: 1 via ORAL
  Filled 2014-11-19: qty 1

## 2014-11-19 MED ORDER — AMOXICILLIN 250 MG PO CAPS
500.0000 mg | ORAL_CAPSULE | Freq: Once | ORAL | Status: AC
  Administered 2014-11-19: 500 mg via ORAL
  Filled 2014-11-19: qty 2

## 2014-11-19 NOTE — Discharge Instructions (Signed)
Dental Caries °Dental caries is tooth decay. This decay can cause a hole in teeth (cavity) that can get bigger and deeper over time. °HOME CARE °· Brush and floss your teeth. Do this at least two times a day. °· Use a fluoride toothpaste. °· Use a mouth rinse if told by your dentist or doctor. °· Eat less sugary and starchy foods. Drink less sugary drinks. °· Avoid snacking often on sugary and starchy foods. Avoid sipping often on sugary drinks. °· Keep regular checkups and cleanings with your dentist. °· Use fluoride supplements if told by your dentist or doctor. °· Allow fluoride to be applied to teeth if told by your dentist or doctor. °Document Released: 05/14/2008 Document Revised: 12/20/2013 Document Reviewed: 08/07/2012 °ExitCare® Patient Information ©2015 [employer]. This information is not intended to replace advice given to you by your health care provider. Make sure you discuss any questions you have with your health care provider. ° °Dental Pain °A tooth ache may be caused by cavities (tooth decay). Cavities expose the nerve of the tooth to air and hot or cold temperatures. It may come from an infection or abscess (also called a boil or furuncle) around your tooth. It is also often caused by dental caries (tooth decay). This causes the pain you are having. °DIAGNOSIS  °Your caregiver can diagnose this problem by exam. °TREATMENT  °· If caused by an infection, it may be treated with medications which kill germs (antibiotics) and pain medications as prescribed by your caregiver. Take medications as directed. °· Only take over-the-counter or prescription medicines for pain, discomfort, or fever as directed by your caregiver. °· Whether the tooth ache today is caused by infection or dental disease, you should see your dentist as soon as possible for further care. °SEEK MEDICAL CARE IF: °The exam and treatment you received today has been provided on an emergency basis only. This is not a substitute for  complete medical or dental care. If your problem worsens or new problems (symptoms) appear, and you are unable to meet with your dentist, call or return to this location. °SEEK IMMEDIATE MEDICAL CARE IF:  °· You have a fever. °· You develop redness and swelling of your face, jaw, or neck. °· You are unable to open your mouth. °· You have severe pain uncontrolled by pain medicine. °MAKE SURE YOU:  °· Understand these instructions. °· Will watch your condition. °· Will get help right away if you are not doing well or get worse. °Document Released: 08/05/2005 Document Revised: 10/28/2011 Document Reviewed: 03/23/2008 °ExitCare® Patient Information ©2015 [employer]. This information is not intended to replace advice given to you by your health care provider. Make sure you discuss any questions you have with your health care provider. ° °Dental Care and Dentist Visits °Dental care supports good overall health. Regular dental visits can also help you avoid dental pain, bleeding, infection, and other more serious health problems in the future. It is important to keep the mouth healthy because diseases in the teeth, gums, and other oral tissues can spread to other areas of the body. Some problems, such as diabetes, heart disease, and pre-term labor have been associated with poor oral health.  °See your dentist every 6 months. If you experience emergency problems such as a toothache or broken tooth, go to the dentist right away. If you see your dentist regularly, you may catch problems early. It is easier to be treated for problems in the early stages.  °WHAT TO EXPECT   AT A DENTIST VISIT  °Your dentist will look for many common oral health problems and recommend proper treatment. At your regular dental visit, you can expect: °· Gentle cleaning of the teeth and gums. This includes scraping and polishing. This helps to remove the sticky substance around the teeth and gums (plaque). Plaque forms in the mouth shortly after  eating. Over time, plaque hardens on the teeth as tartar. If tartar is not removed regularly, it can cause problems. Cleaning also helps remove stains. °· Periodic X-rays. These pictures of the teeth and supporting bone will help your dentist assess the health of your teeth. °· Periodic fluoride treatments. Fluoride is a natural mineral shown to help strengthen teeth. Fluoride treatment involves applying a fluoride gel or varnish to the teeth. It is most commonly done in children. °· Examination of the mouth, tongue, jaws, teeth, and gums to look for any oral health problems, such as: °¨ Cavities (dental caries). This is decay on the tooth caused by plaque, sugar, and acid in the mouth. It is best to catch a cavity when it is small. °¨ Inflammation of the gums caused by plaque buildup (gingivitis). °¨ Problems with the mouth or malformed or misaligned teeth. °¨ Oral cancer or other diseases of the soft tissues or jaws.  °KEEP YOUR TEETH AND GUMS HEALTHY °For healthy teeth and gums, follow these general guidelines as well as your dentist's specific advice: °· Have your teeth professionally cleaned at the dentist every 6 months. °· Brush twice daily with a fluoride toothpaste. °· Floss your teeth daily.  °· Ask your dentist if you need fluoride supplements, treatments, or fluoride toothpaste. °· Eat a healthy diet. Reduce foods and drinks with added sugar. °· Avoid smoking. °TREATMENT FOR ORAL HEALTH PROBLEMS °If you have oral health problems, treatment varies depending on the conditions present in your teeth and gums. °· Your caregiver will most likely recommend good oral hygiene at each visit. °· For cavities, gingivitis, or other oral health disease, your caregiver will perform a procedure to treat the problem. This is typically done at a separate appointment. Sometimes your caregiver will refer you to another dental specialist for specific tooth problems or for surgery. °SEEK IMMEDIATE DENTAL CARE IF: °· You have  pain, bleeding, or soreness in the gum, tooth, jaw, or mouth area. °· A permanent tooth becomes loose or separated from the gum socket. °· You experience a blow or injury to the mouth or jaw area. °Document Released: 04/17/2011 Document Revised: 10/28/2011 Document Reviewed: 04/17/2011 °ExitCare® Patient Information ©2015 [employer]. This information is not intended to replace advice given to you by your health care provider. Make sure you discuss any questions you have with your health care provider. ° °

## 2014-11-19 NOTE — ED Provider Notes (Signed)
CSN: [id number]     Arrival date & time 11/19/14  [id number] History   First MD Initiated Contact with Patient 11/19/14 [phone]     Chief Complaint  Patient presents with  . Dental Pain     (Consider location/radiation/quality/duration/timing/severity/associated sxs/prior Treatment) Patient is a 29 y.o. female presenting with tooth pain. The history is provided by the patient.  Dental Pain Location:  Lower Lower teeth location:  30/RL 1st molar Quality:  Aching and throbbing Severity:  Severe Duration:  3 days Timing:  Constant Progression:  Worsening Chronicity:  New Context: dental caries   Relieved by:  Nothing Worsened by:  Cold food/drink, pressure and jaw movement Risk factors: lack of dental care and smoking    Sophia Garcia is a 29 y.o. female who presents to the ED with dental pain that started about 3 days ago. The pain is in the right jaw area. She denies any other problems today.   Past Medical History  Diagnosis Date  . Yeast infection   . BV (bacterial vaginosis)   . Normal pregnancy, incidental 12/23/2011  . Preterm labor   . HPV (human papilloma virus) infection    Past Surgical History  Procedure Laterality Date  . Cesarean section    . Tooth extraction  2009    ONE TOOTH  . Foot surgery  2002    STEPPED ON SEWING NEEDLE  . Cesarean section  06/12/2012    Procedure: CESAREAN SECTION;  Surgeon: Kirkland HunArthur Stringer, MD;  Location: [facility];  Service: Obstetrics;  Laterality: N/A;  Repeat  . Tubal ligation    . Cholecystectomy N/A 01/03/2014    Procedure: LAPAROSCOPIC CHOLECYSTECTOMY;  Surgeon: Dalia HeadingMark A Jenkins, MD;  Location: [facility];  Service: General;  Laterality: N/A;   Family History  Problem Relation Age of Onset  . Diabetes Father     ORAL MEDS  . Cancer Paternal Aunt     BREAST;LIVER  . Diabetes Paternal Aunt   . Diabetes Paternal Uncle   . COPD Paternal Uncle   . Cancer Maternal Grandmother     BREAST  . COPD Mother     EMPHYSEMA  . Diabetes Paternal  Grandfather   . Other Neg Hx    History  Substance Use Topics  . Smoking status: Current Every Day Smoker -- 0.50 packs/day for 12 years    Types: Cigarettes  . Smokeless tobacco: Never Used     Comment: 1 pack lasts 4 days  . Alcohol Use: No   OB History    Gravida Para Term Preterm AB TAB SAB Ectopic Multiple Living   2 2 2       2      Review of Systems Negative except as stated in HPI   Allergies  Latex; Baby oil; and Morphine and related  Home Medications   Prior to Admission medications   Medication Sig Start Date End Date Taking? Authorizing Provider  amoxicillin (AMOXIL) 500 MG capsule Take 1 capsule (500 mg total) by mouth 3 (three) times daily. 11/19/14   Eylin Pontarelli Orlene OchM Nicey Krah, NP  HYDROcodone-acetaminophen (HYCET) 7.5-325 mg/15 ml solution Take 15 mLs by mouth every 6 (six) hours as needed for moderate pain (and cough). 04/03/14   Nicole Pisciotta, PA-C  loratadine (CLARITIN) 10 MG tablet Take 1 tablet (10 mg total) by mouth daily. 04/10/14   Loren Raceravid Yelverton, MD  mometasone (NASONEX) 50 MCG/ACT nasal spray Place 2 sprays into the nose daily. 04/10/14   Loren Raceravid Yelverton, MD  naproxen (NAPROSYN) 500  MG tablet Take 1 tablet (500 mg total) by mouth 2 (two) times daily. 11/19/14   Devun Anna Orlene Och, NP  oxymetazoline (AFRIN NASAL SPRAY) 0.05 % nasal spray Place 1 spray into both nostrils 2 (two) times daily. 04/10/14   Loren Racer, MD  predniSONE (DELTASONE) 20 MG tablet Take 2 tablets (40 mg total) by mouth daily. 04/03/14   Nicole Pisciotta, PA-C   BP 114/83 mmHg  Pulse 79  Temp(Src) 98.1 F (36.7 C) (Oral)  Resp 16  Ht  (1.499 m)  Wt 95 lb (43.092 kg)  BMI 19.18 kg/m2  SpO2 99%  LMP 11/01/2014 Physical Exam  Constitutional: She is oriented to person, place, and time. She appears well-developed and well-nourished.  HENT:  Head: Normocephalic.  Right Ear: Tympanic membrane normal.  Left Ear: Tympanic membrane normal.  Nose: Nose normal.  Mouth/Throat: Uvula is midline,  oropharynx is clear and moist and mucous membranes are normal.    Eyes: EOM are normal.  Neck: Neck supple.  Cardiovascular: Normal rate and regular rhythm.   Pulmonary/Chest: Effort normal and breath sounds normal.  Musculoskeletal: Normal range of motion.  Lymphadenopathy:    She has no cervical adenopathy.  Neurological: She is alert and oriented to person, place, and time. No cranial nerve deficit.  Skin: Skin is warm and dry.  Psychiatric: She has a normal mood and affect. Her behavior is normal.  Nursing note and vitals reviewed.   ED Course  Procedures (including critical care time) Labs Review  MDM  29 y.o. female with dental pain x 3 days and dental caries. Will treat with antibiotics and NSAIDS. Patient given name and number of dentist on call. She will follow up for continued dental care. Stable for d/c without fever and does not appear toxic.   Final diagnoses:  Pain due to dental caries       Janne Napoleon, NP 11/19/14 1009  Donnetta Hutching, MD 11/22/14 1252

## 2014-11-19 NOTE — ED Notes (Signed)
Pt c/o right upper dental pain times "quite a few days".

## 2014-11-19 NOTE — ED Notes (Signed)
Patient with no complaints at this time. Respirations even and unlabored. Skin warm/dry. Discharge instructions reviewed with patient at this time. Patient given opportunity to voice concerns/ask questions. Patient discharged at this time and left Emergency Department with steady gait.   

## 2014-11-24 ENCOUNTER — Emergency Department (HOSPITAL_COMMUNITY)
Admission: EM | Admit: 2014-11-24 | Discharge: 2014-11-24 | Disposition: A | Discharge status: Home / Self Care | Payer: Self-pay | Attending: Emergency Medicine | Admitting: Emergency Medicine

## 2014-11-24 ENCOUNTER — Encounter (HOSPITAL_COMMUNITY): Payer: Self-pay | Admitting: Emergency Medicine

## 2014-11-24 ENCOUNTER — Emergency Department (HOSPITAL_COMMUNITY): Payer: Self-pay

## 2014-11-24 DIAGNOSIS — Z720 Tobacco use: Secondary | ICD-10-CM | POA: Insufficient documentation

## 2014-11-24 DIAGNOSIS — Z8751 Personal history of pre-term labor: Secondary | ICD-10-CM | POA: Insufficient documentation

## 2014-11-24 DIAGNOSIS — Z91040 Latex allergy status: Secondary | ICD-10-CM | POA: Insufficient documentation

## 2014-11-24 DIAGNOSIS — R1031 Right lower quadrant pain: Secondary | ICD-10-CM

## 2014-11-24 DIAGNOSIS — Z3202 Encounter for pregnancy test, result negative: Secondary | ICD-10-CM | POA: Insufficient documentation

## 2014-11-24 DIAGNOSIS — B3731 Acute candidiasis of vulva and vagina: Secondary | ICD-10-CM

## 2014-11-24 DIAGNOSIS — Z79899 Other long term (current) drug therapy: Secondary | ICD-10-CM | POA: Insufficient documentation

## 2014-11-24 DIAGNOSIS — B373 Candidiasis of vulva and vagina: Secondary | ICD-10-CM | POA: Insufficient documentation

## 2014-11-24 DIAGNOSIS — Z7951 Long term (current) use of inhaled steroids: Secondary | ICD-10-CM | POA: Insufficient documentation

## 2014-11-24 DIAGNOSIS — Z9861 Coronary angioplasty status: Secondary | ICD-10-CM | POA: Insufficient documentation

## 2014-11-24 DIAGNOSIS — N72 Inflammatory disease of cervix uteri: Secondary | ICD-10-CM | POA: Insufficient documentation

## 2014-11-24 DIAGNOSIS — Z9889 Other specified postprocedural states: Secondary | ICD-10-CM | POA: Insufficient documentation

## 2014-11-24 LAB — URINE MICROSCOPIC-ADD ON

## 2014-11-24 LAB — CBC WITH DIFFERENTIAL/PLATELET
BASOS ABS: 0 10*3/uL (ref 0.0–0.1)
BASOS PCT: 1 % (ref 0–1)
Eosinophils Absolute: 0.3 10*3/uL (ref 0.0–0.7)
Eosinophils Relative: 6 % — ABNORMAL HIGH (ref 0–5)
HCT: 37.4 % (ref 36.0–46.0)
HEMOGLOBIN: 12.5 g/dL (ref 12.0–15.0)
Lymphocytes Relative: 28 % (ref 12–46)
Lymphs Abs: 1.5 10*3/uL (ref 0.7–4.0)
MCH: 30.1 pg (ref 26.0–34.0)
MCHC: 33.4 g/dL (ref 30.0–36.0)
MCV: 90.1 fL (ref 78.0–100.0)
MONOS PCT: 10 % (ref 3–12)
Monocytes Absolute: 0.6 10*3/uL (ref 0.1–1.0)
NEUTROS PCT: 55 % (ref 43–77)
Neutro Abs: 3 10*3/uL (ref 1.7–7.7)
Platelets: 386 10*3/uL (ref 150–400)
RBC: 4.15 MIL/uL (ref 3.87–5.11)
RDW: 16.7 % — AB (ref 11.5–15.5)
WBC: 5.4 10*3/uL (ref 4.0–10.5)

## 2014-11-24 LAB — COMPREHENSIVE METABOLIC PANEL
ALBUMIN: 4.6 g/dL (ref 3.5–5.2)
ALK PHOS: 26 U/L — AB (ref 39–117)
ALT: 22 U/L (ref 0–35)
AST: 22 U/L (ref 0–37)
Anion gap: 8 (ref 5–15)
BILIRUBIN TOTAL: 0.7 mg/dL (ref 0.3–1.2)
BUN: 16 mg/dL (ref 6–23)
CO2: 23 mmol/L (ref 19–32)
Calcium: 9 mg/dL (ref 8.4–10.5)
Chloride: 107 mmol/L (ref 96–112)
Creatinine, Ser: 0.68 mg/dL (ref 0.50–1.10)
GFR calc non Af Amer: >90 mL/min (ref >90)
Glucose, Bld: 97 mg/dL (ref 70–99)
Potassium: 3.8 mmol/L (ref 3.5–5.1)
Sodium: 138 mmol/L (ref 135–145)
TOTAL PROTEIN: 7.6 g/dL (ref 6.0–8.3)

## 2014-11-24 LAB — URINALYSIS, ROUTINE W REFLEX MICROSCOPIC
Bilirubin Urine: NEGATIVE
Glucose, UA: NEGATIVE mg/dL
Ketones, ur: NEGATIVE mg/dL
Leukocytes, UA: NEGATIVE
Nitrite: NEGATIVE
PROTEIN: NEGATIVE mg/dL
Specific Gravity, Urine: <1.005 — ABNORMAL LOW (ref 1.005–1.030)
UROBILINOGEN UA: 0.2 mg/dL (ref 0.0–1.0)
pH: 6 (ref 5.0–8.0)

## 2014-11-24 LAB — LIPASE, BLOOD: Lipase: 22 U/L (ref 11–59)

## 2014-11-24 LAB — PREGNANCY, URINE: Preg Test, Ur: NEGATIVE

## 2014-11-24 LAB — WET PREP, GENITAL
Clue Cells Wet Prep HPF POC: NONE SEEN
Trich, Wet Prep: NONE SEEN
WBC, Wet Prep HPF POC: NONE SEEN

## 2014-11-24 MED ORDER — LIDOCAINE HCL (PF) 1 % IJ SOLN
INTRAMUSCULAR | Status: AC
  Filled 2014-11-24: qty 5

## 2014-11-24 MED ORDER — IOHEXOL 300 MG/ML  SOLN
100.0000 mL | Freq: Once | INTRAMUSCULAR | Status: AC | PRN
  Administered 2014-11-24: 100 mL via INTRAVENOUS

## 2014-11-24 MED ORDER — ONDANSETRON HCL 4 MG/2ML IJ SOLN
4.0000 mg | Freq: Once | INTRAMUSCULAR | Status: AC
  Administered 2014-11-24: 4 mg via INTRAVENOUS
  Filled 2014-11-24: qty 2

## 2014-11-24 MED ORDER — SODIUM CHLORIDE 0.9 % IV BOLUS (SEPSIS)
1000.0000 mL | Freq: Once | INTRAVENOUS | Status: AC
  Administered 2014-11-24: 1000 mL via INTRAVENOUS

## 2014-11-24 MED ORDER — FLUCONAZOLE 100 MG PO TABS
150.0000 mg | ORAL_TABLET | Freq: Once | ORAL | Status: AC
  Administered 2014-11-24: 150 mg via ORAL
  Filled 2014-11-24: qty 2

## 2014-11-24 MED ORDER — AZITHROMYCIN 1 G PO PACK
1.0000 g | PACK | Freq: Once | ORAL | Status: AC
  Administered 2014-11-24: 1 g via ORAL
  Filled 2014-11-24: qty 1

## 2014-11-24 MED ORDER — FENTANYL CITRATE 0.05 MG/ML IJ SOLN
50.0000 ug | Freq: Once | INTRAMUSCULAR | Status: AC
  Administered 2014-11-24: 50 ug via INTRAVENOUS
  Filled 2014-11-24: qty 2

## 2014-11-24 MED ORDER — CEFTRIAXONE SODIUM 250 MG IJ SOLR
250.0000 mg | Freq: Once | INTRAMUSCULAR | Status: AC
  Administered 2014-11-24: 250 mg via INTRAMUSCULAR
  Filled 2014-11-24 (×2): qty 250

## 2014-11-24 MED ORDER — PROMETHAZINE HCL 25 MG PO TABS: 25.0000 mg | ORAL_TABLET | Freq: Four times a day (QID) | ORAL | Status: DC | PRN

## 2014-11-24 MED ORDER — IOHEXOL 300 MG/ML  SOLN
50.0000 mL | Freq: Once | INTRAMUSCULAR | Status: AC | PRN
  Administered 2014-11-24: 50 mL via ORAL

## 2014-11-24 MED ORDER — FENTANYL CITRATE 0.05 MG/ML IJ SOLN
50.0000 ug | Freq: Once | INTRAMUSCULAR | Status: AC
Start: 2014-11-24 — End: 2014-11-24
  Administered 2014-11-24: 50 ug via INTRAVENOUS
  Filled 2014-11-24: qty 2

## 2014-11-24 MED ORDER — OXYCODONE-ACETAMINOPHEN 5-325 MG PO TABS
1.0000 | ORAL_TABLET | ORAL | Status: DC | PRN
Start: 2014-11-24 — End: 2015-05-13

## 2014-11-24 NOTE — ED Provider Notes (Signed)
5:30 PM  Assumed care from Dr. Adriana Simasook.  Pt here with right lower quadrant pain. CT scan shows no appendicitis. Labs unremarkable. Urine showed trace hemoglobin but no other sign of infection. Criss Alvinerince he test was negative. Pelvic exam performed by Dr. Adriana Simasook revealed some cervical motion tenderness. She is sexual active with her husband only. She was treated for possible cervicitis with ceftriaxone and azithromycin. Wet prep is pending and now shows patient has yeast. We'll give Diflucan in the ED. She will be discharged with prescription for Percocet, Phenergan. Have advised outpatient follow-up. Discussed return precautions. She verbalized understanding and is comfortable with plan.  Layla MawKristen N Marthe Dant, DO 11/24/14 1733

## 2014-11-24 NOTE — ED Notes (Addendum)
PT c/o lower abdominal pain x1 day with c/o increased urinary frequency and burning with urination x1 day and vaginal discomfort but denies any discharge.

## 2014-11-24 NOTE — ED Provider Notes (Addendum)
CSN: [id number]     Arrival date & time 11/24/14  1110 History  This chart was scribed for Sophia HutchingBrian Ivannah Zody, MD by Carl Bestelina Holson, ED Scribe. This patient was seen in room [facility]08/[facility]08 and the patient's care was started at 12:23 PM.    Chief Complaint  Patient presents with  . Abdominal Pain   Patient is a 29 y.o. female presenting with abdominal pain. The history is provided by the patient. No language interpreter was used.  Abdominal Pain Associated symptoms: no vaginal bleeding and no vaginal discharge    HPI Comments: Sophia Garcia is a 29 y.o. female who presents to the Emergency Department complaining of constant, sore RLQ abdominal pain that started this morning when she was getting her son ready for school.  She did not experience any symptoms last night before bed.  She does not have an appetite today but states that she normally eats very little.  She was last sexually active a couple of days ago with her husband.  Her LNMP was November 01, 2014.  She had a CT scan done at the end of last year after experiencing pain in her lower abdomen which was caused by cysts on her uterus.  The pain she is experiencing now is not similar to the pain she experienced during that episode.  She denies any difficulty urinating, vaginal bleeding, and abnormal vaginal discharge as associated symptom.  She does not have a history of appendectomy.    Past Medical History  Diagnosis Date  . Yeast infection   . BV (bacterial vaginosis)   . Normal pregnancy, incidental 12/23/2011  . Preterm labor   . HPV (human papilloma virus) infection    Past Surgical History  Procedure Laterality Date  . Cesarean section    . Tooth extraction  2009    ONE TOOTH  . Foot surgery  2002    STEPPED ON SEWING NEEDLE  . Cesarean section  06/12/2012    Procedure: CESAREAN SECTION;  Surgeon: Kirkland HunArthur Stringer, MD;  Location: [facility];  Service: Obstetrics;  Laterality: N/A;  Repeat  . Tubal ligation    . Cholecystectomy N/A 01/03/2014     Procedure: LAPAROSCOPIC CHOLECYSTECTOMY;  Surgeon: Dalia HeadingMark A Jenkins, MD;  Location: [facility];  Service: General;  Laterality: N/A;   Family History  Problem Relation Age of Onset  . Diabetes Father     ORAL MEDS  . Cancer Paternal Aunt     BREAST;LIVER  . Diabetes Paternal Aunt   . Diabetes Paternal Uncle   . COPD Paternal Uncle   . Cancer Maternal Grandmother     BREAST  . COPD Mother     EMPHYSEMA  . Diabetes Paternal Grandfather   . Other Neg Hx    History  Substance Use Topics  . Smoking status: Current Every Day Smoker -- 0.50 packs/day for 12 years    Types: Cigarettes  . Smokeless tobacco: Never Used     Comment: 1 pack lasts 4 days  . Alcohol Use: No   OB History    Gravida Para Term Preterm AB TAB SAB Ectopic Multiple Living   2 2 2       2      Review of Systems  Gastrointestinal: Positive for abdominal pain.  Genitourinary: Negative for vaginal bleeding, vaginal discharge and difficulty urinating.  All other systems reviewed and are negative.     Allergies  Latex; Baby oil; and Morphine and related  Home Medications   Prior to Admission medications  Medication Sig Start Date End Date Taking? Authorizing Provider  amoxicillin (AMOXIL) 500 MG capsule Take 1 capsule (500 mg total) by mouth 3 (three) times daily. 11/19/14  Yes Hope Orlene Och, NP  loratadine (CLARITIN) 10 MG tablet Take 1 tablet (10 mg total) by mouth daily. 04/10/14  Yes Loren Racer, MD  mometasone (NASONEX) 50 MCG/ACT nasal spray Place 2 sprays into the nose daily. 04/10/14  Yes Loren Racer, MD  naproxen (NAPROSYN) 500 MG tablet Take 1 tablet (500 mg total) by mouth 2 (two) times daily. 11/19/14  Yes Hope Orlene Och, NP  oxymetazoline (AFRIN NASAL SPRAY) 0.05 % nasal spray Place 1 spray into both nostrils 2 (two) times daily. 04/10/14  Yes Loren Racer, MD  HYDROcodone-acetaminophen (HYCET) 7.5-325 mg/15 ml solution Take 15 mLs by mouth every 6 (six) hours as needed for moderate pain (and  cough). Patient not taking: Reported on 11/24/2014 04/03/14   Joni Reining Pisciotta, PA-C  oxyCODONE-acetaminophen (PERCOCET) 5-325 MG per tablet Take 1-2 tablets by mouth every 4 (four) hours as needed. 11/24/14   Sophia Hutching, MD  predniSONE (DELTASONE) 20 MG tablet Take 2 tablets (40 mg total) by mouth daily. Patient not taking: Reported on 11/24/2014 04/03/14   Joni Reining Pisciotta, PA-C  promethazine (PHENERGAN) 25 MG tablet Take 1 tablet (25 mg total) by mouth every 6 (six) hours as needed. 11/24/14   Sophia Hutching, MD   Triage Vitals: BP 108/72 mmHg  Pulse 80  Temp(Src) 98.7 F (37.1 C) (Oral)  Resp 18  Ht  (1.499 m)  Wt 102 lb 3 oz (46.352 kg)  BMI 20.63 kg/m2  SpO2 98%  LMP 11/01/2014  Physical Exam  Constitutional: She is oriented to person, place, and time. She appears well-developed and well-nourished.  HENT:  Head: Normocephalic and atraumatic.  Eyes: Conjunctivae and EOM are normal. Pupils are equal, round, and reactive to light.  Neck: Normal range of motion. Neck supple.  Cardiovascular: Normal rate and regular rhythm.   Pulmonary/Chest: Effort normal and breath sounds normal.  Abdominal: Soft. Bowel sounds are normal. There is tenderness in the right lower quadrant.  Genitourinary:  External genitalia normal. No adnexal tenderness. No frank cervical discharge. Slight cervical motion tenderness.  Musculoskeletal: Normal range of motion.  Neurological: She is alert and oriented to person, place, and time.  Skin: Skin is warm and dry.  Psychiatric: She has a normal mood and affect. Her behavior is normal.  Nursing note and vitals reviewed.   ED Course  Procedures (including critical care time)  DIAGNOSTIC STUDIES: Oxygen Saturation is 98% on room air, normal by my interpretation.    COORDINATION OF CARE: 12:25 PM- Will obtain a CT scan of the patient's abdomen and check blood work.  The patient agreed to the treatment plan.   Labs Review Labs Reviewed  URINALYSIS, ROUTINE W  REFLEX MICROSCOPIC - Abnormal; Notable for the following:    Specific Gravity, Urine <1.005 (*)    Hgb urine dipstick TRACE (*)    All other components within normal limits  CBC WITH DIFFERENTIAL/PLATELET - Abnormal; Notable for the following:    RDW 16.7 (*)    Eosinophils Relative 6 (*)    All other components within normal limits  COMPREHENSIVE METABOLIC PANEL - Abnormal; Notable for the following:    Alkaline Phosphatase 26 (*)    All other components within normal limits  URINE MICROSCOPIC-ADD ON - Abnormal; Notable for the following:    Squamous Epithelial / LPF MANY (*)    All  other components within normal limits  LIPASE, BLOOD  PREGNANCY, URINE    Imaging Review Ct Abdomen Pelvis W Contrast  11/24/2014   CLINICAL DATA:  Right lower abdominal pain, urinary frequency, burning urination for 1 day, history of C-section and tubal ligation.  EXAM: CT ABDOMEN AND PELVIS WITH CONTRAST  TECHNIQUE: Multidetector CT imaging of the abdomen and pelvis was performed using the standard protocol following bolus administration of intravenous contrast.  CONTRAST:  50mL OMNIPAQUE IOHEXOL 300 MG/ML SOLN, OMNIPAQUE IOHEXOL 300 MG/ML SOLN  COMPARISON:  12/03/2013  FINDINGS: Sagittal images of the spine shows minimal degenerative changes lower thoracic spine. The lung bases are unremarkable. Enhanced liver shows no focal mass. The patient is status postcholecystectomy. Pancreas spleen and adrenal glands are unremarkable. Enhanced kidneys are symmetrical in size. No hydronephrosis or hydroureter. There is a tiny cyst in midpole of the right kidney anterior aspect measures 5 mm.  No small bowel obstruction. No ascites or free air. No adenopathy. No aortic aneurysm. Normal appendix clearly visualized axial image 43. There is no pericecal inflammation. The terminal ileum is unremarkable. Small amount of nonspecific fluid is noted within uterus. There is a left ovarian hemorrhagic cyst measures 3.1 by 3 cm. On  the prior exam measures 3.3 x 3.2 cm. No pelvic free fluid is noted. The right ovary is unremarkable.  IMPRESSION: 1. No acute inflammatory process within abdomen or pelvis. 2. No hydronephrosis or hydroureter. 3. No pericecal inflammation.  Normal appendix clearly visualized. 4. Unremarkable uterus and right ovary. There is a left ovarian hemorrhagic cyst measures 3 x 3.1 cm. On the prior exam measures 3.3 x 3.2 cm.   Electronically Signed   By: Natasha Mead M.D.   On: 11/24/2014 15:52     EKG Interpretation None      MDM   Final diagnoses:  RLQ abdominal pain    No acute abdomen.  Preg test negative. Rx for cervicitis with Zithromax 1 g po and Rocephin 250 mg IM. Wet prep pending. CT abdomen pelvis show no acute findings. There is a left ovarian hemorrhagic cyst. Discharge medications Percocet and Phenergan 25 mg.  Disc c Dr Elesa Massed  I personally performed the services described in this documentation, which was scribed in my presence. The recorded information has been reviewed and is accurate.    Sophia Hutching, MD 11/24/14 1622  Sophia Hutching, MD 11/24/14 [phone]

## 2014-11-24 NOTE — ED Notes (Signed)
MD at bedside. 

## 2014-11-24 NOTE — ED Notes (Signed)
Patient given a Coke at this time.

## 2014-11-24 NOTE — Discharge Instructions (Signed)
CT scan showed no evidence of appendicitis.  Recommend clear liquids today.    You were found to have a yeast infection and possible infection of your cervix. We have given you both antibiotics and antifungals in the emergency department that will treat both of these conditions. You will not need to go home on further medication other than medicine for pain and nausea as needed. Please follow up with your primary care physician or OB/GYN if symptoms continue.   Candida Infection A Candida infection (also called yeast, fungus, and Monilia infection) is an overgrowth of yeast that can occur anywhere on the body. A yeast infection commonly occurs in warm, moist body areas. Usually, the infection remains localized but can spread to become a systemic infection. A yeast infection may be a sign of a more severe disease such as diabetes, leukemia, or AIDS. A yeast infection can occur in both men and women. In women, Candida vaginitis is a vaginal infection. It is one of the most common causes of vaginitis. Men usually do not have symptoms or know they have an infection until other problems develop. Men may find out they have a yeast infection because their sex partner has a yeast infection. Uncircumcised men are more likely to get a yeast infection than circumcised men. This is because the uncircumcised glans is not exposed to air and does not remain as dry as that of a circumcised glans. Older adults may develop yeast infections around dentures. CAUSES  Women  Antibiotics.  Steroid medication taken for a long time.  Being overweight (obese).  Diabetes.  Poor immune condition.  Certain serious medical conditions.  Immune suppressive medications for organ transplant patients.  Chemotherapy.  Pregnancy.  Menstruation.  Stress and fatigue.  Intravenous drug use.  Oral contraceptives.  Wearing tight-fitting clothes in the crotch area.  Catching it from a sex partner who has a yeast  infection.  Spermicide.  Intravenous, urinary, or other catheters. Men  Catching it from a sex partner who has a yeast infection.  Having oral or anal sex with a person who has the infection.  Spermicide.  Diabetes.  Antibiotics.  Poor immune system.  Medications that suppress the immune system.  Intravenous drug use.  Intravenous, urinary, or other catheters. SYMPTOMS  Women  Thick, white vaginal discharge.  Vaginal itching.  Redness and swelling in and around the vagina.  Irritation of the lips of the vagina and perineum.  Blisters on the vaginal lips and perineum.  Painful sexual intercourse.  Low blood sugar (hypoglycemia).  Painful urination.  Bladder infections.  Intestinal problems such as constipation, indigestion, bad breath, bloating, increase in gas, diarrhea, or loose stools. Men  Men may develop intestinal problems such as constipation, indigestion, bad breath, bloating, increase in gas, diarrhea, or loose stools.  Dry, cracked skin on the penis with itching or discomfort.  Jock itch.  Dry, flaky skin.  Athlete's foot.  Hypoglycemia. DIAGNOSIS  Women  A history and an exam are performed.  The discharge may be examined under a microscope.  A culture may be taken of the discharge. Men  A history and an exam are performed.  Any discharge from the penis or areas of cracked skin will be looked at under the microscope and cultured.  Stool samples may be cultured. TREATMENT  Women  Vaginal antifungal suppositories and creams.  Medicated creams to decrease irritation and itching on the outside of the vagina.  Warm compresses to the perineal area to decrease swelling and discomfort.  Oral antifungal medications.  Medicated vaginal suppositories or cream for repeated or recurrent infections.  Wash and dry the irritation areas before applying the cream.  Eating yogurt with Lactobacillus may help with prevention and  treatment.  Sometimes painting the vagina with gentian violet solution may help if creams and suppositories do not work. Men  Antifungal creams and oral antifungal medications.  Sometimes treatment must continue for 30 days after the symptoms go away to prevent recurrence. HOME CARE INSTRUCTIONS  Women  Use cotton underwear and avoid tight-fitting clothing.  Avoid colored, scented toilet paper and deodorant tampons or pads.  Do not douche.  Keep your diabetes under control.  Finish all the prescribed medications.  Keep your skin clean and dry.  Consume milk or yogurt with Lactobacillus-active culture regularly. If you get frequent yeast infections and think that is what the infection is, there are over-the-counter medications that you can get. If the infection does not show healing in 3 days, talk to your caregiver.  Tell your sex partner you have a yeast infection. Your partner may need treatment also, especially if your infection does not clear up or recurs. Men  Keep your skin clean and dry.  Keep your diabetes under control.  Finish all prescribed medications.  Tell your sex partner that you have a yeast infection so he or she can be treated if necessary. SEEK MEDICAL CARE IF:   Your symptoms do not clear up or worsen in one week after treatment.  You have an oral temperature above 102 F (38.9 C).  You have trouble swallowing or eating for a prolonged time.  You develop blisters on and around your vagina.  You develop vaginal bleeding and it is not your menstrual period.  You develop abdominal pain.  You develop intestinal problems as mentioned above.  You get weak or light-headed.  You have painful or increased urination.  You have pain during sexual intercourse. MAKE SURE YOU:   Understand these instructions.  Will watch your condition.  Will get help right away if you are not doing well or get worse. Document Released: 09/12/2004 Document  Revised: 12/20/2013 Document Reviewed: 12/25/2009 [facility] Patient Information 2015 [place], [place]. This information is not intended to replace advice given to you by your health care provider. Make sure you discuss any questions you have with your health care provider.    Possible Cervicitis Cervicitis is a soreness and swelling (inflammation) of the cervix. Your cervix is located at the bottom of your uterus. It opens up to the vagina. CAUSES   Sexually transmitted infections (STIs).   Allergic reaction.   Medicines or birth control devices that are put in the vagina.   Injury to the cervix.   Bacterial infections.  RISK FACTORS You are at greater risk if you:  Have unprotected sexual intercourse.  Have sexual intercourse with many partners.  Began sexual intercourse at an early age.  Have a history of STIs. SYMPTOMS  There may be no symptoms. If symptoms occur, they may include:   Gray, white, yellow, or bad-smelling vaginal discharge.   Pain or itching of the area outside the vagina.   Painful sexual intercourse.   Lower abdominal or lower back pain, especially during intercourse.   Frequent urination.   Abnormal vaginal bleeding between periods, after sexual intercourse, or after menopause.   Pressure or a heavy feeling in the pelvis.  DIAGNOSIS  Diagnosis is made after a pelvic exam. Other tests may include:   Examination of any  discharge under a microscope (wet prep).   A Pap test.  TREATMENT  Treatment will depend on the cause of cervicitis. If it is caused by an STI, both you and your partner will need to be treated. Antibiotic medicines will be given.  HOME CARE INSTRUCTIONS   Do not have sexual intercourse until your health care provider says it is okay.   Do not have sexual intercourse until your partner has been treated, if your cervicitis is caused by an STI.   Take your antibiotics as directed. Finish them even if you start to  feel better.  SEEK MEDICAL CARE IF:  Your symptoms come back.   You have a fever.  MAKE SURE YOU:   Understand these instructions.  Will watch your condition.  Will get help right away if you are not doing well or get worse. Document Released: 08/05/2005 Document Revised: 08/10/2013 Document Reviewed: 01/27/2013 [facility] Patient Information 2015 [place], [place]. This information is not intended to replace advice given to you by your health care provider. Make sure you discuss any questions you have with your health care provider.

## 2014-11-25 LAB — GC/CHLAMYDIA PROBE AMP (~~LOC~~) NOT AT ARMC
CHLAMYDIA, DNA PROBE: NEGATIVE
Neisseria Gonorrhea: NEGATIVE

## 2015-05-13 ENCOUNTER — Emergency Department (HOSPITAL_COMMUNITY): Payer: Self-pay

## 2015-05-13 ENCOUNTER — Encounter (HOSPITAL_COMMUNITY): Payer: Self-pay | Admitting: Emergency Medicine

## 2015-05-13 ENCOUNTER — Emergency Department (HOSPITAL_COMMUNITY)
Admission: EM | Admit: 2015-05-13 | Discharge: 2015-05-13 | Disposition: A | Discharge status: Home / Self Care | Payer: Self-pay | Attending: Emergency Medicine | Admitting: Emergency Medicine

## 2015-05-13 DIAGNOSIS — Z8619 Personal history of other infectious and parasitic diseases: Secondary | ICD-10-CM | POA: Insufficient documentation

## 2015-05-13 DIAGNOSIS — Z7951 Long term (current) use of inhaled steroids: Secondary | ICD-10-CM | POA: Insufficient documentation

## 2015-05-13 DIAGNOSIS — Z91040 Latex allergy status: Secondary | ICD-10-CM | POA: Insufficient documentation

## 2015-05-13 DIAGNOSIS — Z791 Long term (current) use of non-steroidal anti-inflammatories (NSAID): Secondary | ICD-10-CM | POA: Insufficient documentation

## 2015-05-13 DIAGNOSIS — Z792 Long term (current) use of antibiotics: Secondary | ICD-10-CM | POA: Insufficient documentation

## 2015-05-13 DIAGNOSIS — Z8742 Personal history of other diseases of the female genital tract: Secondary | ICD-10-CM | POA: Insufficient documentation

## 2015-05-13 DIAGNOSIS — X58XXXA Exposure to other specified factors, initial encounter: Secondary | ICD-10-CM | POA: Insufficient documentation

## 2015-05-13 DIAGNOSIS — Y998 Other external cause status: Secondary | ICD-10-CM | POA: Insufficient documentation

## 2015-05-13 DIAGNOSIS — S62629A Displaced fracture of medial phalanx of unspecified finger, initial encounter for closed fracture: Secondary | ICD-10-CM

## 2015-05-13 DIAGNOSIS — Z720 Tobacco use: Secondary | ICD-10-CM | POA: Insufficient documentation

## 2015-05-13 DIAGNOSIS — S62632A Displaced fracture of distal phalanx of right middle finger, initial encounter for closed fracture: Secondary | ICD-10-CM | POA: Insufficient documentation

## 2015-05-13 DIAGNOSIS — Y9389 Activity, other specified: Secondary | ICD-10-CM | POA: Insufficient documentation

## 2015-05-13 DIAGNOSIS — Z79899 Other long term (current) drug therapy: Secondary | ICD-10-CM | POA: Insufficient documentation

## 2015-05-13 DIAGNOSIS — Y9289 Other specified places as the place of occurrence of the external cause: Secondary | ICD-10-CM | POA: Insufficient documentation

## 2015-05-13 MED ORDER — IBUPROFEN 600 MG PO TABS
600.0000 mg | ORAL_TABLET | Freq: Four times a day (QID) | ORAL | Status: DC | PRN
Start: 2015-05-13 — End: 2015-07-22

## 2015-05-13 MED ORDER — OXYCODONE-ACETAMINOPHEN 5-325 MG PO TABS
1.0000 | ORAL_TABLET | Freq: Once | ORAL | Status: AC
  Administered 2015-05-13: 1 via ORAL
  Filled 2015-05-13: qty 1

## 2015-05-13 MED ORDER — OXYCODONE-ACETAMINOPHEN 5-325 MG PO TABS: 1.0000 | ORAL_TABLET | Freq: Four times a day (QID) | ORAL | Status: DC | PRN

## 2015-05-13 MED ORDER — IBUPROFEN 400 MG PO TABS
400.0000 mg | ORAL_TABLET | Freq: Once | ORAL | Status: AC
  Administered 2015-05-13: 400 mg via ORAL
  Filled 2015-05-13: qty 1

## 2015-05-13 MED ORDER — ONDANSETRON HCL 4 MG PO TABS
4.0000 mg | ORAL_TABLET | Freq: Once | ORAL | Status: AC
  Administered 2015-05-13: 4 mg via ORAL
  Filled 2015-05-13: qty 1

## 2015-05-13 NOTE — ED Provider Notes (Signed)
CSN: [id number]     Arrival date & time 05/13/15  2046 History   First MD Initiated Contact with Patient 05/13/15 2052     Chief Complaint  Patient presents with  . Finger Injury     (Consider location/radiation/quality/duration/timing/severity/associated sxs/prior Treatment) HPI Comments: Patient is a 29 year old female who presents to the emergency department with complaint of injury to the right middle finger. The patient states that earlier this evening on 2 occasion she was trying to restrain her dog. She thinks the chain may have gotten wrapped around her middle finger accidentally, and she sustained injury to the finger. She noticed throbbing shooting pain. She noted swelling of the finger, this would not resolve with conservative measures, and she came to the emergency department for evaluation. The patient denies being on any anticoagulation medications. She has not had any previous operations or procedures involving the right hand.  The history is provided by the patient.    Past Medical History  Diagnosis Date  . Yeast infection   . BV (bacterial vaginosis)   . Normal pregnancy, incidental 12/23/2011  . Preterm labor   . HPV (human papilloma virus) infection    Past Surgical History  Procedure Laterality Date  . Cesarean section    . Tooth extraction  2009    ONE TOOTH  . Foot surgery  2002    STEPPED ON SEWING NEEDLE  . Cesarean section  06/12/2012    Procedure: CESAREAN SECTION;  Surgeon: Kirkland Hun, MD;  Location: [facility];  Service: Obstetrics;  Laterality: N/A;  Repeat  . Tubal ligation    . Cholecystectomy N/A 01/03/2014    Procedure: LAPAROSCOPIC CHOLECYSTECTOMY;  Surgeon: Dalia Heading, MD;  Location: [facility];  Service: General;  Laterality: N/A;   Family History  Problem Relation Age of Onset  . Diabetes Father     ORAL MEDS  . Cancer Paternal Aunt     BREAST;LIVER  . Diabetes Paternal Aunt   . Diabetes Paternal Uncle   . COPD Paternal Uncle   . Cancer  Maternal Grandmother     BREAST  . COPD Mother     EMPHYSEMA  . Diabetes Paternal Grandfather   . Other Neg Hx    Social History  Substance Use Topics  . Smoking status: Current Every Day Smoker -- 0.50 packs/day for 12 years    Types: Cigarettes  . Smokeless tobacco: Never Used     Comment: 1 pack lasts 4 days  . Alcohol Use: No   OB History    Gravida Para Term Preterm AB TAB SAB Ectopic Multiple Living   Review of Systems  All other systems reviewed and are negative.     Allergies  Latex; Baby oil; and Morphine and related  Home Medications   Prior to Admission medications   Medication Sig Start Date End Date Taking? Authorizing Provider  amoxicillin (AMOXIL) 500 MG capsule Take 1 capsule (500 mg total) by mouth 3 (three) times daily. 11/19/14   Hope Orlene Och, NP  HYDROcodone-acetaminophen (HYCET) 7.5-325 mg/15 ml solution Take 15 mLs by mouth every 6 (six) hours as needed for moderate pain (and cough). Patient not taking: Reported on 11/24/2014 04/03/14   Joni Reining Pisciotta, PA-C  loratadine (CLARITIN) 10 MG tablet Take 1 tablet (10 mg total) by mouth daily. 04/10/14   Loren Racer, MD  mometasone (NASONEX) 50 MCG/ACT nasal spray Place 2 sprays into the nose  daily. 04/10/14   Loren Racer, MD  naproxen (NAPROSYN) 500 MG tablet Take 1 tablet (500 mg total) by mouth 2 (two) times daily. 11/19/14   Hope Orlene Och, NP  oxyCODONE-acetaminophen (PERCOCET) 5-325 MG per tablet Take 1-2 tablets by mouth every 4 (four) hours as needed. 11/24/14   Donnetta Hutching, MD  oxymetazoline (AFRIN NASAL SPRAY) 0.05 % nasal spray Place 1 spray into both nostrils 2 (two) times daily. 04/10/14   Loren Racer, MD  predniSONE (DELTASONE) 20 MG tablet Take 2 tablets (40 mg total) by mouth daily. Patient not taking: Reported on 11/24/2014 04/03/14   Joni Reining Pisciotta, PA-C  promethazine (PHENERGAN) 25 MG tablet Take 1 tablet (25 mg total) by mouth every 6 (six) hours as needed. 11/24/14   Donnetta Hutching, MD   BP 108/77 mmHg  Pulse 88  Temp(Src) 98.6 F (37 C) (Oral)  Resp 20  Ht  (1.499 m)  Wt 95 lb (43.092 kg)  BMI 19.18 kg/m2  SpO2 100%  LMP 04/23/2015 Physical Exam  Constitutional: She is oriented to person, place, and time. She appears well-developed and well-nourished.  Non-toxic appearance.  HENT:  Head: Normocephalic.  Right Ear: Tympanic membrane and external ear normal.  Left Ear: Tympanic membrane and external ear normal.  Eyes: EOM and lids are normal. Pupils are equal, round, and reactive to light.  Neck: Normal range of motion. Neck supple. Carotid bruit is not present.  Cardiovascular: Normal rate, regular rhythm, normal heart sounds, intact distal pulses and normal pulses.   Pulmonary/Chest: Breath sounds normal. No respiratory distress.  Abdominal: Soft. Bowel sounds are normal. There is no tenderness. There is no guarding.  Musculoskeletal:       Right hand: She exhibits decreased range of motion, tenderness and swelling. She exhibits normal capillary refill and no laceration. Normal sensation noted.       Hands: Lymphadenopathy:       Head (right side): No submandibular adenopathy present.       Head (left side): No submandibular adenopathy present.    She has no cervical adenopathy.  Neurological: She is alert and oriented to person, place, and time. She has normal strength. No cranial nerve deficit or sensory deficit.  Skin: Skin is warm and dry.  Psychiatric: She has a normal mood and affect. Her speech is normal.  Nursing note and vitals reviewed.   ED Course  NERVE BLOCK Date/Time: 05/13/2015 9:29 PM Performed by: Ivery Quale Authorized by: Ivery Quale Consent: Verbal consent obtained. Risks and benefits: risks, benefits and alternatives were discussed Consent given by: patient Patient understanding: patient states understanding of the procedure being performed Patient identity confirmed: arm band Time out: Immediately prior to  procedure a "time out" was called to verify the correct patient, procedure, equipment, support staff and site/side marked as required. Indications: pain relief Body area: upper extremity Nerve: digital Laterality: right Patient sedated: no Preparation: Patient was prepped and draped in the usual sterile fashion. Patient position: sitting Needle gauge: 30G. Location technique: anatomical landmarks Local anesthetic: bupivacaine 0.25% without epinephrine Outcome: pain improved Patient tolerance: Patient tolerated the procedure well with no immediate complications   FRACTURE CARE RIGHT MIDDLE FINGER  X-ray of the fingers suggests fracture of the distal aspect of the third middle phalanx. The fracture was explained to the patient in terms which he understands. The procedure was explained in terms which the patient understands. The patient gives permission for the procedure. Patient identified by arm band. Procedural time out taken. Good  capillary refill, color and temperature evaluated prior to procedure. Following this a metal finger splint was applied without problem. Ice pack was applied, as well as pain medication and digital block. The patient tolerated the procedure without problem.  (including critical care time) Labs Review Labs Reviewed - No data to display  Imaging Review No results found. I have personally reviewed and evaluated these images and lab results as part of my medical decision-making.   EKG Interpretation None      MDM  Vital signs reviewed. Patient sustained a fracture to the distal aspect of the third middle phalanx. Patient has been splinted. Pain medicine ordered. Patient referred to Dr. Orlan Leavens for orthopedic management. Patient is in agreement with this discharge plan.    Final diagnoses:  None    **I have reviewed nursing notes, vital signs, and all appropriate lab and imaging results for this patient.Ivery Quale, PA-C 05/13/15 2207  Donnetta Hutching, MD 05/13/15 2213

## 2015-05-13 NOTE — ED Notes (Signed)
Ice pack applied to right hand,

## 2015-05-13 NOTE — ED Notes (Signed)
Pt injured her R middle finger trying to restrain her dog. Edema and bruising noted to middle joint.

## 2015-05-13 NOTE — Discharge Instructions (Signed)
You have a fracture of the right middle finger. Please use the splint until seen by the hand specialist. Please keep your hand elevated above your heart as much as possible. Use an ice pack a Finger Fracture A finger fracture is when one or more bones in the finger break.  HOME CARE   Wear the splint, tape, or cast as long as told by your doctor.  Keep your fingers in the position your doctor tell you to.  Raise (elevate) the injured area above the level of the heart.  Only take medicine as told by your doctor.  Put ice on the injured area.  Put ice in a plastic bag.  Place a towel between the skin and the bag.  Leave the ice on for 15-20 minutes, 03-04 times a day.  Follow up with your doctor.  Ask what exercises you can do when the splint comes off. GET HELP RIGHT AWAY IF:   The fingernails are white or bluish.  You have pain not helped by medicine.  You cannot move your fingertips.  You lose feeling (numbness) in the injured finger(s). MAKE SURE YOU:   Understand these instructions.  Will watch this condition.  Will get help right away if you are not doing well or get worse. Document Released: 01/22/2008 Document Revised: 10/28/2011 Document Reviewed: 01/22/2008 [facility] Patient Information 2015 [place], [place]. This information is not intended to replace advice given to you by your health care provider. Make sure you discuss any questions you have with your health care provider. s much as possible to improve swelling. Please use Percocet and one ibuprofen tablet every 6 hours for pain. Percocet may cause drowsiness, and/or constipation. Please use Percocet with caution.

## 2015-05-13 NOTE — ED Notes (Signed)
Hobson PA at bedside,  

## 2015-07-22 ENCOUNTER — Emergency Department (HOSPITAL_COMMUNITY)
Admission: EM | Admit: 2015-07-22 | Discharge: 2015-07-22 | Disposition: A | Discharge status: Home / Self Care | Payer: Self-pay | Attending: Emergency Medicine | Admitting: Emergency Medicine

## 2015-07-22 ENCOUNTER — Encounter (HOSPITAL_COMMUNITY): Payer: Self-pay | Admitting: Emergency Medicine

## 2015-07-22 DIAGNOSIS — Z791 Long term (current) use of non-steroidal anti-inflammatories (NSAID): Secondary | ICD-10-CM | POA: Insufficient documentation

## 2015-07-22 DIAGNOSIS — H9203 Otalgia, bilateral: Secondary | ICD-10-CM | POA: Insufficient documentation

## 2015-07-22 DIAGNOSIS — F17210 Nicotine dependence, cigarettes, uncomplicated: Secondary | ICD-10-CM | POA: Insufficient documentation

## 2015-07-22 DIAGNOSIS — Z79899 Other long term (current) drug therapy: Secondary | ICD-10-CM | POA: Insufficient documentation

## 2015-07-22 DIAGNOSIS — Z91040 Latex allergy status: Secondary | ICD-10-CM | POA: Insufficient documentation

## 2015-07-22 DIAGNOSIS — Z8742 Personal history of other diseases of the female genital tract: Secondary | ICD-10-CM | POA: Insufficient documentation

## 2015-07-22 DIAGNOSIS — J029 Acute pharyngitis, unspecified: Secondary | ICD-10-CM

## 2015-07-22 DIAGNOSIS — Z792 Long term (current) use of antibiotics: Secondary | ICD-10-CM | POA: Insufficient documentation

## 2015-07-22 DIAGNOSIS — Z7951 Long term (current) use of inhaled steroids: Secondary | ICD-10-CM | POA: Insufficient documentation

## 2015-07-22 DIAGNOSIS — J069 Acute upper respiratory infection, unspecified: Secondary | ICD-10-CM | POA: Insufficient documentation

## 2015-07-22 DIAGNOSIS — H61899 Other specified disorders of external ear, unspecified ear: Secondary | ICD-10-CM | POA: Insufficient documentation

## 2015-07-22 DIAGNOSIS — Z8619 Personal history of other infectious and parasitic diseases: Secondary | ICD-10-CM | POA: Insufficient documentation

## 2015-07-22 MED ORDER — CETIRIZINE-PSEUDOEPHEDRINE ER 5-120 MG PO TB12: 1.0000 | ORAL_TABLET | Freq: Two times a day (BID) | ORAL | Status: DC

## 2015-07-22 MED ORDER — IBUPROFEN 600 MG PO TABS: 600.0000 mg | ORAL_TABLET | Freq: Four times a day (QID) | ORAL | Status: DC | PRN

## 2015-07-22 MED ORDER — IBUPROFEN 800 MG PO TABS
800.0000 mg | ORAL_TABLET | Freq: Once | ORAL | Status: AC
  Administered 2015-07-22: 800 mg via ORAL
  Filled 2015-07-22: qty 1

## 2015-07-22 NOTE — Discharge Instructions (Signed)
Upper Respiratory Infection, Adult °Most upper respiratory infections (URIs) are a viral infection of the air passages leading to the lungs. A URI affects the nose, throat, and upper air passages. The most common type of URI is nasopharyngitis and is typically referred to as "the common cold." °URIs run their course and usually go away on their own. Most of the time, a URI does not require medical attention, but sometimes a bacterial infection in the upper airways can follow a viral infection. This is called a secondary infection. Sinus and middle ear infections are common types of secondary upper respiratory infections. °Bacterial pneumonia can also complicate a URI. A URI can worsen asthma and chronic obstructive pulmonary disease (COPD). Sometimes, these complications can require emergency medical care and may be life threatening.  °CAUSES °Almost all URIs are caused by viruses. A virus is a type of germ and can spread from one person to another.  °RISKS FACTORS °You may be at risk for a URI if:  °· You smoke.   °· You have chronic heart or lung disease. °· You have a weakened defense (immune) system.   °· You are very young or very old.   °· You have nasal allergies or asthma. °· You work in crowded or poorly ventilated areas. °· You work in health care facilities or schools. °SIGNS AND SYMPTOMS  °Symptoms typically develop 2-3 days after you come in contact with a cold virus. Most viral URIs last 7-10 days. However, viral URIs from the influenza virus (flu virus) can last 14-18 days and are typically more severe. Symptoms may include:  °· Runny or stuffy (congested) nose.   °· Sneezing.   °· Cough.   °· Sore throat.   °· Headache.   °· Fatigue.   °· Fever.   °· Loss of appetite.   °· Pain in your forehead, behind your eyes, and over your cheekbones (sinus pain). °· Muscle aches.   °DIAGNOSIS  °Your health care provider may diagnose a URI by: °· Physical exam. °· Tests to check that your symptoms are not due to  another condition such as: °· Strep throat. °· Sinusitis. °· Pneumonia. °· Asthma. °TREATMENT  °A URI goes away on its own with time. It cannot be cured with medicines, but medicines may be prescribed or recommended to relieve symptoms. Medicines may help: °· Reduce your fever. °· Reduce your cough. °· Relieve nasal congestion. °HOME CARE INSTRUCTIONS  °· Take medicines only as directed by your health care provider.   °· Gargle warm saltwater or take cough drops to comfort your throat as directed by your health care provider. °· Use a warm mist humidifier or inhale steam from a shower to increase air moisture. This may make it easier to breathe. °· Drink enough fluid to keep your urine clear or pale yellow.   °· Eat soups and other clear broths and maintain good nutrition.   °· Rest as needed.   °· Return to work when your temperature has returned to normal or as your health care provider advises. You may need to stay home longer to avoid infecting others. You can also use a face mask and careful hand washing to prevent spread of the virus. °· Increase the usage of your inhaler if you have asthma.   °· Do not use any tobacco products, including cigarettes, chewing tobacco, or electronic cigarettes. If you need help quitting, ask your health care provider. °PREVENTION  °The best way to protect yourself from getting a cold is to practice good hygiene.  °· Avoid oral or hand contact with people with cold   symptoms.   Wash your hands often if contact occurs.  There is no clear evidence that vitamin C, vitamin E, echinacea, or exercise reduces the chance of developing a cold. However, it is always recommended to get plenty of rest, exercise, and practice good nutrition.  SEEK MEDICAL CARE IF:   You are getting worse rather than better.   Your symptoms are not controlled by medicine.   You have chills.  You have worsening shortness of breath.  You have brown or red mucus.  You have yellow or brown nasal  discharge.  You have pain in your face, especially when you bend forward.  You have a fever.  You have swollen neck glands.  You have pain while swallowing.  You have white areas in the back of your throat. SEEK IMMEDIATE MEDICAL CARE IF:   You have severe or persistent:  Headache.  Ear pain.  Sinus pain.  Chest pain.  You have chronic lung disease and any of the following:  Wheezing.  Prolonged cough.  Coughing up blood.  A change in your usual mucus.  You have a stiff neck.  You have changes in your:  Vision.  Hearing.  Thinking.  Mood. MAKE SURE YOU:   Understand these instructions.  Will watch your condition.  Will get help right away if you are not doing well or get worse.   This information is not intended to replace advice given to you by your health care provider. Make sure you discuss any questions you have with your health care provider.   Document Released: 01/29/2001 Document Revised: 12/20/2014 Document Reviewed: 11/10/2013 Elsevier Interactive Patient Education 2016 [employer]  Sore Throat A sore throat is a painful, burning, sore, or scratchy feeling of the throat. There may be pain or tenderness when swallowing or talking. You may have other symptoms with a sore throat. These include coughing, sneezing, fever, or a swollen neck. A sore throat is often the first sign of another sickness. These sicknesses may include a cold, flu, strep throat, or an infection called mono. Most sore throats go away without medical treatment.  HOME CARE   Only take medicine as told by your doctor.  Drink enough fluids to keep your pee (urine) clear or pale yellow.  Rest as needed.  Try using throat sprays, lozenges, or suck on hard candy (if older than 4 years or as told).  Sip warm liquids, such as broth, herbal tea, or warm water with honey. Try sucking on frozen ice pops or drinking cold liquids.  Rinse the mouth (gargle) with salt water. Mix 1  teaspoon salt with 8 ounces of water.  Do not smoke. Avoid being around others when they are smoking.  Put a humidifier in your bedroom at night to moisten the air. You can also turn on a hot shower and sit in the bathroom for 5-10 minutes. Be sure the bathroom door is closed. GET HELP RIGHT AWAY IF:   You have trouble breathing.  You cannot swallow fluids, soft foods, or your spit (saliva).  You have more puffiness (swelling) in the throat.  Your sore throat does not get better in 7 days.  You feel sick to your stomach (nauseous) and throw up (vomit).  You have a fever or lasting symptoms for more than 2-3 days.  You have a fever and your symptoms suddenly get worse. MAKE SURE YOU:   Understand these instructions.  Will watch your condition.  Will get help right away if you  are not doing well or get worse.   This information is not intended to replace advice given to you by your health care provider. Make sure you discuss any questions you have with your health care provider.   Document Released: 05/14/2008 Document Revised: 04/29/2012 Document Reviewed: 04/12/2012 Elsevier Interactive Patient Education [employer]2016 Elsevier Inc.

## 2015-07-22 NOTE — ED Provider Notes (Signed)
CSN: [id number]     Arrival date & time 07/22/15  0935 History   First MD Initiated Contact with Patient 07/22/15 [id number]     Chief Complaint  Patient presents with  . Sore Throat     (Consider location/radiation/quality/duration/timing/severity/associated sxs/prior Treatment) The history is provided by the patient.   Sophia Garcia is a 29 y.o. female presenting with 2 day history of uri type symptoms which includes nasal congestion with clear rhinorrhea, sore throat, bilateral ear pain and non productive cough.  Symptoms due to not include shortness of breath, chest pain,  Nausea, vomiting or diarrhea.  The patient has had no medication prior to arrival for symptom relief.  Her throat pain was worsened this morning describing sharp pain with swallowing.  She denies any exposures to strep throat.     Past Medical History  Diagnosis Date  . Yeast infection   . BV (bacterial vaginosis)   . Normal pregnancy, incidental 12/23/2011  . Preterm labor   . HPV (human papilloma virus) infection    Past Surgical History  Procedure Laterality Date  . Cesarean section    . Tooth extraction  2009    ONE TOOTH  . Foot surgery  2002    STEPPED ON SEWING NEEDLE  . Cesarean section  06/12/2012    Procedure: CESAREAN SECTION;  Surgeon: Kirkland HunArthur Stringer, MD;  Location: [facility];  Service: Obstetrics;  Laterality: N/A;  Repeat  . Tubal ligation    . Cholecystectomy N/A 01/03/2014    Procedure: LAPAROSCOPIC CHOLECYSTECTOMY;  Surgeon: Dalia HeadingMark A Jenkins, MD;  Location: [facility];  Service: General;  Laterality: N/A;   Family History  Problem Relation Age of Onset  . Diabetes Father     ORAL MEDS  . Cancer Paternal Aunt     BREAST;LIVER  . Diabetes Paternal Aunt   . Diabetes Paternal Uncle   . COPD Paternal Uncle   . Cancer Maternal Grandmother     BREAST  . COPD Mother     EMPHYSEMA  . Diabetes Paternal Grandfather   . Other Neg Hx    Social History  Substance Use Topics  . Smoking status: Current  Every Day Smoker -- 0.50 packs/day for 12 years    Types: Cigarettes  . Smokeless tobacco: Never Used     Comment: 1 pack lasts 4 days  . Alcohol Use: No   OB History    Gravida Para Term Preterm AB TAB SAB Ectopic Multiple Living   2 2 2       2      Review of Systems  Constitutional: Negative for fever and chills.  HENT: Positive for congestion, ear pain, postnasal drip, rhinorrhea and sore throat. Negative for sinus pressure, trouble swallowing and voice change.   Eyes: Negative for discharge.  Respiratory: Positive for cough. Negative for shortness of breath, wheezing and stridor.   Cardiovascular: Negative for chest pain.  Gastrointestinal: Negative for abdominal pain.  Genitourinary: Negative.       Allergies  Latex; Baby oil; and Morphine and related  Home Medications   Prior to Admission medications   Medication Sig Start Date End Date Taking? Authorizing Provider  amoxicillin (AMOXIL) 500 MG capsule Take 1 capsule (500 mg total) by mouth 3 (three) times daily. 11/19/14   Hope Orlene OchM Neese, NP  cetirizine-pseudoephedrine (ZYRTEC-D) 5-120 MG tablet Take 1 tablet by mouth 2 (two) times daily. 07/22/15   Burgess AmorJulie Derak Schurman, PA-C  HYDROcodone-acetaminophen (HYCET) 7.5-325 mg/15 ml solution Take 15 mLs by mouth  every 6 (six) hours as needed for moderate pain (and cough). Patient not taking: Reported on 11/24/2014 04/03/14   Joni Reining Pisciotta, PA-C  ibuprofen (ADVIL,MOTRIN) 600 MG tablet Take 1 tablet (600 mg total) by mouth every 6 (six) hours as needed. 07/22/15   Burgess Amor, PA-C  loratadine (CLARITIN) 10 MG tablet Take 1 tablet (10 mg total) by mouth daily. 04/10/14   Loren Racer, MD  mometasone (NASONEX) 50 MCG/ACT nasal spray Place 2 sprays into the nose daily. 04/10/14   Loren Racer, MD  naproxen (NAPROSYN) 500 MG tablet Take 1 tablet (500 mg total) by mouth 2 (two) times daily. 11/19/14   Hope Orlene Och, NP  oxyCODONE-acetaminophen (PERCOCET/ROXICET) 5-325 MG per tablet Take 1 tablet by  mouth every 6 (six) hours as needed. 05/13/15   Ivery Quale, PA-C  oxymetazoline (AFRIN NASAL SPRAY) 0.05 % nasal spray Place 1 spray into both nostrils 2 (two) times daily. 04/10/14   Loren Racer, MD  predniSONE (DELTASONE) 20 MG tablet Take 2 tablets (40 mg total) by mouth daily. Patient not taking: Reported on 11/24/2014 04/03/14   Joni Reining Pisciotta, PA-C  promethazine (PHENERGAN) 25 MG tablet Take 1 tablet (25 mg total) by mouth every 6 (six) hours as needed. 11/24/14   Donnetta Hutching, MD   BP 124/78 mmHg  Pulse 96  Temp(Src) 98.2 F (36.8 C) (Oral)  Resp 18  Ht  (1.499 m)  Wt 43.092 kg  BMI 19.18 kg/m2  SpO2 100%  LMP 07/20/2015 Physical Exam  Constitutional: She is oriented to person, place, and time. She appears well-developed and well-nourished.  HENT:  Head: Normocephalic and atraumatic.  Right Ear: Ear canal normal. Tympanic membrane is retracted. Tympanic membrane is not erythematous. No middle ear effusion.  Left Ear: Ear canal normal. Tympanic membrane is retracted. Tympanic membrane is not erythematous.  No middle ear effusion.  Nose: Mucosal edema and rhinorrhea present.  Mouth/Throat: Uvula is midline, oropharynx is clear and moist and mucous membranes are normal. No oropharyngeal exudate, posterior oropharyngeal edema, posterior oropharyngeal erythema or tonsillar abscesses.  Mild erythema along bilateral soft pillars.  No edema, no exudate.  No tonsillar hypertrophy  Eyes: Conjunctivae are normal.  Neck: Normal range of motion. Neck supple.  Cardiovascular: Normal rate and normal heart sounds.   Pulmonary/Chest: Effort normal. No respiratory distress. She has no wheezes. She has no rales.  Musculoskeletal: Normal range of motion.  Lymphadenopathy:    She has no cervical adenopathy.  Neurological: She is alert and oriented to person, place, and time.  Skin: Skin is warm and dry. No rash noted.  Psychiatric: She has a normal mood and affect.    ED Course   Procedures (including critical care time) Labs Review Labs Reviewed - No data to display  Imaging Review No results found. I have personally reviewed and evaluated these images and lab results as part of my medical decision-making.   EKG Interpretation None      MDM   Final diagnoses:  Pharyngitis  Acute URI    Pt prescribed motrin, zyrtec d.  Advised rest, increased fluid intake.  Exam c/w viral uri.  No respiratory distress. No findings to suggest strep infection.     Burgess Amor, PA-C 07/22/15 1027  Blane Ohara, MD 07/22/15 [phone]

## 2015-07-22 NOTE — ED Notes (Signed)
Pt states sore throat x 2 days. States bilateral ear pain and nasal congestion noticed this  Morning. NAD. Slight redness to right side of the throat.

## 2015-07-22 NOTE — ED Notes (Signed)
Pt states that she has had a sore throat and bilateral ear pain for 2 days.

## 2015-10-29 ENCOUNTER — Emergency Department (HOSPITAL_COMMUNITY)
Admission: EM | Admit: 2015-10-29 | Discharge: 2015-10-29 | Disposition: A | Discharge status: Home / Self Care | Payer: Self-pay | Attending: Emergency Medicine | Admitting: Emergency Medicine

## 2015-10-29 ENCOUNTER — Encounter (HOSPITAL_COMMUNITY): Payer: Self-pay | Admitting: Emergency Medicine

## 2015-10-29 DIAGNOSIS — R102 Pelvic and perineal pain: Secondary | ICD-10-CM | POA: Insufficient documentation

## 2015-10-29 DIAGNOSIS — Z9851 Tubal ligation status: Secondary | ICD-10-CM | POA: Insufficient documentation

## 2015-10-29 DIAGNOSIS — R112 Nausea with vomiting, unspecified: Secondary | ICD-10-CM | POA: Insufficient documentation

## 2015-10-29 DIAGNOSIS — Z9049 Acquired absence of other specified parts of digestive tract: Secondary | ICD-10-CM | POA: Insufficient documentation

## 2015-10-29 DIAGNOSIS — F17210 Nicotine dependence, cigarettes, uncomplicated: Secondary | ICD-10-CM | POA: Insufficient documentation

## 2015-10-29 DIAGNOSIS — Z79899 Other long term (current) drug therapy: Secondary | ICD-10-CM | POA: Insufficient documentation

## 2015-10-29 LAB — CBC WITH DIFFERENTIAL/PLATELET
BASOS PCT: 0 %
Basophils Absolute: 0 10*3/uL (ref 0.0–0.1)
EOS PCT: 3 %
Eosinophils Absolute: 0.3 10*3/uL (ref 0.0–0.7)
HCT: 38.5 % (ref 36.0–46.0)
Hemoglobin: 12.6 g/dL (ref 12.0–15.0)
LYMPHS PCT: 9 %
Lymphs Abs: 1.1 10*3/uL (ref 0.7–4.0)
MCH: 29.4 pg (ref 26.0–34.0)
MCHC: 32.7 g/dL (ref 30.0–36.0)
MCV: 89.7 fL (ref 78.0–100.0)
Monocytes Absolute: 0.7 10*3/uL (ref 0.1–1.0)
Monocytes Relative: 5 %
NEUTROS PCT: 83 %
Neutro Abs: 10.5 10*3/uL — ABNORMAL HIGH (ref 1.7–7.7)
PLATELETS: 349 10*3/uL (ref 150–400)
RBC: 4.29 MIL/uL (ref 3.87–5.11)
RDW: 15.4 % (ref 11.5–15.5)
WBC: 12.5 10*3/uL — AB (ref 4.0–10.5)

## 2015-10-29 MED ORDER — HYDROCODONE-ACETAMINOPHEN 5-325 MG PO TABS: 2.0000 | ORAL_TABLET | ORAL | Status: DC | PRN

## 2015-10-29 MED ORDER — HYDROCODONE-ACETAMINOPHEN 5-325 MG PO TABS
2.0000 | ORAL_TABLET | Freq: Once | ORAL | Status: AC
  Administered 2015-10-29: 2 via ORAL
  Filled 2015-10-29: qty 2

## 2015-10-29 NOTE — ED Provider Notes (Signed)
CSN: [id number]     Arrival date & time 10/29/15  2013 History   First MD Initiated Contact with Patient 10/29/15 2015     Chief Complaint  Patient presents with  . Abdominal Pain     (Consider location/radiation/quality/duration/timing/severity/associated sxs/prior Treatment) HPI   Sophia Garcia is a 30 y.o. female who presents for evaluation of pelvic pain, onset during sexual intercourse, usual activity for her, tonight. She had to stop having intercourse immediately. The pain persisted. There was no associated vaginal discharge or bleeding. She had a short episode of nausea with vomiting after onset of the pain. She is status post tubal ligation. Last menstrual period was 10/08/2015. She does have occasional episodes of pelvic pain during sexual intercourse, since her tubal ligation 4 years ago. She denies recent fever, chills, nausea, vomiting, urinary problems, or constipation. There are no other known modifying factors.   Past Medical History  Diagnosis Date  . Yeast infection   . BV (bacterial vaginosis)   . Normal pregnancy, incidental 12/23/2011  . Preterm labor   . HPV (human papilloma virus) infection    Past Surgical History  Procedure Laterality Date  . Cesarean section    . Tooth extraction  2009    ONE TOOTH  . Foot surgery  2002    STEPPED ON SEWING NEEDLE  . Cesarean section  06/12/2012    Procedure: CESAREAN SECTION;  Surgeon: Kirkland Hun, MD;  Location: [facility];  Service: Obstetrics;  Laterality: N/A;  Repeat  . Tubal ligation    . Cholecystectomy N/A 01/03/2014    Procedure: LAPAROSCOPIC CHOLECYSTECTOMY;  Surgeon: Dalia Heading, MD;  Location: [facility];  Service: General;  Laterality: N/A;   Family History  Problem Relation Age of Onset  . Diabetes Father     ORAL MEDS  . Cancer Paternal Aunt     BREAST;LIVER  . Diabetes Paternal Aunt   . Diabetes Paternal Uncle   . COPD Paternal Uncle   . Cancer Maternal Grandmother     BREAST  . COPD Mother    EMPHYSEMA  . Diabetes Paternal Grandfather   . Other Neg Hx    Social History  Substance Use Topics  . Smoking status: Current Every Day Smoker -- 0.50 packs/day for 12 years    Types: Cigarettes  . Smokeless tobacco: Never Used     Comment: 1 pack lasts 4 days  . Alcohol Use: No   OB History    Gravida Para Term Preterm AB TAB SAB Ectopic Multiple Living   Review of Systems  All other systems reviewed and are negative.     Allergies  Latex; Baby oil; and Morphine and related  Home Medications   Prior to Admission medications   Medication Sig Start Date End Date Taking? Authorizing Provider  Multiple Vitamin (MULTIVITAMIN WITH MINERALS) TABS tablet Take 1 tablet by mouth daily.   Yes Historical Provider, MD  amoxicillin (AMOXIL) 500 MG capsule Take 1 capsule (500 mg total) by mouth 3 (three) times daily. 11/19/14   Hope Orlene Och, NP  cetirizine-pseudoephedrine (ZYRTEC-D) 5-120 MG tablet Take 1 tablet by mouth 2 (two) times daily. 07/22/15   Burgess Amor, PA-C  HYDROcodone-acetaminophen (NORCO/VICODIN) 5-325 MG tablet Take 2 tablets by mouth every 4 (four) hours as needed. 10/29/15   Mancel Bale, MD  ibuprofen (ADVIL,MOTRIN) 600 MG tablet Take 1 tablet (600 mg total) by mouth every 6 (six)  hours as needed. 07/22/15   Burgess AmorJulie Idol, PA-C  loratadine (CLARITIN) 10 MG tablet Take 1 tablet (10 mg total) by mouth daily. 04/10/14   Loren Raceravid Yelverton, MD  mometasone (NASONEX) 50 MCG/ACT nasal spray Place 2 sprays into the nose daily. 04/10/14   Loren Raceravid Yelverton, MD  naproxen (NAPROSYN) 500 MG tablet Take 1 tablet (500 mg total) by mouth 2 (two) times daily. 11/19/14   Hope Orlene OchM Neese, NP  oxyCODONE-acetaminophen (PERCOCET/ROXICET) 5-325 MG per tablet Take 1 tablet by mouth every 6 (six) hours as needed. 05/13/15   Ivery QualeHobson Bryant, PA-C  oxymetazoline (AFRIN NASAL SPRAY) 0.05 % nasal spray Place 1 spray into both nostrils 2 (two) times daily. 04/10/14   Loren Raceravid Yelverton, MD  predniSONE  (DELTASONE) 20 MG tablet Take 2 tablets (40 mg total) by mouth daily. Patient not taking: Reported on 11/24/2014 04/03/14   Joni ReiningNicole Pisciotta, PA-C  promethazine (PHENERGAN) 25 MG tablet Take 1 tablet (25 mg total) by mouth every 6 (six) hours as needed. 11/24/14   Donnetta HutchingBrian Cook, MD   BP 104/53 mmHg  Pulse 76  Temp(Src) 97.4 F (36.3 C) (Oral)  Resp 20  Ht 4\' 11"  (1.499 m)  Wt 98 lb (44.453 kg)  BMI 19.78 kg/m2  SpO2 100%  LMP 10/08/2015 (Approximate) Physical Exam  Constitutional: She is oriented to person, place, and time. She appears well-developed and well-nourished. She appears distressed (she is uncomfortable).  HENT:  Head: Normocephalic and atraumatic.  Right Ear: External ear normal.  Left Ear: External ear normal.  Eyes: Conjunctivae and EOM are normal. Pupils are equal, round, and reactive to light.  Neck: Normal range of motion and phonation normal. Neck supple.  Cardiovascular: Normal rate, regular rhythm and normal heart sounds.   Pulmonary/Chest: Effort normal and breath sounds normal. She exhibits no bony tenderness.  Abdominal: Soft. She exhibits no mass. There is tenderness. There is guarding. There is no rebound.  Moderate mid suprapubic and left pelvic tenderness palpated, in the lower abdominal region.  Genitourinary:  Normal external female genitalia. No vaginal discharge, bleeding, injury to mucosal or vaginal walls. By mouth examination. She has moderate midline and left-sided tenderness with palpable mass, or uterine enlargement.  Musculoskeletal: Normal range of motion.  Neurological: She is alert and oriented to person, place, and time. No cranial nerve deficit or sensory deficit. She exhibits normal muscle tone. Coordination normal.  Skin: Skin is warm, dry and intact.  Psychiatric: She has a normal mood and affect. Her behavior is normal. Judgment and thought content normal.  Nursing note and vitals reviewed.   ED Course  Procedures (including critical care  time)  Medications  HYDROcodone-acetaminophen (NORCO/VICODIN) 5-325 MG per tablet 2 tablet (2 tablets Oral Given 10/29/15 2059)    Patient Vitals for the past 24 hrs:  BP Temp Temp src Pulse Resp SpO2 Height Weight  10/29/15 2045 - - - 76 - 100 % - -  10/29/15 2030 (!) 104/53 mmHg - - 73 - 99 % - -  10/29/15 2015 - - - 83 - 99 % - -  10/29/15 2014 117/83 mmHg 97.4 F (36.3 C) Oral 73 20 100 % 4\' 11"  (1.499 m) 98 lb (44.453 kg)    10:08 PM Reevaluation with update and discussion. After initial assessment and treatment, an updated evaluation reveals she is more comfortable, after treatment. Findings discussed with the patient. Questions answered. Pelvic ultrasound ordered, to be done in the morning. Jaymes Hang L     Labs Review Labs Reviewed  CBC  WITH DIFFERENTIAL/PLATELET - Abnormal; Notable for the following:    WBC 12.5 (*)    Neutro Abs 10.5 (*)    All other components within normal limits    Imaging Review No results found. I have personally reviewed and evaluated these images and lab results as part of my medical decision-making.   EKG Interpretation None      MDM   Final diagnoses:  Pelvic pain in female    Nonspecific pelvic pain post coitus. No significant visualize injury to the vagina. Possible ovarian cyst, but it was not palpable. Doubt STD.  Nursing Notes Reviewed/ Care Coordinated Applicable Imaging Reviewed Interpretation of Laboratory Data incorporated into ED treatment  The patient appears reasonably screened and/or stabilized for discharge and I doubt any other medical condition or other [facility] requiring further screening, evaluation, or treatment in the ED at this time prior to discharge.  Plan: Home Medications- Norco; Home Treatments- rest; return here if the recommended treatment, does not improve the symptoms; Recommended follow up- AM Pelvic U/S     Mancel Bale, MD 10/29/15 2210

## 2015-10-29 NOTE — Discharge Instructions (Signed)

## 2015-10-29 NOTE — ED Notes (Signed)
Per EMS, pt stated she was having sex when the pain started.

## 2015-10-29 NOTE — ED Notes (Signed)
Pt c/o lower abdominal pain with nausea and vomiting.

## 2015-10-30 ENCOUNTER — Other Ambulatory Visit (HOSPITAL_COMMUNITY): Payer: Self-pay | Admitting: Emergency Medicine

## 2015-10-30 ENCOUNTER — Ambulatory Visit (HOSPITAL_COMMUNITY)
Admit: 2015-10-30 | Discharge: 2015-10-30 | Disposition: A | Discharge status: Home / Self Care | Payer: Self-pay | Attending: Emergency Medicine | Admitting: Emergency Medicine

## 2015-10-30 DIAGNOSIS — R102 Pelvic and perineal pain: Secondary | ICD-10-CM

## 2015-10-30 MED FILL — Hydrocodone-Acetaminophen Tab 5-325 MG: ORAL | Qty: 6 | Status: AC

## 2015-10-30 NOTE — ED Provider Notes (Signed)
D/w the pt the results - she will have f/u of the US report in the next 2 months.  Eber HongBrian Glorimar Stroope, MD 10/30/15 1534

## 2016-04-26 ENCOUNTER — Emergency Department (HOSPITAL_COMMUNITY): Payer: Self-pay

## 2016-04-26 ENCOUNTER — Encounter (HOSPITAL_COMMUNITY): Payer: Self-pay

## 2016-04-26 ENCOUNTER — Emergency Department (HOSPITAL_COMMUNITY)
Admission: EM | Admit: 2016-04-26 | Discharge: 2016-04-26 | Disposition: A | Discharge status: Home / Self Care | Payer: Self-pay | Attending: Emergency Medicine | Admitting: Emergency Medicine

## 2016-04-26 DIAGNOSIS — Z79899 Other long term (current) drug therapy: Secondary | ICD-10-CM | POA: Insufficient documentation

## 2016-04-26 DIAGNOSIS — N83202 Unspecified ovarian cyst, left side: Secondary | ICD-10-CM | POA: Insufficient documentation

## 2016-04-26 DIAGNOSIS — F17210 Nicotine dependence, cigarettes, uncomplicated: Secondary | ICD-10-CM | POA: Insufficient documentation

## 2016-04-26 DIAGNOSIS — Z791 Long term (current) use of non-steroidal anti-inflammatories (NSAID): Secondary | ICD-10-CM | POA: Insufficient documentation

## 2016-04-26 DIAGNOSIS — R102 Pelvic and perineal pain unspecified side: Secondary | ICD-10-CM

## 2016-04-26 DIAGNOSIS — R1031 Right lower quadrant pain: Secondary | ICD-10-CM | POA: Insufficient documentation

## 2016-04-26 DIAGNOSIS — N83201 Unspecified ovarian cyst, right side: Secondary | ICD-10-CM

## 2016-04-26 LAB — COMPREHENSIVE METABOLIC PANEL
ALBUMIN: 4.6 g/dL (ref 3.5–5.0)
ALK PHOS: 28 U/L — AB (ref 38–126)
ALT: 13 U/L — ABNORMAL LOW (ref 14–54)
ANION GAP: 9 (ref 5–15)
AST: 20 U/L (ref 15–41)
BILIRUBIN TOTAL: 0.4 mg/dL (ref 0.3–1.2)
BUN: 16 mg/dL (ref 6–20)
CALCIUM: 9.2 mg/dL (ref 8.9–10.3)
CO2: 23 mmol/L (ref 22–32)
Chloride: 107 mmol/L (ref 101–111)
Creatinine, Ser: 0.65 mg/dL (ref 0.44–1.00)
GFR calc non Af Amer: >60 mL/min (ref >60)
GLUCOSE: 91 mg/dL (ref 65–99)
POTASSIUM: 3.4 mmol/L — AB (ref 3.5–5.1)
SODIUM: 139 mmol/L (ref 135–145)
TOTAL PROTEIN: 7.5 g/dL (ref 6.5–8.1)

## 2016-04-26 LAB — CBC WITH DIFFERENTIAL/PLATELET
BASOS PCT: 0 %
Basophils Absolute: 0 10*3/uL (ref 0.0–0.1)
EOS ABS: 0.2 10*3/uL (ref 0.0–0.7)
Eosinophils Relative: 3 %
HEMATOCRIT: 38.3 % (ref 36.0–46.0)
HEMOGLOBIN: 12.8 g/dL (ref 12.0–15.0)
LYMPHS ABS: 2.1 10*3/uL (ref 0.7–4.0)
Lymphocytes Relative: 31 %
MCH: 30.6 pg (ref 26.0–34.0)
MCHC: 33.4 g/dL (ref 30.0–36.0)
MCV: 91.6 fL (ref 78.0–100.0)
MONO ABS: 0.4 10*3/uL (ref 0.1–1.0)
MONOS PCT: 5 %
Neutro Abs: 4 10*3/uL (ref 1.7–7.7)
Neutrophils Relative %: 61 %
Platelets: 358 10*3/uL (ref 150–400)
RBC: 4.18 MIL/uL (ref 3.87–5.11)
RDW: 14.6 % (ref 11.5–15.5)
WBC: 6.7 10*3/uL (ref 4.0–10.5)

## 2016-04-26 LAB — URINALYSIS, ROUTINE W REFLEX MICROSCOPIC
Bilirubin Urine: NEGATIVE
GLUCOSE, UA: NEGATIVE mg/dL
HGB URINE DIPSTICK: NEGATIVE
KETONES UR: NEGATIVE mg/dL
Leukocytes, UA: NEGATIVE
Nitrite: NEGATIVE
PH: 6 (ref 5.0–8.0)
PROTEIN: NEGATIVE mg/dL
Specific Gravity, Urine: <1.005 — ABNORMAL LOW (ref 1.005–1.030)

## 2016-04-26 LAB — WET PREP, GENITAL
Sperm: NONE SEEN
TRICH WET PREP: NONE SEEN
Yeast Wet Prep HPF POC: NONE SEEN

## 2016-04-26 LAB — POC URINE PREG, ED: Preg Test, Ur: NEGATIVE

## 2016-04-26 MED ORDER — SODIUM CHLORIDE 0.9 % IV BOLUS (SEPSIS)
1000.0000 mL | Freq: Once | INTRAVENOUS | Status: AC
  Administered 2016-04-26: 1000 mL via INTRAVENOUS

## 2016-04-26 MED ORDER — AZITHROMYCIN 250 MG PO TABS
1000.0000 mg | ORAL_TABLET | Freq: Once | ORAL | Status: AC
  Administered 2016-04-26: 1000 mg via ORAL
  Filled 2016-04-26: qty 4

## 2016-04-26 MED ORDER — IOPAMIDOL (ISOVUE-300) INJECTION 61%
75.0000 mL | Freq: Once | INTRAVENOUS | Status: AC | PRN
  Administered 2016-04-26: 75 mL via INTRAVENOUS

## 2016-04-26 MED ORDER — KETOROLAC TROMETHAMINE 30 MG/ML IJ SOLN
30.0000 mg | Freq: Once | INTRAMUSCULAR | Status: AC
  Administered 2016-04-26: 30 mg via INTRAVENOUS
  Filled 2016-04-26: qty 1

## 2016-04-26 MED ORDER — IOPAMIDOL (ISOVUE-300) INJECTION 61%
INTRAVENOUS | Status: AC
  Administered 2016-04-26: 30 mL via ORAL
  Filled 2016-04-26: qty 30

## 2016-04-26 MED ORDER — DEXTROSE 5 % IV SOLN
1.0000 g | Freq: Once | INTRAVENOUS | Status: AC
  Administered 2016-04-26: 1 g via INTRAVENOUS
  Filled 2016-04-26: qty 10

## 2016-04-26 NOTE — ED Triage Notes (Signed)
Pt reports lower abd pain and swelling for the past 4 months.  Pt says she feels fine at night but has pain when she wakes up.  Pt also reports nausea, no vomiting or diarrhea.  LMP was Aug 14 it lasted for 7 days but then started again 4 days later.  Pt also reports was diagnosed with HPV 4 years ago and did not have any treatment.

## 2016-04-26 NOTE — ED Provider Notes (Signed)
[facility] Provider Note   CSN: [id number] Arrival date & time: 04/26/16  0715     History   Chief Complaint Chief Complaint  Patient presents with  . Abdominal Pain    HPI Ayomide L Smestad is a 30 y.o. female.  HPI 30 year old female presents today complaining of pelvic pain for several months.  She states that she has had painful intercourse. She has had increased bloating. She states that her weight has always been low and she has difficulty keeping it up. She has not noted any abnormal vaginal discharge, nausea, vomiting, fever, or chills. She states that she did have some abnormal vaginal bleeding several weeks ago with normal menstrual periods earlier in the month. She has had the same sexual partner for many years. Past Medical History:  Diagnosis Date  . BV (bacterial vaginosis)   . HPV (human papilloma virus) infection   . Normal pregnancy, incidental 12/23/2011  . Preterm labor   . Yeast infection     Patient Active Problem List   Diagnosis Date Noted  . Erroneous encounter - disregard 08/03/2012  . Anemia 06/14/2012  . Status post repeat low transverse cesarean section/BTL 06/13/2012  . Tobacco abuse 12/23/2011  . Substance abuse complicating pregnancy, antepartum 12/23/2011    Past Surgical History:  Procedure Laterality Date  . CESAREAN SECTION    . CESAREAN SECTION  06/12/2012   Procedure: CESAREAN SECTION;  Surgeon: Kirkland Hun, MD;  Location: [facility];  Service: Obstetrics;  Laterality: N/A;  Repeat  . CHOLECYSTECTOMY N/A 01/03/2014   Procedure: LAPAROSCOPIC CHOLECYSTECTOMY;  Surgeon: Dalia Heading, MD;  Location: [facility];  Service: General;  Laterality: N/A;  . FOOT SURGERY  2002   STEPPED ON SEWING NEEDLE  . TOOTH EXTRACTION  2009   ONE TOOTH  . TUBAL LIGATION      OB History    Gravida Para Term Preterm AB Living   2 2 2     2    SAB TAB Ectopic Multiple Live Births           2       Home Medications    Prior to Admission  medications   Medication Sig Start Date End Date Taking? Authorizing Provider  cetirizine-pseudoephedrine (ZYRTEC-D) 5-120 MG tablet Take 1 tablet by mouth 2 (two) times daily. Patient taking differently: Take 1 tablet by mouth daily as needed for allergies.  07/22/15  Yes Burgess Amor, PA-C  ibuprofen (ADVIL,MOTRIN) 200 MG tablet Take 600-800 mg by mouth every 6 (six) hours as needed for moderate pain.   Yes Historical Provider, MD  Multiple Vitamin (MULTIVITAMIN WITH MINERALS) TABS tablet Take 1 tablet by mouth daily.   Yes Historical Provider, MD  HYDROcodone-acetaminophen (NORCO/VICODIN) 5-325 MG tablet Take 2 tablets by mouth every 4 (four) hours as needed. Patient not taking: Reported on 04/26/2016 10/29/15   Mancel Bale, MD    Family History Family History  Problem Relation Age of Onset  . Diabetes Father     ORAL MEDS  . Cancer Paternal Aunt     BREAST;LIVER  . Diabetes Paternal Aunt   . Diabetes Paternal Uncle   . COPD Paternal Uncle   . Cancer Maternal Grandmother     BREAST  . COPD Mother     EMPHYSEMA  . Diabetes Paternal Grandfather   . Other Neg Hx     Social History Social History  Substance Use Topics  . Smoking status: Current Every Day Smoker    Packs/day: 0.50  Years: 12.00    Types: Cigarettes  . Smokeless tobacco: Never Used     Comment: 1 pack lasts 4 days  . Alcohol use No     Allergies   Latex; Baby oil; and Morphine and related   Review of Systems Review of Systems  All other systems reviewed and are negative.    Physical Exam Updated Vital Signs BP 125/78 (BP Location: Left Arm)   Pulse 118   Temp 98 F (36.7 C) (Oral)   Resp 18   Ht 4\' 11"  (1.499 m)   Wt 41.7 kg   LMP 04/01/2016   SpO2 100%   BMI 18.58 kg/m   Physical Exam  Constitutional: She appears well-developed and well-nourished. No distress.  Thin  HENT:  Head: Normocephalic and atraumatic.  Right Ear: External ear normal.  Left Ear: External ear normal.  Nose:  Nose normal.  Mouth/Throat: Oropharynx is clear and moist.  Eyes: Conjunctivae and EOM are normal. Pupils are equal, round, and reactive to light.  Neck: Normal range of motion. Neck supple.  Cardiovascular: Normal rate, regular rhythm and normal heart sounds.   Pulmonary/Chest: Effort normal and breath sounds normal.  Abdominal: Soft.  Mild diffuse distention with diffuse tenderness to palpation bilateral lower quadrants  Genitourinary: Vagina normal.  Genitourinary Comments: No cervical discharge noted but diffuse tenderness to uterus and ovaries with some right-sided fullness felt  Musculoskeletal: Normal range of motion. She exhibits no deformity.  Neurological: She is alert. She has normal reflexes.  Skin: Skin is warm and dry. Capillary refill takes less than 2 seconds.  Psychiatric: She has a normal mood and affect. Her behavior is normal. Thought content normal.  Nursing note and vitals reviewed.    ED Treatments / Results  Labs (all labs ordered are listed, but only abnormal results are displayed) Labs Reviewed  WET PREP, GENITAL - Abnormal; Notable for the following:       Result Value   Clue Cells Wet Prep HPF POC PRESENT (*)    WBC, Wet Prep HPF POC RARE (*)    All other components within normal limits  URINALYSIS, ROUTINE W REFLEX MICROSCOPIC (NOT AT [facility]) - Abnormal; Notable for the following:    Specific Gravity, Urine <1.005 (*)    All other components within normal limits  COMPREHENSIVE METABOLIC PANEL - Abnormal; Notable for the following:    Potassium 3.4 (*)    ALT 13 (*)    Alkaline Phosphatase 28 (*)    All other components within normal limits  CBC WITH DIFFERENTIAL/PLATELET  POC URINE PREG, ED  POC URINE PREG, ED  GC/CHLAMYDIA PROBE AMP ([place]) NOT AT [facility]    EKG  EKG Interpretation None       Radiology Ct Abdomen Pelvis W Contrast  Result Date: 04/26/2016 CLINICAL DATA:  Lower abdominal pain and swelling for 4 months. Nausea. EXAM: CT  ABDOMEN AND PELVIS WITH CONTRAST TECHNIQUE: Multidetector CT imaging of the abdomen and pelvis was performed using the standard protocol following bolus administration of intravenous contrast. CONTRAST:  75mL ISOVUE-300 IOPAMIDOL (ISOVUE-300) INJECTION 61% COMPARISON:  None. FINDINGS: Lower chest: Lung bases are clear. No effusions. Heart is normal size. Hepatobiliary: Prior cholecystectomy.  No focal hepatic abnormality. Pancreas: No focal abnormality or ductal dilatation. Spleen: No focal abnormality.  Normal size. Adrenals/Urinary Tract: No adrenal abnormality. No focal renal abnormality. No stones or hydronephrosis. Urinary bladder is unremarkable. Stomach/Bowel: Appendix is normal. Stomach, large and small bowel grossly unremarkable. Vascular/Lymphatic: No evidence of aneurysm  or adenopathy. Reproductive: 3.8 cm cyst in the left ovary. 2.4 cm cyst in the right ovary. Uterus is unremarkable. Other: No free fluid or free air. Musculoskeletal: No acute bony abnormality or focal bone lesion. IMPRESSION: Normal appendix. Bilateral ovarian cysts, left larger than right. Electronically Signed   By: Charlett NoseKevin  Dover M.D.   On: 04/26/2016 10:53    Procedures Procedures (including critical care time)  Medications Ordered in ED Medications  cefTRIAXone (ROCEPHIN) 1 g in dextrose 5 % 50 mL IVPB (not administered)  azithromycin (ZITHROMAX) tablet 1,000 mg (not administered)  sodium chloride 0.9 % bolus 1,000 mL (1,000 mLs Intravenous New Bag/Given 04/26/16 0848)  iopamidol (ISOVUE-300) 61 % injection (30 mLs Oral Contrast Given 04/26/16 0842)  iopamidol (ISOVUE-300) 61 % injection 75 mL (75 mLs Intravenous Contrast Given 04/26/16 1037)     Initial Impression / Assessment and Plan / ED Course  I have reviewed the triage vital signs and the nursing notes.  Pertinent labs & imaging results that were available during my care of the patient were reviewed by me and considered in my medical decision making (see chart for  details).  Clinical Course    Patient feels improved here after IV Toradol. CT and ultrasound obtained. Bilateral ovarian cysts noted on ultrasound. Patient care discussed with Dr. Emelda FearFerguson and he will see patient in follow-up in office.  Final Clinical Impressions(s) / ED Diagnoses   Final diagnoses:  Pelvic pain in female  Bilateral ovarian cysts    New Prescriptions New Prescriptions   No medications on file     Margarita Grizzleanielle Sidni Fusco, MD 04/26/16 1553

## 2016-04-26 NOTE — Discharge Instructions (Signed)
Call Dr. Rayna SextonFerguson's office on Monday for follow-up. Please let them know that you were seen in the ED and referred there and that I spoke to Dr. Emelda FearFerguson.

## 2016-04-29 LAB — GC/CHLAMYDIA PROBE AMP (~~LOC~~) NOT AT ARMC
Chlamydia: NEGATIVE
Neisseria Gonorrhea: NEGATIVE

## 2016-04-30 ENCOUNTER — Ambulatory Visit (INDEPENDENT_AMBULATORY_CARE_PROVIDER_SITE_OTHER): Payer: Self-pay | Admitting: Obstetrics & Gynecology

## 2016-04-30 ENCOUNTER — Encounter: Payer: Self-pay | Admitting: Obstetrics & Gynecology

## 2016-04-30 VITALS — BP 106/60 | Ht 59.0 in | Wt 100.0 lb

## 2016-04-30 DIAGNOSIS — N83202 Unspecified ovarian cyst, left side: Secondary | ICD-10-CM

## 2016-04-30 DIAGNOSIS — N83201 Unspecified ovarian cyst, right side: Secondary | ICD-10-CM

## 2016-04-30 DIAGNOSIS — R1030 Lower abdominal pain, unspecified: Secondary | ICD-10-CM

## 2016-04-30 MED ORDER — MEGESTROL ACETATE 40 MG PO TABS: 40.0000 mg | ORAL_TABLET | Freq: Every day | ORAL | 11 refills | Status: DC

## 2016-04-30 NOTE — Progress Notes (Signed)
      Chief Complaint  Patient presents with  . Follow-up    [name]/ lower abdominal pain,had ultrasound done    Blood pressure 106/60, height 4\' 11"  (1.499 m), weight 100 lb (45.4 kg), last menstrual period 04/30/2016.  29 y.o. [id number] Patient's last menstrual period was 04/30/2016. The current method of family planning is tubal ligation.  Subjective Pt with LLQ for past couple of months Went to ED and had sonogram which revealed small bilateral hemorrhagic cysts, I lean hemorrhagic vs endometriomas because they were not there in April  Objective   Pertinent ROS   Labs or studies     Impression Diagnoses this Encounter::   ICD-9-CM ICD-10-CM   1. Bilateral ovarian cysts 620.2 N83.201 Sophia Garcia Pelvis Complete    N83.202 Sophia Garcia Transvaginal Non-OB    Established relevant diagnosis(es):   Plan/Recommendations: Meds ordered this encounter  Medications  . megestrol (MEGACE) 40 MG tablet    Sig: Take 1 tablet (40 mg total) by mouth daily.    Dispense:  30 tablet    Refill:  11    Labs or Scans Ordered: Orders Placed This Encounter  Procedures  . Sophia Garcia Pelvis Complete  . Sophia Garcia Transvaginal Non-OB    Management::   Follow up Return in about 3 months (around 07/30/2016) for GYN sono, Follow up, with Dr Sophia Garcia.        Face to face time:  20 minutes  Greater than 50% of the visit time was spent in counseling and coordination of care with the patient.  The summary and outline of the counseling and care coordination is summarized in the note above.   All questions were answered.

## 2016-05-20 ENCOUNTER — Telehealth: Payer: Self-pay | Admitting: Obstetrics & Gynecology

## 2016-05-20 NOTE — Telephone Encounter (Signed)
Go ahead and double up on the megestrol 80 mg daily and see if that stops the bleeding  sorry

## 2016-05-20 NOTE — Telephone Encounter (Signed)
Pt states she is still having vaginal bleeding, taking Megace 40 mg daily since 04/30/2016. Please advise.

## 2016-05-21 NOTE — Telephone Encounter (Signed)
Pt informed of Dr. Eure recommendation. 

## 2016-07-30 ENCOUNTER — Ambulatory Visit: Payer: Self-pay | Admitting: Obstetrics & Gynecology

## 2016-07-30 ENCOUNTER — Ambulatory Visit: Payer: Self-pay

## 2016-08-28 ENCOUNTER — Other Ambulatory Visit: Payer: Self-pay

## 2016-08-28 ENCOUNTER — Ambulatory Visit: Payer: Self-pay | Admitting: Advanced Practice Midwife

## 2017-04-16 ENCOUNTER — Emergency Department (HOSPITAL_COMMUNITY): Payer: Self-pay

## 2017-04-16 ENCOUNTER — Emergency Department (HOSPITAL_COMMUNITY)
Admission: EM | Admit: 2017-04-16 | Discharge: 2017-04-16 | Disposition: A | Discharge status: Home / Self Care | Payer: Self-pay | Attending: Emergency Medicine | Admitting: Emergency Medicine

## 2017-04-16 ENCOUNTER — Encounter (HOSPITAL_COMMUNITY): Payer: Self-pay

## 2017-04-16 DIAGNOSIS — R0789 Other chest pain: Secondary | ICD-10-CM | POA: Insufficient documentation

## 2017-04-16 DIAGNOSIS — F17210 Nicotine dependence, cigarettes, uncomplicated: Secondary | ICD-10-CM | POA: Insufficient documentation

## 2017-04-16 DIAGNOSIS — Z79899 Other long term (current) drug therapy: Secondary | ICD-10-CM | POA: Insufficient documentation

## 2017-04-16 MED ORDER — IBUPROFEN 600 MG PO TABS: 600.0000 mg | ORAL_TABLET | Freq: Four times a day (QID) | ORAL | 0 refills | Status: DC | PRN

## 2017-04-16 MED ORDER — METHOCARBAMOL 500 MG PO TABS: 500.0000 mg | ORAL_TABLET | Freq: Two times a day (BID) | ORAL | 0 refills | Status: DC

## 2017-04-16 NOTE — ED Triage Notes (Signed)
Pt reports woke up with pain in left chest and shoulder this morning.  Denies injury.  Pt says hurts to touch area.  Pt sitting in triage with left arm raised because says it makes chest feel better.

## 2017-04-16 NOTE — Discharge Instructions (Signed)
See your Physician for recheck if pain persist ?

## 2017-04-16 NOTE — ED Provider Notes (Signed)
[facility] Provider Note   CSN: [id number] Arrival date & time: 04/16/17  1052     History   Chief Complaint Chief Complaint  Patient presents with  . Chest Pain    HPI Sophia Garcia is a 31 y.o. female.  The history is provided by the patient. No language interpreter was used.  Chest Pain   This is a new problem. The problem occurs constantly. The problem has not changed since onset.The pain is associated with movement. The pain is present in the lateral region. The pain is moderate. The pain does not radiate. The symptoms are aggravated by deep breathing. She has tried nothing for the symptoms. The treatment provided moderate relief. There are no known risk factors.    Past Medical History:  Diagnosis Date  . BV (bacterial vaginosis)   . HPV (human papilloma virus) infection   . Normal pregnancy, incidental 12/23/2011  . Preterm labor   . Yeast infection     Patient Active Problem List   Diagnosis Date Noted  . Erroneous encounter - disregard 08/03/2012  . Anemia 06/14/2012  . Status post repeat low transverse cesarean section/BTL 06/13/2012  . Tobacco abuse 12/23/2011  . Substance abuse complicating pregnancy, antepartum 12/23/2011    Past Surgical History:  Procedure Laterality Date  . CESAREAN SECTION    . CESAREAN SECTION  06/12/2012   Procedure: CESAREAN SECTION;  Surgeon: Kirkland Hun, MD;  Location: [facility];  Service: Obstetrics;  Laterality: N/A;  Repeat  . CHOLECYSTECTOMY N/A 01/03/2014   Procedure: LAPAROSCOPIC CHOLECYSTECTOMY;  Surgeon: Dalia Heading, MD;  Location: [facility];  Service: General;  Laterality: N/A;  . FOOT SURGERY  2002   STEPPED ON SEWING NEEDLE  . TOOTH EXTRACTION  2009   ONE TOOTH  . TUBAL LIGATION      OB History    Gravida Para Term Preterm AB Living   2 2 2     2    SAB TAB Ectopic Multiple Live Births           2       Home Medications    Prior to Admission medications   Medication Sig Start Date End Date  Taking? Authorizing Provider  cetirizine-pseudoephedrine (ZYRTEC-D) 5-120 MG tablet Take 1 tablet by mouth 2 (two) times daily. Patient not taking: Reported on 04/30/2016 07/22/15   Burgess Amor, PA-C  ibuprofen (ADVIL,MOTRIN) 600 MG tablet Take 1 tablet (600 mg total) by mouth every 6 (six) hours as needed. 04/16/17   Elson Areas, PA-C  megestrol (MEGACE) 40 MG tablet Take 1 tablet (40 mg total) by mouth daily. 04/30/16   Lazaro Arms, MD  methocarbamol (ROBAXIN) 500 MG tablet Take 1 tablet (500 mg total) by mouth 2 (two) times daily. 04/16/17   Elson Areas, PA-C  Multiple Vitamin (MULTIVITAMIN WITH MINERALS) TABS tablet Take 1 tablet by mouth daily.    [provider]    Family History Family History  Problem Relation Age of Onset  . Diabetes Father        ORAL MEDS  . Cancer Paternal Aunt        BREAST;LIVER  . Diabetes Paternal Aunt   . Diabetes Paternal Uncle   . COPD Paternal Uncle   . Cancer Maternal Grandmother        BREAST  . COPD Mother        EMPHYSEMA  . Diabetes Paternal Grandfather   . Other Neg Hx     Social History  Social History  Substance Use Topics  . Smoking status: Current Every Day Smoker    Packs/day: 0.50    Years: 12.00    Types: Cigarettes  . Smokeless tobacco: Never Used     Comment: 1 pack lasts 4 days  . Alcohol use No     Allergies   Latex; Baby oil; and Morphine and related   Review of Systems Review of Systems  Cardiovascular: Positive for chest pain.  All other systems reviewed and are negative.    Physical Exam Updated Vital Signs BP 112/77 (BP Location: Right Arm)   Pulse 70   Temp 98.1 F (36.7 C) (Oral)   Resp 16   Ht 4\' 11"  (1.499 m)   Wt 40.4 kg (89 lb)   LMP 04/12/2017   SpO2 100%   BMI 17.98 kg/m   Physical Exam  Constitutional: She appears well-developed and well-nourished.  HENT:  Head: Normocephalic.  Eyes: Pupils are equal, round, and reactive to light.  Neck: Normal range of motion.    Cardiovascular: Normal rate and regular rhythm.   Pulmonary/Chest: Effort normal and breath sounds normal.  Abdominal: Soft.  Musculoskeletal: Normal range of motion.  Neurological: She is alert.  Skin: Skin is warm.  Psychiatric: She has a normal mood and affect.  Nursing note and vitals reviewed.    ED Treatments / Results  Labs (all labs ordered are listed, but only abnormal results are displayed) Labs Reviewed - No data to display  EKG  EKG Interpretation None       Radiology Dg Chest 2 View  Result Date: 04/16/2017 CLINICAL DATA:  Left chest pain EXAM: CHEST  2 VIEW COMPARISON:  None. FINDINGS: Normal heart size. Normal mediastinal contour. No pneumothorax. No pleural effusion. Lungs appear clear, with no acute consolidative airspace disease and no pulmonary edema. IMPRESSION: No active cardiopulmonary disease. Electronically Signed   By: Delbert Phenix M.D.   On: 04/16/2017 12:13    Procedures Procedures (including critical care time)  Medications Ordered in ED Medications - No data to display   Initial Impression / Assessment and Plan / ED Course  I have reviewed the triage vital signs and the nursing notes.  Pertinent labs & imaging results that were available during my care of the patient were reviewed by me and considered in my medical decision making (see chart for details).     Symptoms seem muscular,  reproducible with moving arm.   LUng and heart sound are normal. No shortness of breath.   Final Clinical Impressions(s) / ED Diagnoses   Final diagnoses:  Chest wall pain   Meds ordered this encounter  Medications  . methocarbamol (ROBAXIN) 500 MG tablet    Sig: Take 1 tablet (500 mg total) by mouth 2 (two) times daily.    Dispense:  20 tablet    Refill:  0    Order Specific Question:   Supervising Provider    Answer:   MILLER, BRIAN [[id number]]  . ibuprofen (ADVIL,MOTRIN) 600 MG tablet    Sig: Take 1 tablet (600 mg total) by mouth every 6 (six) hours  as needed.    Dispense:  30 tablet    Refill:  0    Order Specific Question:   Supervising Provider    Answer:   Eber Hong [[id number]]   New Prescriptions Discharge Medication List as of 04/16/2017  1:57 PM    START taking these medications   Details  methocarbamol (ROBAXIN) 500 MG tablet Take 1 tablet (500  mg total) by mouth 2 (two) times daily., Starting Wed 04/16/2017, Print      An After Visit Summary was printed and given to the patient.   Elson Areas, [place] 04/16/17 1423    Mancel Bale, MD 04/17/17 1435

## 2017-04-16 NOTE — ED Notes (Signed)
Pt made aware to return if symptoms worsen or if any life threatening symptoms occur.   

## 2017-05-15 ENCOUNTER — Encounter (HOSPITAL_COMMUNITY): Payer: Self-pay | Admitting: *Deleted

## 2017-05-15 ENCOUNTER — Emergency Department (HOSPITAL_COMMUNITY)
Admission: EM | Admit: 2017-05-15 | Discharge: 2017-05-16 | Disposition: A | Discharge status: Home / Self Care | Payer: Self-pay | Attending: Emergency Medicine | Admitting: Emergency Medicine

## 2017-05-15 DIAGNOSIS — N3001 Acute cystitis with hematuria: Secondary | ICD-10-CM | POA: Insufficient documentation

## 2017-05-15 DIAGNOSIS — Z91040 Latex allergy status: Secondary | ICD-10-CM | POA: Insufficient documentation

## 2017-05-15 DIAGNOSIS — R102 Pelvic and perineal pain: Secondary | ICD-10-CM

## 2017-05-15 DIAGNOSIS — F17210 Nicotine dependence, cigarettes, uncomplicated: Secondary | ICD-10-CM | POA: Insufficient documentation

## 2017-05-15 LAB — BASIC METABOLIC PANEL
Anion gap: 9 (ref 5–15)
BUN: 14 mg/dL (ref 6–20)
CO2: 26 mmol/L (ref 22–32)
Calcium: 9.4 mg/dL (ref 8.9–10.3)
Chloride: 106 mmol/L (ref 101–111)
Creatinine, Ser: 0.64 mg/dL (ref 0.44–1.00)
GFR calc Af Amer: >60 mL/min (ref >60)
GFR calc non Af Amer: >60 mL/min (ref >60)
Glucose, Bld: 94 mg/dL (ref 65–99)
Potassium: 3.7 mmol/L (ref 3.5–5.1)
Sodium: 141 mmol/L (ref 135–145)

## 2017-05-15 LAB — WET PREP, GENITAL
Clue Cells Wet Prep HPF POC: NONE SEEN
Sperm: NONE SEEN
Trich, Wet Prep: NONE SEEN
Yeast Wet Prep HPF POC: NONE SEEN

## 2017-05-15 LAB — URINALYSIS, ROUTINE W REFLEX MICROSCOPIC
Bilirubin Urine: NEGATIVE
Glucose, UA: NEGATIVE mg/dL
Ketones, ur: NEGATIVE mg/dL
Nitrite: NEGATIVE
Protein, ur: NEGATIVE mg/dL
Specific Gravity, Urine: 1.002 — ABNORMAL LOW (ref 1.005–1.030)
pH: 6 (ref 5.0–8.0)

## 2017-05-15 LAB — PREGNANCY, URINE: Preg Test, Ur: NEGATIVE

## 2017-05-15 LAB — CBC
HCT: 38.2 % (ref 36.0–46.0)
Hemoglobin: 12.9 g/dL (ref 12.0–15.0)
MCH: 31.2 pg (ref 26.0–34.0)
MCHC: 33.8 g/dL (ref 30.0–36.0)
MCV: 92.3 fL (ref 78.0–100.0)
Platelets: 455 10*3/uL — ABNORMAL HIGH (ref 150–400)
RBC: 4.14 MIL/uL (ref 3.87–5.11)
RDW: 16.2 % — ABNORMAL HIGH (ref 11.5–15.5)
WBC: 12 10*3/uL — ABNORMAL HIGH (ref 4.0–10.5)

## 2017-05-15 LAB — LIPASE, BLOOD: Lipase: 69 U/L — ABNORMAL HIGH (ref 11–51)

## 2017-05-15 MED ORDER — CEPHALEXIN 500 MG PO CAPS
500.0000 mg | ORAL_CAPSULE | Freq: Once | ORAL | Status: AC
  Administered 2017-05-16: 500 mg via ORAL
  Filled 2017-05-15: qty 1

## 2017-05-15 MED ORDER — CEPHALEXIN 500 MG PO CAPS: 500.0000 mg | ORAL_CAPSULE | Freq: Three times a day (TID) | ORAL | 0 refills | Status: DC

## 2017-05-15 MED ORDER — OXYCODONE-ACETAMINOPHEN 5-325 MG PO TABS
1.0000 | ORAL_TABLET | Freq: Once | ORAL | Status: AC
  Administered 2017-05-15: 1 via ORAL
  Filled 2017-05-15: qty 1

## 2017-05-15 MED ORDER — IBUPROFEN 400 MG PO TABS
600.0000 mg | ORAL_TABLET | Freq: Once | ORAL | Status: DC
Start: 2017-05-15 — End: 2017-05-15

## 2017-05-15 MED ORDER — TRAMADOL HCL 50 MG PO TABS: 50.0000 mg | ORAL_TABLET | Freq: Four times a day (QID) | ORAL | 0 refills | Status: DC | PRN

## 2017-05-15 MED ORDER — IBUPROFEN 400 MG PO TABS
400.0000 mg | ORAL_TABLET | Freq: Once | ORAL | Status: AC
  Administered 2017-05-15: 400 mg via ORAL
  Filled 2017-05-15: qty 1

## 2017-05-15 NOTE — ED Triage Notes (Addendum)
Pt reports dysuria, n/v, and lower abdominal pain. Pt states she feels like something is trying to "crawl out of her vagina." Pt reports sharp pain running from her left lower abdomen down her left leg.

## 2017-05-15 NOTE — ED Provider Notes (Signed)
[facility] Provider Note   CSN: [id number] Arrival date & time: 05/15/17  2026     History   Chief Complaint Chief Complaint  Patient presents with  . Dysuria    HPI Sophia Garcia is a 31 y.o. female.  HPI   30yF with abdominal/pevic pain. Gradual onset ~2d ago. Progressively worsening. Vaginal discomfort. Feels like something trying to "come out" but denies discharge. Dysuria. No hematuria. No fever.   Past Medical History:  Diagnosis Date  . BV (bacterial vaginosis)   . HPV (human papilloma virus) infection   . Normal pregnancy, incidental 12/23/2011  . Preterm labor   . Yeast infection     Patient Active Problem List   Diagnosis Date Noted  . Erroneous encounter - disregard 08/03/2012  . Anemia 06/14/2012  . Status post repeat low transverse cesarean section/BTL 06/13/2012  . Tobacco abuse 12/23/2011  . Substance abuse complicating pregnancy, antepartum 12/23/2011    Past Surgical History:  Procedure Laterality Date  . CESAREAN SECTION    . CESAREAN SECTION  06/12/2012   Procedure: CESAREAN SECTION;  Surgeon: Kirkland Hun, MD;  Location: [facility];  Service: Obstetrics;  Laterality: N/A;  Repeat  . CHOLECYSTECTOMY N/A 01/03/2014   Procedure: LAPAROSCOPIC CHOLECYSTECTOMY;  Surgeon: Dalia Heading, MD;  Location: [facility];  Service: General;  Laterality: N/A;  . FOOT SURGERY  2002   STEPPED ON SEWING NEEDLE  . TOOTH EXTRACTION  2009   ONE TOOTH  . TUBAL LIGATION      OB History    Gravida Para Term Preterm AB Living   SAB TAB Ectopic Multiple Live Births           2       Home Medications    Prior to Admission medications   Medication Sig Start Date End Date Taking? Authorizing Provider  acetaminophen (TYLENOL) 500 MG tablet Take 500 mg by mouth every 6 (six) hours as needed.   Yes [provider]  ibuprofen (ADVIL,MOTRIN) 200 MG tablet Take 200 mg by mouth every 6 (six) hours as needed.   Yes [provider]    methocarbamol (ROBAXIN) 500 MG tablet Take 1 tablet (500 mg total) by mouth 2 (two) times daily. 04/16/17  Yes Elson Areas, PA-C    Family History Family History  Problem Relation Age of Onset  . Diabetes Father        ORAL MEDS  . Cancer Paternal Aunt        BREAST;LIVER  . Diabetes Paternal Aunt   . Diabetes Paternal Uncle   . COPD Paternal Uncle   . Cancer Maternal Grandmother        BREAST  . COPD Mother        EMPHYSEMA  . Diabetes Paternal Grandfather   . Other Neg Hx     Social History Social History  Substance Use Topics  . Smoking status: Current Every Day Smoker    Packs/day: 0.50    Years: 12.00    Types: Cigarettes  . Smokeless tobacco: Never Used     Comment: 1 pack lasts 4 days  . Alcohol use No     Allergies   Latex; Baby oil; and Morphine and related   Review of Systems Review of Systems  All systems reviewed and negative, other than as noted in HPI.  Physical Exam Updated Vital Signs BP 121/75 (BP Location: Left Arm)   Pulse 83  Temp 98.1 F (36.7 C) (Oral)   Resp 20   Ht  (1.499 m)   LMP 05/09/2017   SpO2 99%   Physical Exam  Constitutional: She appears well-developed and well-nourished. No distress.  HENT:  Head: Normocephalic and atraumatic.  Eyes: Conjunctivae are normal. Right eye exhibits no discharge. Left eye exhibits no discharge.  Neck: Neck supple.  Cardiovascular: Normal rate, regular rhythm and normal heart sounds.  Exam reveals no gallop and no friction rub.   No murmur heard. Pulmonary/Chest: Effort normal and breath sounds normal. No respiratory distress.  Abdominal: Soft. She exhibits no distension. There is no tenderness.  Genitourinary:  Genitourinary Comments: Chaperone present. Normal external genitalia. No concerning lesions noted. Minimal discharge consistent with physiologic. No CMT. L adnexal tenderness.   Musculoskeletal: She exhibits no edema or tenderness.  Neurological: She is alert.  Skin:  Skin is warm and dry.  Psychiatric: She has a normal mood and affect. Her behavior is normal. Thought content normal.  Nursing note and vitals reviewed.    ED Treatments / Results  Labs (all labs ordered are listed, but only abnormal results are displayed) Labs Reviewed  URINE CULTURE - Abnormal; Notable for the following:       Result Value   Culture 80,000 COLONIES/mL ESCHERICHIA COLI (*)    Organism ID, Bacteria ESCHERICHIA COLI (*)    All other components within normal limits  WET PREP, GENITAL - Abnormal; Notable for the following:    WBC, Wet Prep HPF POC RARE (*)    All other components within normal limits  URINALYSIS, ROUTINE W REFLEX MICROSCOPIC - Abnormal; Notable for the following:    Color, Urine STRAW (*)    Specific Gravity, Urine 1.002 (*)    Hgb urine dipstick LARGE (*)    Leukocytes, UA LARGE (*)    Bacteria, UA RARE (*)    Squamous Epithelial / LPF 0-5 (*)    All other components within normal limits  CBC - Abnormal; Notable for the following:    WBC 12.0 (*)    RDW 16.2 (*)    Platelets 455 (*)    All other components within normal limits  LIPASE, BLOOD - Abnormal; Notable for the following:    Lipase 69 (*)    All other components within normal limits  BASIC METABOLIC PANEL  PREGNANCY, URINE  GC/CHLAMYDIA PROBE AMP ([place]) NOT AT [facility]    EKG  EKG Interpretation None       Radiology No results found.  Procedures Procedures (including critical care time)  Medications Ordered in ED Medications  cephALEXin (KEFLEX) capsule 500 mg (not administered)  oxyCODONE-acetaminophen (PERCOCET/ROXICET) 5-325 MG per tablet 1 tablet (1 tablet Oral Given 05/15/17 2258)  ibuprofen (ADVIL,MOTRIN) tablet 400 mg (400 mg Oral Given 05/15/17 2258)     Initial Impression / Assessment and Plan / ED Course  I have reviewed the triage vital signs and the nursing notes.  Pertinent labs & imaging results that were available during my care of the patient were  reviewed by me and considered in my medical decision making (see chart for details).     Urinary symptoms and L pelvic pain. L adnexal tenderness on exam. She reports married in monogamous relationship. Declined empiric GC tx. Minimal discharge on exam. Afebrile. Abx. I feel appropriate for outpt tx. I doubt PID/TOA.   It has been determined that no acute conditions requiring further emergency intervention are present at this time. The patient has been advised of  the diagnosis and plan. I reviewed any labs and imaging including any potential incidental findings. We have discussed signs and symptoms that warrant return to the ED and they are listed in the discharge instructions.    Final Clinical Impressions(s) / ED Diagnoses   Final diagnoses:  Pelvic pain  Acute cystitis with hematuria    New Prescriptions New Prescriptions   No medications on file     Raeford Razor, MD 06/03/17 1142

## 2017-05-18 LAB — URINE CULTURE: Culture: 80000 — AB

## 2017-05-19 ENCOUNTER — Telehealth: Payer: Self-pay | Admitting: Emergency Medicine

## 2017-05-19 LAB — GC/CHLAMYDIA PROBE AMP (~~LOC~~) NOT AT ARMC
Chlamydia: NEGATIVE
Neisseria Gonorrhea: NEGATIVE

## 2017-05-19 NOTE — Telephone Encounter (Signed)
Post ED Visit - Positive Culture Follow-up  Culture report reviewed by antimicrobial stewardship pharmacist:   Enzo Bi, Pharm.D.  Celedonio Miyamoto, Pharm.D., BCPS AQ-ID  Garvin Fila, Pharm.D., BCPS  Georgina Pillion, [address].D., BCPS  [place], [address].D., BCPS, AAHIVP  Estella Husk, Pharm.D., BCPS, AAHIVP  Lysle Pearl, PharmD, BCPS  Casilda Carls, PharmD, BCPS  Pollyann Samples, PharmD, BCPS  Positive urine culture Treated with cephalexin, organism sensitive to the same and no further patient follow-up is required at this time.  Berle Mull 05/19/2017, 1:26 PM

## 2017-10-16 ENCOUNTER — Encounter (HOSPITAL_COMMUNITY): Payer: Self-pay

## 2017-10-16 ENCOUNTER — Emergency Department (HOSPITAL_COMMUNITY)
Admission: EM | Admit: 2017-10-16 | Discharge: 2017-10-16 | Disposition: A | Discharge status: Home / Self Care | Payer: Self-pay | Attending: Emergency Medicine | Admitting: Emergency Medicine

## 2017-10-16 DIAGNOSIS — B373 Candidiasis of vulva and vagina: Secondary | ICD-10-CM | POA: Insufficient documentation

## 2017-10-16 DIAGNOSIS — R3915 Urgency of urination: Secondary | ICD-10-CM | POA: Insufficient documentation

## 2017-10-16 DIAGNOSIS — R309 Painful micturition, unspecified: Secondary | ICD-10-CM | POA: Insufficient documentation

## 2017-10-16 DIAGNOSIS — Z79899 Other long term (current) drug therapy: Secondary | ICD-10-CM | POA: Insufficient documentation

## 2017-10-16 DIAGNOSIS — Z91040 Latex allergy status: Secondary | ICD-10-CM | POA: Insufficient documentation

## 2017-10-16 DIAGNOSIS — F1210 Cannabis abuse, uncomplicated: Secondary | ICD-10-CM | POA: Insufficient documentation

## 2017-10-16 DIAGNOSIS — B3731 Acute candidiasis of vulva and vagina: Secondary | ICD-10-CM

## 2017-10-16 DIAGNOSIS — N390 Urinary tract infection, site not specified: Secondary | ICD-10-CM | POA: Insufficient documentation

## 2017-10-16 DIAGNOSIS — M549 Dorsalgia, unspecified: Secondary | ICD-10-CM | POA: Insufficient documentation

## 2017-10-16 DIAGNOSIS — F17210 Nicotine dependence, cigarettes, uncomplicated: Secondary | ICD-10-CM | POA: Insufficient documentation

## 2017-10-16 LAB — URINALYSIS, ROUTINE W REFLEX MICROSCOPIC
Bilirubin Urine: NEGATIVE
Glucose, UA: NEGATIVE mg/dL
Ketones, ur: 5 mg/dL — AB
Nitrite: NEGATIVE
PROTEIN: 30 mg/dL — AB
Specific Gravity, Urine: 1.012 (ref 1.005–1.030)
pH: 7 (ref 5.0–8.0)

## 2017-10-16 LAB — POC URINE PREG, ED: PREG TEST UR: NEGATIVE

## 2017-10-16 MED ORDER — FLUCONAZOLE 100 MG PO TABS
200.0000 mg | ORAL_TABLET | Freq: Once | ORAL | Status: AC
  Administered 2017-10-16: 200 mg via ORAL
  Filled 2017-10-16: qty 2

## 2017-10-16 MED ORDER — CEPHALEXIN 500 MG PO CAPS
500.0000 mg | ORAL_CAPSULE | Freq: Once | ORAL | Status: AC
  Administered 2017-10-16: 500 mg via ORAL
  Filled 2017-10-16: qty 1

## 2017-10-16 MED ORDER — ONDANSETRON HCL 4 MG PO TABS
4.0000 mg | ORAL_TABLET | Freq: Once | ORAL | Status: AC
  Administered 2017-10-16: 4 mg via ORAL
  Filled 2017-10-16: qty 1

## 2017-10-16 MED ORDER — CEPHALEXIN 500 MG PO CAPS: 500.0000 mg | ORAL_CAPSULE | Freq: Four times a day (QID) | ORAL | 0 refills | Status: DC

## 2017-10-16 NOTE — ED Triage Notes (Signed)
Pt c/o burning on urination for 4-5 days. States she initially thought it was a yeast infection so she took a 1 day monistat with no relief. Denies hematuria and fever. No back pain.

## 2017-10-16 NOTE — ED Provider Notes (Signed)
[facility] EMERGENCY DEPARTMENT Provider Note   CSN: [id number] Arrival date & time: 10/16/17  0830     History   Chief Complaint Chief Complaint  Patient presents with  . Dysuria    HPI Sophia Garcia is a 32 y.o. female.  Patient is a 32 year old female who presents to the emergency department with a complaint of dysuria.  The patient states that over the past for 5 days she has been having increase in urine urgency, as well as increase burning with urination.  She noted small amount of brownish discharge for short period of time, but states that this has resolved.  She thought she may have a yeast infection, used some Monistat, but states that this is not relieved her other symptoms.  She is not seen any blood in her urine.  She has not had high fever.  She says she has some back pain, but she is not sure if it is related to the change in her urination or not.  She presents now for assistance with this issue.      Past Medical History:  Diagnosis Date  . BV (bacterial vaginosis)   . HPV (human papilloma virus) infection   . Normal pregnancy, incidental 12/23/2011  . Preterm labor   . Yeast infection     Patient Active Problem List   Diagnosis Date Noted  . Erroneous encounter - disregard 08/03/2012  . Anemia 06/14/2012  . Status post repeat low transverse cesarean section/BTL 06/13/2012  . Tobacco abuse 12/23/2011  . Substance abuse complicating pregnancy, antepartum 12/23/2011    Past Surgical History:  Procedure Laterality Date  . CESAREAN SECTION    . CESAREAN SECTION  06/12/2012   Procedure: CESAREAN SECTION;  Surgeon: Kirkland Hun, MD;  Location: [facility];  Service: Obstetrics;  Laterality: N/A;  Repeat  . CHOLECYSTECTOMY N/A 01/03/2014   Procedure: LAPAROSCOPIC CHOLECYSTECTOMY;  Surgeon: Dalia Heading, MD;  Location: [facility];  Service: General;  Laterality: N/A;  . FOOT SURGERY  2002   STEPPED ON SEWING NEEDLE  . TOOTH EXTRACTION  2009   ONE TOOTH  .  TUBAL LIGATION      OB History    Gravida Para Term Preterm AB Living   2 2 2     2    SAB TAB Ectopic Multiple Live Births           2       Home Medications    Prior to Admission medications   Medication Sig Start Date End Date Taking? Authorizing Provider  acetaminophen (TYLENOL) 500 MG tablet Take 500 mg by mouth every 6 (six) hours as needed.    [provider]  cephALEXin (KEFLEX) 500 MG capsule Take 1 capsule (500 mg total) by mouth 3 (three) times daily. 05/15/17   Raeford Razor, MD  ibuprofen (ADVIL,MOTRIN) 200 MG tablet Take 200 mg by mouth every 6 (six) hours as needed.    [provider]  methocarbamol (ROBAXIN) 500 MG tablet Take 1 tablet (500 mg total) by mouth 2 (two) times daily. 04/16/17   Elson Areas, PA-C  traMADol (ULTRAM) 50 MG tablet Take 1 tablet (50 mg total) by mouth every 6 (six) hours as needed. 05/15/17   Raeford Razor, MD    Family History Family History  Problem Relation Age of Onset  . Diabetes Father        ORAL MEDS  . Cancer Paternal Aunt        BREAST;LIVER  . Diabetes  Paternal Aunt   . Diabetes Paternal Uncle   . COPD Paternal Uncle   . Cancer Maternal Grandmother        BREAST  . COPD Mother        EMPHYSEMA  . Diabetes Paternal Grandfather   . Other Neg Hx     Social History Social History   Tobacco Use  . Smoking status: Current Every Day Smoker    Packs/day: 0.50    Years: 12.00    Pack years: 6.00    Types: Cigarettes  . Smokeless tobacco: Never Used  . Tobacco comment: 1 pack lasts 4 days  Substance Use Topics  . Alcohol use: Yes    Comment: occasionally  . Drug use: Yes    Types: Marijuana    Comment: occasionally     Allergies   Latex; Baby oil; and Morphine and related   Review of Systems Review of Systems  Constitutional: Negative for activity change.       All ROS Neg except as noted in HPI  HENT: Negative for nosebleeds.   Eyes: Negative for photophobia and discharge.    Respiratory: Negative for cough, shortness of breath and wheezing.   Cardiovascular: Negative for chest pain and palpitations.  Gastrointestinal: Negative for abdominal pain and blood in stool.  Genitourinary: Positive for dysuria and vaginal discharge. Negative for frequency and hematuria.  Musculoskeletal: Negative for arthralgias, back pain and neck pain.  Skin: Negative.   Neurological: Negative for dizziness, seizures and speech difficulty.  Psychiatric/Behavioral: Negative for confusion and hallucinations.     Physical Exam Updated Vital Signs BP 111/70 (BP Location: Right Arm)   Pulse 96   Temp 97.6 F (36.4 C) (Oral)   Resp 18   Ht 4\' 11"  (1.499 m)   Wt 43.5 kg (96 lb)   LMP 10/11/2017   SpO2 100%   BMI 19.39 kg/m   Physical Exam  Constitutional: She is oriented to person, place, and time. She appears well-developed and well-nourished.  Non-toxic appearance.  HENT:  Head: Normocephalic.  Right Ear: Tympanic membrane and external ear normal.  Left Ear: Tympanic membrane and external ear normal.  Eyes: EOM and lids are normal. Pupils are equal, round, and reactive to light.  Neck: Normal range of motion. Neck supple. Carotid bruit is not present.  Cardiovascular: Normal rate, regular rhythm, normal heart sounds, intact distal pulses and normal pulses.  Pulmonary/Chest: Breath sounds normal. No respiratory distress.  Abdominal: Soft. Bowel sounds are normal. There is tenderness in the suprapubic area. There is no guarding.  No CVA tenderness noted.  Musculoskeletal: Normal range of motion.  Lymphadenopathy:       Head (right side): No submandibular adenopathy present.       Head (left side): No submandibular adenopathy present.    She has no cervical adenopathy.  Neurological: She is alert and oriented to person, place, and time. She has normal strength. No cranial nerve deficit or sensory deficit.  Skin: Skin is warm and dry.  Psychiatric: She has a normal mood and  affect. Her speech is normal.  Nursing note and vitals reviewed.    ED Treatments / Results  Labs (all labs ordered are listed, but only abnormal results are displayed) Labs Reviewed  URINALYSIS, ROUTINE W REFLEX MICROSCOPIC - Abnormal; Notable for the following components:      Result Value   APPearance CLOUDY (*)    Hgb urine dipstick MODERATE (*)    Ketones, ur 5 (*)    Protein,  ur 30 (*)    Leukocytes, UA MODERATE (*)    Bacteria, UA MANY (*)    Squamous Epithelial / LPF 0-5 (*)    All other components within normal limits  POC URINE PREG, ED    EKG  EKG Interpretation None       Radiology No results found.  Procedures Procedures (including critical care time)  Medications Ordered in ED Medications - No data to display   Initial Impression / Assessment and Plan / ED Course  I have reviewed the triage vital signs and the nursing notes.  Pertinent labs & imaging results that were available during my care of the patient were reviewed by me and considered in my medical decision making (see chart for details).       Final Clinical Impressions(s) / ED Diagnoses MDM  Vital signs within normal limits.  Urine analysis suggest a urinary tract infection.  Urine pregnancy test is negative.  There is no evidence for pyelonephritis at this time.  A culture of the urine has been sent to the lab.  Patient will be treated with Keflex and Diflucan here in the emergency department.  Prescription for Keflex given to the patient.  I have asked the patient to see the physicians at the local health department for the urine to be rechecked in 7-10 days.   Final diagnoses:  Lower urinary tract infectious disease  Vaginal yeast infection    ED Discharge Orders        Ordered    cephALEXin (KEFLEX) 500 MG capsule  4 times daily     10/16/17 1048       Ivery QualeBryant, Dorna Mallet, PA-C 10/16/17 1613    Bethann BerkshireZammit, Joseph, MD 10/17/17 [phone]

## 2017-10-16 NOTE — ED Notes (Signed)
Urine pregnancy negative  

## 2017-10-16 NOTE — Discharge Instructions (Signed)
Please increase fluids.  Please use Keflex with breakfast, lunch, dinner, and at bedtime.  It is important that you take all of the medication as prescribed.  Please see your primary physician or your local health department for recheck in 7-10 days to ensure that this infection has completely resolved.  You were treated in the emergency department with Diflucan to cover your yeast infection.

## 2017-10-19 LAB — URINE CULTURE
Culture: >=100000 — AB
Special Requests: NORMAL

## 2017-10-20 ENCOUNTER — Telehealth: Payer: Self-pay | Admitting: Emergency Medicine

## 2017-10-20 NOTE — Telephone Encounter (Signed)
Post ED Visit - Positive Culture Follow-up  Culture report reviewed by antimicrobial stewardship pharmacist:  []  Enzo BiNathan Batchelder, Pharm.D. [x]  Celedonio MiyamotoJeremy Frens, Pharm.D., BCPS AQ-ID []  Garvin FilaMike Maccia, Pharm.D., BCPS []  Georgina PillionElizabeth Martin, [address]D., BCPS []  Compo[place]h Pham, [place]., BCPS, AAHIVP []  Estella HuskMichelle Turner, Pharm.D., BCPS, AAHIVP []  Lysle Pearlachel Rumbarger, PharmD, BCPS []  Blake DivineShannon Parkey, PharmD []  Pollyann SamplesAndy Johnston, PharmD, BCPS  Positive urine culture Treated with cephalexin, organism sensitive to the same and no further patient follow-up is required at this time.  Berle MullMiller, Naleah Kofoed 10/20/2017, 11:40 AM

## 2017-12-15 ENCOUNTER — Encounter (HOSPITAL_COMMUNITY): Payer: Self-pay | Admitting: Emergency Medicine

## 2017-12-15 ENCOUNTER — Emergency Department (HOSPITAL_COMMUNITY)
Admission: EM | Admit: 2017-12-15 | Discharge: 2017-12-15 | Disposition: A | Discharge status: Home / Self Care | Payer: Self-pay | Attending: Emergency Medicine | Admitting: Emergency Medicine

## 2017-12-15 ENCOUNTER — Emergency Department (HOSPITAL_COMMUNITY): Payer: Self-pay

## 2017-12-15 DIAGNOSIS — R300 Dysuria: Secondary | ICD-10-CM | POA: Insufficient documentation

## 2017-12-15 DIAGNOSIS — R102 Pelvic and perineal pain: Secondary | ICD-10-CM | POA: Insufficient documentation

## 2017-12-15 DIAGNOSIS — B9689 Other specified bacterial agents as the cause of diseases classified elsewhere: Secondary | ICD-10-CM | POA: Insufficient documentation

## 2017-12-15 DIAGNOSIS — N760 Acute vaginitis: Secondary | ICD-10-CM | POA: Insufficient documentation

## 2017-12-15 DIAGNOSIS — Z91040 Latex allergy status: Secondary | ICD-10-CM | POA: Insufficient documentation

## 2017-12-15 DIAGNOSIS — F17210 Nicotine dependence, cigarettes, uncomplicated: Secondary | ICD-10-CM | POA: Insufficient documentation

## 2017-12-15 DIAGNOSIS — N72 Inflammatory disease of cervix uteri: Secondary | ICD-10-CM | POA: Insufficient documentation

## 2017-12-15 LAB — WET PREP, GENITAL
SPERM: NONE SEEN
Trich, Wet Prep: NONE SEEN
YEAST WET PREP: NONE SEEN

## 2017-12-15 LAB — URINALYSIS, ROUTINE W REFLEX MICROSCOPIC
Bacteria, UA: NONE SEEN
Bilirubin Urine: NEGATIVE
GLUCOSE, UA: NEGATIVE mg/dL
Ketones, ur: NEGATIVE mg/dL
Leukocytes, UA: NEGATIVE
NITRITE: NEGATIVE
Protein, ur: NEGATIVE mg/dL
SPECIFIC GRAVITY, URINE: 1.021 (ref 1.005–1.030)
pH: 5 (ref 5.0–8.0)

## 2017-12-15 LAB — PREGNANCY, URINE: Preg Test, Ur: NEGATIVE

## 2017-12-15 MED ORDER — METRONIDAZOLE 500 MG PO TABS: 500.0000 mg | ORAL_TABLET | Freq: Two times a day (BID) | ORAL | 0 refills | Status: DC

## 2017-12-15 MED ORDER — AZITHROMYCIN 250 MG PO TABS
1000.0000 mg | ORAL_TABLET | Freq: Once | ORAL | Status: AC
  Administered 2017-12-15: 1000 mg via ORAL
  Filled 2017-12-15: qty 4

## 2017-12-15 MED ORDER — CEFTRIAXONE SODIUM 250 MG IJ SOLR
250.0000 mg | Freq: Once | INTRAMUSCULAR | Status: AC
  Administered 2017-12-15: 250 mg via INTRAMUSCULAR
  Filled 2017-12-15: qty 250

## 2017-12-15 MED ORDER — LIDOCAINE HCL (PF) 1 % IJ SOLN
INTRAMUSCULAR | Status: AC
  Administered 2017-12-15: 0.9 mL
  Filled 2017-12-15: qty 2

## 2017-12-15 MED ORDER — ACETAMINOPHEN 500 MG PO TABS
1000.0000 mg | ORAL_TABLET | Freq: Once | ORAL | Status: AC
  Administered 2017-12-15: 1000 mg via ORAL
  Filled 2017-12-15: qty 2

## 2017-12-15 NOTE — Discharge Instructions (Addendum)
Follow-up with local physician/clinician for culture results that were obtained today. Take Tylenol and/or Motrin as needed for pain.

## 2017-12-15 NOTE — ED Provider Notes (Signed)
[facility] EMERGENCY DEPARTMENT Provider Note   CSN: [id number] Arrival date & time: 12/15/17  4098     History   Chief Complaint Chief Complaint  Patient presents with  . Dysuria    HPI Sophia Garcia is a 32 y.o. female.  Pt with vaginal discharge and frequency, mild pelvic pain, hx of ovarian cysts, no new sexual partners, sxs for 3 days. Smoker     Past Medical History:  Diagnosis Date  . BV (bacterial vaginosis)   . HPV (human papilloma virus) infection   . Normal pregnancy, incidental 12/23/2011  . Preterm labor   . Yeast infection     Patient Active Problem List   Diagnosis Date Noted  . Erroneous encounter - disregard 08/03/2012  . Anemia 06/14/2012  . Status post repeat low transverse cesarean section/BTL 06/13/2012  . Tobacco abuse 12/23/2011  . Substance abuse complicating pregnancy, antepartum 12/23/2011    Past Surgical History:  Procedure Laterality Date  . CESAREAN SECTION    . CESAREAN SECTION  06/12/2012   Procedure: CESAREAN SECTION;  Surgeon: Kirkland Hun, MD;  Location: [facility];  Service: Obstetrics;  Laterality: N/A;  Repeat  . CHOLECYSTECTOMY N/A 01/03/2014   Procedure: LAPAROSCOPIC CHOLECYSTECTOMY;  Surgeon: Dalia Heading, MD;  Location: [facility];  Service: General;  Laterality: N/A;  . FOOT SURGERY  2002   STEPPED ON SEWING NEEDLE  . TOOTH EXTRACTION  2009   ONE TOOTH  . TUBAL LIGATION       OB History    Gravida  2   Para  2   Term  2   Preterm      AB      Living  2     SAB      TAB      Ectopic      Multiple      Live Births  2            Home Medications    Prior to Admission medications   Medication Sig Start Date End Date Taking? Authorizing Provider  cephALEXin (KEFLEX) 500 MG capsule Take 1 capsule (500 mg total) by mouth 4 (four) times daily. 10/16/17   Ivery Quale, PA-C  metroNIDAZOLE (FLAGYL) 500 MG tablet Take 1 tablet (500 mg total) by mouth 2 (two) times daily. One po bid x 7 days  12/15/17   Blane Ohara, MD  traMADol (ULTRAM) 50 MG tablet Take 1 tablet (50 mg total) by mouth every 6 (six) hours as needed. 05/15/17   Raeford Razor, MD    Family History Family History  Problem Relation Age of Onset  . Diabetes Father        ORAL MEDS  . Cancer Paternal Aunt        BREAST;LIVER  . Diabetes Paternal Aunt   . Diabetes Paternal Uncle   . COPD Paternal Uncle   . Cancer Maternal Grandmother        BREAST  . COPD Mother        EMPHYSEMA  . Diabetes Paternal Grandfather   . Other Neg Hx     Social History Social History   Tobacco Use  . Smoking status: Current Every Day Smoker    Packs/day: 0.50    Years: 12.00    Pack years: 6.00    Types: Cigarettes  . Smokeless tobacco: Never Used  . Tobacco comment: 1 pack lasts 4 days  Substance Use Topics  . Alcohol use: Yes    Comment: occasionally  .  Drug use: Yes    Types: Marijuana    Comment: occasionally     Allergies   Latex; Baby oil; and Morphine and related   Review of Systems Review of Systems  Constitutional: Negative for chills and fever.  HENT: Negative for congestion.   Eyes: Negative for visual disturbance.  Respiratory: Negative for shortness of breath.   Cardiovascular: Negative for chest pain.  Gastrointestinal: Negative for abdominal pain and vomiting.  Genitourinary: Positive for dysuria, frequency and vaginal discharge. Negative for flank pain.  Musculoskeletal: Negative for back pain, neck pain and neck stiffness.  Skin: Negative for rash.  Neurological: Negative for light-headedness and headaches.     Physical Exam Updated Vital Signs BP 105/67 (BP Location: Left Arm)   Pulse 76   Temp 97.8 F (36.6 C) (Oral)   Resp 16   Ht  (1.499 m)   Wt 45.8 kg (101 lb)   LMP 12/01/2017   SpO2 99%   BMI 20.40 kg/m   Physical Exam  Constitutional: She is oriented to person, place, and time. She appears well-developed and well-nourished.  HENT:  Head: Normocephalic and  atraumatic.  Eyes: Conjunctivae are normal. Right eye exhibits no discharge. Left eye exhibits no discharge.  Neck: Normal range of motion. Neck supple. No tracheal deviation present.  Cardiovascular: Normal rate.  Pulmonary/Chest: Effort normal.  Abdominal: Soft. She exhibits no distension. There is tenderness (mild suprapubic). There is no guarding.  Genitourinary:  Genitourinary Comments: Mild CMT and vaginal dc  Musculoskeletal: She exhibits no edema.  Neurological: She is alert and oriented to person, place, and time.  Skin: Skin is warm. No rash noted.  Psychiatric: She has a normal mood and affect.  Nursing note and vitals reviewed.    ED Treatments / Results  Labs (all labs ordered are listed, but only abnormal results are displayed) Labs Reviewed  WET PREP, GENITAL - Abnormal; Notable for the following components:      Result Value   Clue Cells Wet Prep HPF POC PRESENT (*)    WBC, Wet Prep HPF POC RARE (*)    All other components within normal limits  URINALYSIS, ROUTINE W REFLEX MICROSCOPIC - Abnormal; Notable for the following components:   APPearance HAZY (*)    Hgb urine dipstick SMALL (*)    All other components within normal limits  URINE CULTURE  PREGNANCY, URINE  GC/CHLAMYDIA PROBE AMP ([place]) NOT AT [facility]    EKG None  Radiology US Transvaginal Non-ob  Result Date: 12/15/2017 CLINICAL DATA:  Left-sided pelvic pain. EXAM: TRANSABDOMINAL AND TRANSVAGINAL ULTRASOUND OF PELVIS DOPPLER ULTRASOUND OF OVARIES TECHNIQUE: Both transabdominal and transvaginal ultrasound examinations of the pelvis were performed. Transabdominal technique was performed for global imaging of the pelvis including uterus, ovaries, adnexal regions, and pelvic cul-de-sac. It was necessary to proceed with endovaginal exam following the transabdominal exam to visualize the uterus, endometrium, and ovaries. Color and duplex Doppler ultrasound was utilized to evaluate blood flow to the  ovaries. COMPARISON:  None. FINDINGS: Uterus Measurements: 8.9 x 4.4 x 5.1 cm. No fibroids or other mass visualized. Endometrium Thickness: 14 mm.  No focal abnormality visualized. Right ovary Measurements: 4.0 x 1.6 x 3.9 cm. Normal appearance/no adnexal mass. Left ovary Measurements: 3.4 x 1.6 x 3.4 cm. Normal appearance/no adnexal mass. Pulsed Doppler evaluation of both ovaries demonstrates normal low-resistance arterial and venous waveforms. Other findings Trace free fluid, likely physiologic. IMPRESSION: Normal pelvic ultrasound. Electronically Signed   By: Vickki Hearing.D.  On: 12/15/2017 09:46   US Pelvis Complete  Result Date: 12/15/2017 CLINICAL DATA:  Left-sided pelvic pain. EXAM: TRANSABDOMINAL AND TRANSVAGINAL ULTRASOUND OF PELVIS DOPPLER ULTRASOUND OF OVARIES TECHNIQUE: Both transabdominal and transvaginal ultrasound examinations of the pelvis were performed. Transabdominal technique was performed for global imaging of the pelvis including uterus, ovaries, adnexal regions, and pelvic cul-de-sac. It was necessary to proceed with endovaginal exam following the transabdominal exam to visualize the uterus, endometrium, and ovaries. Color and duplex Doppler ultrasound was utilized to evaluate blood flow to the ovaries. COMPARISON:  None. FINDINGS: Uterus Measurements: 8.9 x 4.4 x 5.1 cm. No fibroids or other mass visualized. Endometrium Thickness: 14 mm.  No focal abnormality visualized. Right ovary Measurements: 4.0 x 1.6 x 3.9 cm. Normal appearance/no adnexal mass. Left ovary Measurements: 3.4 x 1.6 x 3.4 cm. Normal appearance/no adnexal mass. Pulsed Doppler evaluation of both ovaries demonstrates normal low-resistance arterial and venous waveforms. Other findings Trace free fluid, likely physiologic. IMPRESSION: Normal pelvic ultrasound. Electronically Signed   By: Obie Dredge M.D.   On: 12/15/2017 09:46   [place] Art/ven Flow Abd Pelv Doppler  Result Date: 12/15/2017 CLINICAL DATA:   Left-sided pelvic pain. EXAM: TRANSABDOMINAL AND TRANSVAGINAL ULTRASOUND OF PELVIS DOPPLER ULTRASOUND OF OVARIES TECHNIQUE: Both transabdominal and transvaginal ultrasound examinations of the pelvis were performed. Transabdominal technique was performed for global imaging of the pelvis including uterus, ovaries, adnexal regions, and pelvic cul-de-sac. It was necessary to proceed with endovaginal exam following the transabdominal exam to visualize the uterus, endometrium, and ovaries. Color and duplex Doppler ultrasound was utilized to evaluate blood flow to the ovaries. COMPARISON:  None. FINDINGS: Uterus Measurements: 8.9 x 4.4 x 5.1 cm. No fibroids or other mass visualized. Endometrium Thickness: 14 mm.  No focal abnormality visualized. Right ovary Measurements: 4.0 x 1.6 x 3.9 cm. Normal appearance/no adnexal mass. Left ovary Measurements: 3.4 x 1.6 x 3.4 cm. Normal appearance/no adnexal mass. Pulsed Doppler evaluation of both ovaries demonstrates normal low-resistance arterial and venous waveforms. Other findings Trace free fluid, likely physiologic. IMPRESSION: Normal pelvic ultrasound. Electronically Signed   By: Obie Dredge M.D.   On: 12/15/2017 09:46    Procedures Procedures (including critical care time)  Medications Ordered in ED Medications  acetaminophen (TYLENOL) tablet 1,000 mg (1,000 mg Oral Given 12/15/17 0936)  cefTRIAXone (ROCEPHIN) injection 250 mg (250 mg Intramuscular Given 12/15/17 0937)  azithromycin (ZITHROMAX) tablet 1,000 mg (1,000 mg Oral Given 12/15/17 0936)  lidocaine (PF) (XYLOCAINE) 1 % injection (0.9 mLs  Given 12/15/17 6962)     Initial Impression / Assessment and Plan / ED Course  I have reviewed the triage vital signs and the nursing notes.  Pertinent labs & imaging results that were available during my care of the patient were reviewed by me and considered in my medical decision making (see chart for details).    Pt with concern for cervicitis and BV. Clue  cells pos.  UA reviewed. Abx prophylaxis given, script for metron Fup outpt    Final Clinical Impressions(s) / ED Diagnoses   Final diagnoses:  BV (bacterial vaginosis)  Cervicitis    ED Discharge Orders        Ordered    metroNIDAZOLE (FLAGYL) 500 MG tablet  2 times daily     12/15/17 1010       Blane Ohara, MD 12/15/17 1612

## 2017-12-15 NOTE — ED Triage Notes (Signed)
Pt reports burning with urination, frequency, urgency, and white vaginal discharge with odor.  Started 3 days ago.  Pt talking on phone while being triaged.

## 2017-12-16 LAB — URINE CULTURE: Culture: 40000 — AB

## 2017-12-16 LAB — GC/CHLAMYDIA PROBE AMP (~~LOC~~) NOT AT ARMC
Chlamydia: NEGATIVE
Neisseria Gonorrhea: NEGATIVE

## 2017-12-17 ENCOUNTER — Telehealth: Payer: Self-pay | Admitting: Emergency Medicine

## 2017-12-17 NOTE — Telephone Encounter (Signed)
Post ED Visit - Positive Culture Follow-up  Culture report reviewed by antimicrobial stewardship pharmacist:   Enzo Bi, Pharm.D.  Celedonio Miyamoto, Pharm.D., BCPS AQ-ID  Garvin Fila, Pharm.D., BCPS  Georgina Pillion, [address].D., BCPS  [place], [address].D., BCPS, AAHIVP  Estella Husk, Pharm.D., BCPS, AAHIVP  Lysle Pearl, PharmD, BCPS  Blake Divine, PharmD  Pollyann Samples, PharmD, BCPS  Positive urine culture Treated with flagyl for BV, low colony count, asymptomatic, no further patient follow-up is required at this time.  Berle Mull 12/17/2017, 1:52 PM

## 2018-05-10 ENCOUNTER — Other Ambulatory Visit: Payer: Self-pay

## 2018-05-10 ENCOUNTER — Emergency Department (HOSPITAL_COMMUNITY)
Admission: EM | Admit: 2018-05-10 | Discharge: 2018-05-10 | Disposition: A | Discharge status: Home / Self Care | Payer: Self-pay | Attending: Emergency Medicine | Admitting: Emergency Medicine

## 2018-05-10 ENCOUNTER — Encounter (HOSPITAL_COMMUNITY): Payer: Self-pay | Admitting: Emergency Medicine

## 2018-05-10 DIAGNOSIS — N760 Acute vaginitis: Secondary | ICD-10-CM | POA: Insufficient documentation

## 2018-05-10 DIAGNOSIS — F17210 Nicotine dependence, cigarettes, uncomplicated: Secondary | ICD-10-CM | POA: Insufficient documentation

## 2018-05-10 DIAGNOSIS — B9689 Other specified bacterial agents as the cause of diseases classified elsewhere: Secondary | ICD-10-CM | POA: Insufficient documentation

## 2018-05-10 LAB — URINALYSIS, ROUTINE W REFLEX MICROSCOPIC
Bilirubin Urine: NEGATIVE
GLUCOSE, UA: NEGATIVE mg/dL
Hgb urine dipstick: NEGATIVE
Ketones, ur: NEGATIVE mg/dL
LEUKOCYTES UA: NEGATIVE
Nitrite: NEGATIVE
PH: 8 (ref 5.0–8.0)
Protein, ur: NEGATIVE mg/dL
Specific Gravity, Urine: 1.008 (ref 1.005–1.030)

## 2018-05-10 LAB — WET PREP, GENITAL
SPERM: NONE SEEN
Trich, Wet Prep: NONE SEEN
Yeast Wet Prep HPF POC: NONE SEEN

## 2018-05-10 LAB — PREGNANCY, URINE: Preg Test, Ur: NEGATIVE

## 2018-05-10 MED ORDER — CEFTRIAXONE SODIUM 250 MG IJ SOLR
250.0000 mg | Freq: Once | INTRAMUSCULAR | Status: AC
  Administered 2018-05-10: 250 mg via INTRAMUSCULAR
  Filled 2018-05-10: qty 250

## 2018-05-10 MED ORDER — LIDOCAINE HCL (PF) 1 % IJ SOLN
INTRAMUSCULAR | Status: AC
  Administered 2018-05-10: 12:00:00
  Filled 2018-05-10: qty 2

## 2018-05-10 MED ORDER — METRONIDAZOLE 500 MG PO TABS: 500.0000 mg | ORAL_TABLET | Freq: Two times a day (BID) | ORAL | 0 refills | Status: DC

## 2018-05-10 MED ORDER — AZITHROMYCIN 250 MG PO TABS
1000.0000 mg | ORAL_TABLET | Freq: Once | ORAL | Status: AC
  Administered 2018-05-10: 1000 mg via ORAL
  Filled 2018-05-10: qty 4

## 2018-05-10 NOTE — Discharge Instructions (Signed)
Take the vaginal tablet, 1 tablet every 12 hours for the next 7 days, this will treat your bacterial vaginal infection, if you have an STD you will get a phone call within the next 48 hours to let you know.  We have Artie treated you for potential STD infections.  Do not have a urinary infection

## 2018-05-10 NOTE — ED Triage Notes (Signed)
Pt reports urinary frequency, dysuria x1 week. Pt reports abd swelling and cramping x2 days. Pt denies fever but reports intermittent nausea.

## 2018-05-10 NOTE — ED Provider Notes (Signed)
[facility] EMERGENCY DEPARTMENT Provider Note   CSN: [id number] Arrival date & time: 05/10/18  1000     History   Chief Complaint Chief Complaint  Patient presents with  . Urinary Frequency    HPI Sophia Garcia is a 32 y.o. female.  HPI  32 year old female, history of prior yeast infection, prior bacterial vaginosis, prior urinary tract infections, states that she is sexually active but has had her tubes tied, she has not had her menstrual cycle recently but has been regular without missing any.  She has had several days of increasing discomfort with urination, states that it is a burning sensation itching sensation, no vaginal bleeding, no pain with intercourse, she does have increased amounts of vaginal discharge, she also has some lower back discomfort and pelvic discomfort.  No fevers or chills, symptoms are intermittent, she has been trying to "flush my system out" with lots of fluids but states that her symptoms persist despite that.  Past Medical History:  Diagnosis Date  . BV (bacterial vaginosis)   . HPV (human papilloma virus) infection   . Normal pregnancy, incidental 12/23/2011  . Preterm labor   . Yeast infection     Patient Active Problem List   Diagnosis Date Noted  . Erroneous encounter - disregard 08/03/2012  . Anemia 06/14/2012  . Status post repeat low transverse cesarean section/BTL 06/13/2012  . Tobacco abuse 12/23/2011  . Substance abuse complicating pregnancy, antepartum 12/23/2011    Past Surgical History:  Procedure Laterality Date  . CESAREAN SECTION    . CESAREAN SECTION  06/12/2012   Procedure: CESAREAN SECTION;  Surgeon: Kirkland Hun, MD;  Location: [facility];  Service: Obstetrics;  Laterality: N/A;  Repeat  . CHOLECYSTECTOMY N/A 01/03/2014   Procedure: LAPAROSCOPIC CHOLECYSTECTOMY;  Surgeon: Dalia Heading, MD;  Location: [facility];  Service: General;  Laterality: N/A;  . FOOT SURGERY  2002   STEPPED ON SEWING NEEDLE  . TOOTH EXTRACTION   2009   ONE TOOTH  . TUBAL LIGATION       OB History    Gravida  2   Para  2   Term  2   Preterm      AB      Living  2     SAB      TAB      Ectopic      Multiple      Live Births  2            Home Medications    Prior to Admission medications   Medication Sig Start Date End Date Taking? Authorizing Provider  metroNIDAZOLE (FLAGYL) 500 MG tablet Take 1 tablet (500 mg total) by mouth 2 (two) times daily. 05/10/18   Eber Hong, MD    Family History Family History  Problem Relation Age of Onset  . Diabetes Father        ORAL MEDS  . Cancer Paternal Aunt        BREAST;LIVER  . Diabetes Paternal Aunt   . Diabetes Paternal Uncle   . COPD Paternal Uncle   . Cancer Maternal Grandmother        BREAST  . COPD Mother        EMPHYSEMA  . Diabetes Paternal Grandfather   . Other Neg Hx     Social History Social History   Tobacco Use  . Smoking status: Current Every Day Smoker    Packs/day: 0.50    Years: 12.00  Pack years: 6.00    Types: Cigarettes  . Smokeless tobacco: Never Used  . Tobacco comment: 1 pack lasts 4 days  Substance Use Topics  . Alcohol use: Yes    Comment: occasionally  . Drug use: Yes    Types: Marijuana    Comment: occasionally     Allergies   Latex; Baby oil; and Morphine and related   Review of Systems Review of Systems  Constitutional: Negative for fever.  Respiratory: Negative for cough and shortness of breath.   Cardiovascular: Negative for leg swelling.  Gastrointestinal: Positive for nausea. Negative for blood in stool, constipation, diarrhea and vomiting.  Genitourinary: Positive for difficulty urinating, dysuria, flank pain, pelvic pain and vaginal discharge. Negative for dyspareunia, hematuria, menstrual problem, vaginal bleeding and vaginal pain.     Physical Exam Updated Vital Signs BP 100/84 (BP Location: Right Arm)   Pulse 86   Temp 98 F (36.7 C) (Oral)   Resp 18   Ht 1.499 m (4\' 11" )   Wt  45.8 kg   LMP 04/19/2018   SpO2 100%   BMI 20.40 kg/m   Physical Exam  Constitutional: She appears well-developed and well-nourished. No distress.  HENT:  Head: Normocephalic and atraumatic.  Mouth/Throat: Oropharynx is clear and moist. No oropharyngeal exudate.  Eyes: Pupils are equal, round, and reactive to light. Conjunctivae and EOM are normal. Right eye exhibits no discharge. Left eye exhibits no discharge. No scleral icterus.  Neck: Normal range of motion. Neck supple. No JVD present. No thyromegaly present.  Cardiovascular: Normal rate, regular rhythm, normal heart sounds and intact distal pulses. Exam reveals no gallop and no friction rub.  No murmur heard. Pulmonary/Chest: Effort normal and breath sounds normal. No respiratory distress. She has no wheezes. She has no rales.  Abdominal: Soft. Bowel sounds are normal. She exhibits no distension and no mass. There is tenderness ( Bilateral lower abdominal tenderness without guarding or peritoneal signs, she is not focal to McBurney's point).  Genitourinary:  Genitourinary Comments: No CVA tenderness, chaperone present for exam, Normal-appearing external genitalia, normal-appearing vagina with no blood, no foreign bodies, no foul odor, there is a thicker creamy discharge, cervix is mildly tender with some right and left adnexal tenderness but no masses palpated.  Musculoskeletal: Normal range of motion. She exhibits no edema or tenderness.  Lymphadenopathy:    She has no cervical adenopathy.  Neurological: She is alert. Coordination normal.  Skin: Skin is warm and dry. No rash noted. No erythema.  Psychiatric: She has a normal mood and affect. Her behavior is normal.  Nursing note and vitals reviewed.    ED Treatments / Results  Labs (all labs ordered are listed, but only abnormal results are displayed) Labs Reviewed  WET PREP, GENITAL - Abnormal; Notable for the following components:      Result Value   Clue Cells Wet Prep  HPF POC PRESENT (*)    WBC, Wet Prep HPF POC MODERATE (*)    All other components within normal limits  URINALYSIS, ROUTINE W REFLEX MICROSCOPIC - Abnormal; Notable for the following components:   Color, Urine STRAW (*)    All other components within normal limits  PREGNANCY, URINE  GC/CHLAMYDIA PROBE AMP ([place]) NOT AT [facility]    EKG None  Radiology No results found.  Procedures Procedures (including critical care time)  Medications Ordered in ED Medications  azithromycin (ZITHROMAX) tablet 1,000 mg (1,000 mg Oral Given 05/10/18 1123)  cefTRIAXone (ROCEPHIN) injection 250 mg (250 mg  Intramuscular Given 05/10/18 1124)  lidocaine (PF) (XYLOCAINE) 1 % injection (  Given 05/10/18 1140)     Initial Impression / Assessment and Plan / ED Course  I have reviewed the triage vital signs and the nursing notes.  Pertinent labs & imaging results that were available during my care of the patient were reviewed by me and considered in my medical decision making (see chart for details).  Clinical Course as of May 10 1236  Wynelle LinkSun May 10, 2018  1205 Analysis is negative, vaginal sample significant for clue cells present, this may all be bacterial vaginosis.  She has been given Rocephin and Zithromax, will discharge with Flagyl.  Patient agreeable.  She is not pregnant.  She has no UTI.   [BM]    Clinical Course User Index [BM] Eber HongMiller, Shadeed Colberg, MD    Check urinalysis for infection, likely has some type of vaginal infection, does not appear to have unilateral tenderness, no fever, doubt tubo-ovarian abscess though would consider PID given tenderness and discharge.  Patient is nonacute, no fever, no tachycardia.  Vitals:   05/10/18 1017 05/10/18 1018  BP: 100/84   Pulse: 86   Resp: 18   Temp: 98 F (36.7 C)   TempSrc: Oral   SpO2: 100%   Weight:  45.8 kg  Height:  1.499 m (4\' 11" )     Final Clinical Impressions(s) / ED Diagnoses   Final diagnoses:  BV (bacterial vaginosis)    ED  Discharge Orders         Ordered    metroNIDAZOLE (FLAGYL) 500 MG tablet  2 times daily,   Status:  Discontinued     05/10/18 1206    metroNIDAZOLE (FLAGYL) 500 MG tablet  2 times daily     05/10/18 1236           Eber HongMiller, Joey Hudock, MD 05/10/18 1237

## 2018-05-11 LAB — GC/CHLAMYDIA PROBE AMP (~~LOC~~) NOT AT ARMC
CHLAMYDIA, DNA PROBE: NEGATIVE
Neisseria Gonorrhea: NEGATIVE

## 2018-06-22 ENCOUNTER — Other Ambulatory Visit: Payer: Self-pay

## 2018-06-22 ENCOUNTER — Encounter (HOSPITAL_COMMUNITY): Payer: Self-pay | Admitting: Emergency Medicine

## 2018-06-22 ENCOUNTER — Emergency Department (HOSPITAL_COMMUNITY)
Admission: EM | Admit: 2018-06-22 | Discharge: 2018-06-23 | Disposition: A | Discharge status: Home / Self Care | Payer: Self-pay | Attending: Emergency Medicine | Admitting: Emergency Medicine

## 2018-06-22 DIAGNOSIS — F17210 Nicotine dependence, cigarettes, uncomplicated: Secondary | ICD-10-CM | POA: Insufficient documentation

## 2018-06-22 DIAGNOSIS — R1013 Epigastric pain: Secondary | ICD-10-CM

## 2018-06-22 DIAGNOSIS — K219 Gastro-esophageal reflux disease without esophagitis: Secondary | ICD-10-CM | POA: Insufficient documentation

## 2018-06-22 LAB — CBC WITH DIFFERENTIAL/PLATELET
Abs Immature Granulocytes: 0.02 10*3/uL (ref 0.00–0.07)
BASOS ABS: 0 10*3/uL (ref 0.0–0.1)
BASOS PCT: 0 %
EOS ABS: 0.2 10*3/uL (ref 0.0–0.5)
Eosinophils Relative: 2 %
HEMATOCRIT: 37.8 % (ref 36.0–46.0)
Hemoglobin: 12.3 g/dL (ref 12.0–15.0)
Immature Granulocytes: 0 %
LYMPHS ABS: 2.7 10*3/uL (ref 0.7–4.0)
Lymphocytes Relative: 27 %
MCH: 29.6 pg (ref 26.0–34.0)
MCHC: 32.5 g/dL (ref 30.0–36.0)
MCV: 91.1 fL (ref 80.0–100.0)
MONOS PCT: 5 %
Monocytes Absolute: 0.5 10*3/uL (ref 0.1–1.0)
NRBC: 0 % (ref 0.0–0.2)
Neutro Abs: 6.5 10*3/uL (ref 1.7–7.7)
Neutrophils Relative %: 66 %
PLATELETS: 412 10*3/uL — AB (ref 150–400)
RBC: 4.15 MIL/uL (ref 3.87–5.11)
RDW: 17.1 % — AB (ref 11.5–15.5)
WBC: 9.9 10*3/uL (ref 4.0–10.5)

## 2018-06-22 MED ORDER — METOCLOPRAMIDE HCL 5 MG/ML IJ SOLN
5.0000 mg | Freq: Once | INTRAMUSCULAR | Status: AC
  Administered 2018-06-22: 5 mg via INTRAVENOUS
  Filled 2018-06-22: qty 2

## 2018-06-22 MED ORDER — FAMOTIDINE IN NACL 20-0.9 MG/50ML-% IV SOLN
20.0000 mg | Freq: Once | INTRAVENOUS | Status: AC
  Administered 2018-06-22: 20 mg via INTRAVENOUS
  Filled 2018-06-22: qty 50

## 2018-06-22 MED ORDER — SODIUM CHLORIDE 0.9 % IV BOLUS
500.0000 mL | Freq: Once | INTRAVENOUS | Status: AC
  Administered 2018-06-22: 500 mL via INTRAVENOUS

## 2018-06-22 MED ORDER — DIPHENHYDRAMINE HCL 50 MG/ML IJ SOLN
12.5000 mg | Freq: Once | INTRAMUSCULAR | Status: AC
  Administered 2018-06-22: 12.5 mg via INTRAVENOUS
  Filled 2018-06-22: qty 1

## 2018-06-22 MED ORDER — SODIUM CHLORIDE 0.9 % IV BOLUS
1000.0000 mL | Freq: Once | INTRAVENOUS | Status: AC
  Administered 2018-06-22: 1000 mL via INTRAVENOUS

## 2018-06-22 MED ORDER — DICYCLOMINE HCL 10 MG/ML IM SOLN
10.0000 mg | Freq: Once | INTRAMUSCULAR | Status: AC
  Administered 2018-06-22: 10 mg via INTRAMUSCULAR
  Filled 2018-06-22: qty 2

## 2018-06-22 NOTE — ED Provider Notes (Signed)
[facility] EMERGENCY DEPARTMENT Provider Note   CSN: [id number] Arrival date & time: 06/22/18  2020  Time seen 23:09 PM   History   Chief Complaint Chief Complaint  Patient presents with  . Hernia    HPI Hadessah L Quin is a 32 y.o. female.  HPI patient had a cholecystectomy in 2015.  It was done at this facility by Dr. Lovell Sheehan.  She states about 6 months after she had her surgery she started having intermittent abdominal discomfort and states that usually in the epigastric area.  She surmises that she has a hernia because she feels like the area gets swollen.  She states it was happening every 2 to 3 months but seems to be getting worse over the past year.  She states she has had 2 episodes in the last 2 weeks.  She states at 5 PM tonight while at work she started getting epigastric abdominal pain and had to leave work after an hour and a half.  She states it is in the epigastric area.  She describes it as a painful, burning, and stretching sensation.  She states standing up, walking, coughing, and sneezing makes the pain worse.  She states being in the fetal position or holding pressure over her abdomen if she coughs or sneezes makes it feel better.  The pain sometimes radiates into her back and sometimes wraps around her upper abdomen but does not go completely around.  She does not relate this pain to food.  She states she does get acid reflux symptoms and states she had it throughout her pregnancy and "nothing helps" so she just ignores it.  She has had nausea today and vomited 2-3 times this evening and has had about 4-5 episodes of diarrhea.  She denies any known fever.  PCP Patient, No Pcp Per   Past Medical History:  Diagnosis Date  . BV (bacterial vaginosis)   . HPV (human papilloma virus) infection   . Normal pregnancy, incidental 12/23/2011  . Preterm labor   . Yeast infection     Patient Active Problem List   Diagnosis Date Noted  . Erroneous encounter - disregard  08/03/2012  . Anemia 06/14/2012  . Status post repeat low transverse cesarean section/BTL 06/13/2012  . Tobacco abuse 12/23/2011  . Substance abuse complicating pregnancy, antepartum 12/23/2011    Past Surgical History:  Procedure Laterality Date  . CESAREAN SECTION    . CESAREAN SECTION  06/12/2012   Procedure: CESAREAN SECTION;  Surgeon: Kirkland Hun, MD;  Location: [facility];  Service: Obstetrics;  Laterality: N/A;  Repeat  . CHOLECYSTECTOMY N/A 01/03/2014   Procedure: LAPAROSCOPIC CHOLECYSTECTOMY;  Surgeon: Dalia Heading, MD;  Location: [facility];  Service: General;  Laterality: N/A;  . FOOT SURGERY  2002   STEPPED ON SEWING NEEDLE  . TOOTH EXTRACTION  2009   ONE TOOTH  . TUBAL LIGATION       OB History    Gravida  2   Para  2   Term  2   Preterm      AB      Living  2     SAB      TAB      Ectopic      Multiple      Live Births  2            Home Medications    Prior to Admission medications   Medication Sig Start Date End Date Taking? Authorizing Provider  ciprofloxacin (  CIPRO) 500 MG tablet Take 1 tablet (500 mg total) by mouth 2 (two) times daily. 06/23/18   Devoria Albe, MD  metroNIDAZOLE (FLAGYL) 500 MG tablet Take 1 tablet (500 mg total) by mouth 3 (three) times daily. 06/23/18   Devoria Albe, MD  omeprazole (PRILOSEC) 20 MG capsule Take 1 po BID x 2 weeks then once a day 06/23/18   Devoria Albe, MD  ondansetron (ZOFRAN) 4 MG tablet Take 1 tablet (4 mg total) by mouth every 8 (eight) hours as needed. 06/23/18   Devoria Albe, MD    Family History Family History  Problem Relation Age of Onset  . Diabetes Father        ORAL MEDS  . Cancer Paternal Aunt        BREAST;LIVER  . Diabetes Paternal Aunt   . Diabetes Paternal Uncle   . COPD Paternal Uncle   . Cancer Maternal Grandmother        BREAST  . COPD Mother        EMPHYSEMA  . Diabetes Paternal Grandfather   . Other Neg Hx     Social History Social History   Tobacco Use  . Smoking status:  Current Every Day Smoker    Packs/day: 0.50    Years: 12.00    Pack years: 6.00    Types: Cigarettes  . Smokeless tobacco: Never Used  . Tobacco comment: 1 pack lasts 4 days  Substance Use Topics  . Alcohol use: Yes    Comment: occasionally  . Drug use: Yes    Types: Marijuana    Comment: occasionally  employed.  Smokes 1/3 ppd   Allergies   Latex; Baby oil; and Morphine and related   Review of Systems Review of Systems  All other systems reviewed and are negative.    Physical Exam Updated Vital Signs BP 108/76   Pulse 72   Temp 98 F (36.7 C) (Oral)   Resp (!) 24   Ht 4\' 11"  (1.499 m)   Wt 45.6 kg   LMP 05/22/2018   SpO2 100%   BMI 20.32 kg/m   Vital signs normal    Physical Exam  Constitutional: She is oriented to person, place, and time.  Non-toxic appearance. She does not appear ill. No distress.  Small, petite female  HENT:  Head: Normocephalic and atraumatic.  Right Ear: External ear normal.  Left Ear: External ear normal.  Nose: Nose normal. No mucosal edema or rhinorrhea.  Mouth/Throat: Oropharynx is clear and moist and mucous membranes are normal. No dental abscesses or uvula swelling.  Eyes: Pupils are equal, round, and reactive to light. Conjunctivae and EOM are normal.  Neck: Normal range of motion and full passive range of motion without pain. Neck supple.  Cardiovascular: Normal rate, regular rhythm and normal heart sounds. Exam reveals no gallop and no friction rub.  No murmur heard. Pulmonary/Chest: Effort normal and breath sounds normal. No respiratory distress. She has no wheezes. She has no rhonchi. She has no rales. She exhibits no tenderness and no crepitus.  Abdominal: Soft. Normal appearance and bowel sounds are normal. She exhibits no distension. There is tenderness. There is no rebound and no guarding.    Very tender in the epigastric area, also has lower abdominal pain that she states is from her ovarian cysts that she knows she  has  Musculoskeletal: Normal range of motion. She exhibits no edema or tenderness.  Moves all extremities well.   Neurological: She is alert and oriented to  person, place, and time. She has normal strength. No cranial nerve deficit.  Skin: Skin is warm, dry and intact. No rash noted. No erythema. No pallor.  Psychiatric: She has a normal mood and affect. Her speech is normal and behavior is normal. Her mood appears not anxious.  Nursing note and vitals reviewed.    ED Treatments / Results  Labs (all labs ordered are listed, but only abnormal results are displayed) Results for orders placed or performed during the hospital encounter of 06/22/18  Comprehensive metabolic panel  Result Value Ref Range   Sodium 135 135 - 145 mmol/L   Potassium 3.7 3.5 - 5.1 mmol/L   Chloride 107 98 - 111 mmol/L   CO2 21 (L) 22 - 32 mmol/L   Glucose, Bld 91 70 - 99 mg/dL   BUN 16 6 - 20 mg/dL   Creatinine, Ser 1.61 0.44 - 1.00 mg/dL   Calcium 8.7 (L) 8.9 - 10.3 mg/dL   Total Protein 6.9 6.5 - 8.1 g/dL   Albumin 4.1 3.5 - 5.0 g/dL   AST 17 15 - 41 U/L   ALT 15 0 - 44 U/L   Alkaline Phosphatase 20 (L) 38 - 126 U/L   Total Bilirubin 0.8 0.3 - 1.2 mg/dL   GFR calc non Af Amer >60 >60 mL/min   GFR calc Af Amer >60 >60 mL/min   Anion gap 7 5 - 15  Lipase, blood  Result Value Ref Range   Lipase 149 (H) 11 - 51 U/L  CBC with Differential  Result Value Ref Range   WBC 9.9 4.0 - 10.5 K/uL   RBC 4.15 3.87 - 5.11 MIL/uL   Hemoglobin 12.3 12.0 - 15.0 g/dL   HCT 09.6 04.5 - 40.9 %   MCV 91.1 80.0 - 100.0 fL   MCH 29.6 26.0 - 34.0 pg   MCHC 32.5 30.0 - 36.0 g/dL   RDW 81.1 (H) 91.4 - 78.2 %   Platelets 412 (H) 150 - 400 K/uL   nRBC 0.0 0.0 - 0.2 %   Neutrophils Relative % 66 %   Neutro Abs 6.5 1.7 - 7.7 K/uL   Lymphocytes Relative 27 %   Lymphs Abs 2.7 0.7 - 4.0 K/uL   Monocytes Relative 5 %   Monocytes Absolute 0.5 0.1 - 1.0 K/uL   Eosinophils Relative 2 %   Eosinophils Absolute 0.2 0.0 - 0.5 K/uL     Basophils Relative 0 %   Basophils Absolute 0.0 0.0 - 0.1 K/uL   Immature Granulocytes 0 %   Abs Immature Granulocytes 0.02 0.00 - 0.07 K/uL  Urinalysis, Routine w reflex microscopic  Result Value Ref Range   Color, Urine YELLOW YELLOW   APPearance HAZY (A) CLEAR   Specific Gravity, Urine 1.019 1.005 - 1.030   pH 6.0 5.0 - 8.0   Glucose, UA NEGATIVE NEGATIVE mg/dL   Hgb urine dipstick LARGE (A) NEGATIVE   Bilirubin Urine NEGATIVE NEGATIVE   Ketones, ur 5 (A) NEGATIVE mg/dL   Protein, ur NEGATIVE NEGATIVE mg/dL   Nitrite NEGATIVE NEGATIVE   Leukocytes, UA NEGATIVE NEGATIVE   RBC / HPF 6-10 0 - 5 RBC/hpf   WBC, UA 0-5 0 - 5 WBC/hpf   Bacteria, UA RARE (A) NONE SEEN   Squamous Epithelial / LPF 0-5 0 - 5   Mucus PRESENT    Laboratory interpretation all normal except elevation of her lipase    EKG None  Radiology Ct Abdomen Pelvis W Contrast  Result Date: 06/23/2018  CLINICAL DATA:  Increasing discomfort with urination. Burning sensation. Pelvic discomfort. EXAM: CT ABDOMEN AND PELVIS WITH CONTRAST TECHNIQUE: Multidetector CT imaging of the abdomen and pelvis was performed using the standard protocol following bolus administration of intravenous contrast. CONTRAST:  30mL ISOVUE-300 IOPAMIDOL (ISOVUE-300) INJECTION 61%, ISOVUE-300 IOPAMIDOL (ISOVUE-300) INJECTION 61% COMPARISON:  None. FINDINGS: Lower chest: Lung bases are clear. Hepatobiliary: No focal liver abnormality is seen. Status post cholecystectomy. No biliary dilatation. Pancreas: Unremarkable. No pancreatic ductal dilatation or surrounding inflammatory changes. Spleen: Normal in size without focal abnormality. Adrenals/Urinary Tract: Adrenal glands are unremarkable. Kidneys are normal, without renal calculi, focal lesion, or hydronephrosis. Bladder is unremarkable. Stomach/Bowel: Stomach is within normal limits. Appendix appears normal. No evidence of small bowel wall thickening, distention, or inflammatory changes. Wall  thickening in the sigmoid colon without adjacent inflammatory changes. This could be due to muscular hypertrophy or focal colitis. Vascular/Lymphatic: No significant vascular findings are present. No enlarged abdominal or pelvic lymph nodes. Reproductive: Uterus and bilateral adnexa are unremarkable. Other: No abdominal wall hernia or abnormality. No abdominopelvic ascites. Musculoskeletal: No acute or significant osseous findings. IMPRESSION: Wall thickening in the sigmoid colon without adjacent inflammatory changes. This could be due to muscular hypertrophy or focal colitis. No evidence of small bowel obstruction or inflammation. Appendix is normal. Electronically Signed   By: Burman Nieves M.D.   On: 06/23/2018 03:20       Procedures Procedures (including critical care time)  CT ABD/PELVIS April 26, 2016 CLINICAL DATA:  Lower abdominal pain and swelling for 4 months. Nausea.  EXAM: CT ABDOMEN AND PELVIS WITH CONTRAST  TECHNIQUE: Multidetector CT imaging of the abdomen and pelvis was performed using the standard protocol following bolus administration of intravenous contrast.  CONTRAST:  75mL ISOVUE-300 IOPAMIDOL (ISOVUE-300) INJECTION 61%  COMPARISON:  None.  FINDINGS: Lower chest: Lung bases are clear. No effusions. Heart is normal size.  Hepatobiliary: Prior cholecystectomy.  No focal hepatic abnormality.  Pancreas: No focal abnormality or ductal dilatation.  Spleen: No focal abnormality.  Normal size.  Adrenals/Urinary Tract: No adrenal abnormality. No focal renal abnormality. No stones or hydronephrosis. Urinary bladder is unremarkable.  Stomach/Bowel: Appendix is normal. Stomach, large and small bowel grossly unremarkable.  Vascular/Lymphatic: No evidence of aneurysm or adenopathy.  Reproductive: 3.8 cm cyst in the left ovary. 2.4 cm cyst in the right ovary. Uterus is unremarkable.  Other: No free fluid or free air.  Musculoskeletal: No  acute bony abnormality or focal bone lesion.  IMPRESSION: Normal appendix.  Bilateral ovarian cysts, left larger than right.   Electronically Signed   By: Charlett Nose M.D.   On: 04/26/2016 10:53  Medications Ordered in ED Medications  sodium chloride 0.9 % bolus 1,000 mL (0 mLs Intravenous Stopped 06/23/18 0120)  sodium chloride 0.9 % bolus 500 mL (0 mLs Intravenous Stopped 06/23/18 0120)  metoCLOPramide (REGLAN) injection 5 mg (5 mg Intravenous Given 06/22/18 2354)  diphenhydrAMINE (BENADRYL) injection 12.5 mg (12.5 mg Intravenous Given 06/22/18 2354)  dicyclomine (BENTYL) injection 10 mg (10 mg Intramuscular Given 06/22/18 2354)  famotidine (PEPCID) IVPB 20 mg premix (0 mg Intravenous Stopped 06/23/18 0030)  iopamidol (ISOVUE-300) 61 % injection 50 mL (30 mLs Oral Contrast Given 06/23/18 0301)  iopamidol (ISOVUE-300) 61 % injection 100 mL (100 mLs Intravenous Contrast Given 06/23/18 0302)     Initial Impression / Assessment and Plan / ED Course  I have reviewed the triage vital signs and the nursing notes.  Pertinent labs & imaging results that were available  during my care of the patient were reviewed by me and considered in my medical decision making (see chart for details).    I looked up and patient had her gallbladder removed which was in 2015, she did have a abdominal CT scan done in September 2017 and April 2016 and there is no mention of a hernia being present.  She was given IV fluids, she was given nausea medication and Bentyl for her pain.  She also was given Pepcid IV since she does have some component of heartburn with this.  Recheck at 12:55 AM patient states her pain is improved.  We discussed her test results which is suggestive of acute pancreatitis.  She is agreeable to getting a CT scan.  Recheck at 3:30 AM we discussed her CT results.  Her pain is mainly in the epigastric area, and so I gave her instructions about GERD and she was placed on Prilosec.  I also let  her know that Prilosec is also over-the-counter.  But just in case she has a small early focal colitis starting she was also started on antibiotics for that.  She was advised to return to the emergency room if she gets worse.  Today she should drink plenty of fluids and this afternoon she can start things like [employer] chicken noodle soup, biscuits, or crackers.  She should advance her diet slowly.   Final Clinical Impressions(s) / ED Diagnoses   Final diagnoses:  Epigastric pain  Gastroesophageal reflux disease without esophagitis    ED Discharge Orders         Ordered    omeprazole (PRILOSEC) 20 MG capsule     06/23/18 0333    ondansetron (ZOFRAN) 4 MG tablet  Every 8 hours PRN     06/23/18 0333    ciprofloxacin (CIPRO) 500 MG tablet  2 times daily     06/23/18 0333    metroNIDAZOLE (FLAGYL) 500 MG tablet  3 times daily     06/23/18 1610          Plan discharge  Devoria Albe, MD, Concha Pyo, MD 06/23/18 [phone]

## 2018-06-23 ENCOUNTER — Emergency Department (HOSPITAL_COMMUNITY): Payer: Self-pay

## 2018-06-23 LAB — URINALYSIS, ROUTINE W REFLEX MICROSCOPIC
BILIRUBIN URINE: NEGATIVE
Glucose, UA: NEGATIVE mg/dL
KETONES UR: 5 mg/dL — AB
Leukocytes, UA: NEGATIVE
NITRITE: NEGATIVE
PROTEIN: NEGATIVE mg/dL
Specific Gravity, Urine: 1.019 (ref 1.005–1.030)
pH: 6 (ref 5.0–8.0)

## 2018-06-23 LAB — COMPREHENSIVE METABOLIC PANEL
ALK PHOS: 20 U/L — AB (ref 38–126)
ALT: 15 U/L (ref 0–44)
ANION GAP: 7 (ref 5–15)
AST: 17 U/L (ref 15–41)
Albumin: 4.1 g/dL (ref 3.5–5.0)
BUN: 16 mg/dL (ref 6–20)
CALCIUM: 8.7 mg/dL — AB (ref 8.9–10.3)
CHLORIDE: 107 mmol/L (ref 98–111)
CO2: 21 mmol/L — ABNORMAL LOW (ref 22–32)
Creatinine, Ser: 0.54 mg/dL (ref 0.44–1.00)
GFR calc Af Amer: >60 mL/min (ref >60)
GFR calc non Af Amer: >60 mL/min (ref >60)
GLUCOSE: 91 mg/dL (ref 70–99)
Potassium: 3.7 mmol/L (ref 3.5–5.1)
SODIUM: 135 mmol/L (ref 135–145)
Total Bilirubin: 0.8 mg/dL (ref 0.3–1.2)
Total Protein: 6.9 g/dL (ref 6.5–8.1)

## 2018-06-23 LAB — LIPASE, BLOOD: Lipase: 149 U/L — ABNORMAL HIGH (ref 11–51)

## 2018-06-23 MED ORDER — CIPROFLOXACIN HCL 500 MG PO TABS: 500.0000 mg | ORAL_TABLET | Freq: Two times a day (BID) | ORAL | 0 refills | Status: DC

## 2018-06-23 MED ORDER — IOPAMIDOL (ISOVUE-300) INJECTION 61%
100.0000 mL | Freq: Once | INTRAVENOUS | Status: AC | PRN
  Administered 2018-06-23: 100 mL via INTRAVENOUS

## 2018-06-23 MED ORDER — ONDANSETRON HCL 4 MG PO TABS: 4.0000 mg | ORAL_TABLET | Freq: Three times a day (TID) | ORAL | 0 refills | Status: DC | PRN

## 2018-06-23 MED ORDER — METRONIDAZOLE 500 MG PO TABS: 500.0000 mg | ORAL_TABLET | Freq: Three times a day (TID) | ORAL | 0 refills | Status: DC

## 2018-06-23 MED ORDER — IOPAMIDOL (ISOVUE-300) INJECTION 61%
50.0000 mL | Freq: Once | INTRAVENOUS | Status: AC | PRN
  Administered 2018-06-23: 30 mL via ORAL

## 2018-06-23 MED ORDER — OMEPRAZOLE 20 MG PO CPDR: DELAYED_RELEASE_CAPSULE | ORAL | 0 refills | Status: DC

## 2018-06-23 NOTE — Discharge Instructions (Addendum)
Drink plenty of fluids today and this afternoon you can start things like toast, crackers, [employer] chicken noodle soup. Take the antibiotics until gone. Take the prilosec/omeprazole for stomach acid, it is also OTC. Recheck if you get worse such as uncontrolled vomiting, worsening pain, fever.

## 2018-06-28 ENCOUNTER — Encounter (HOSPITAL_COMMUNITY): Payer: Self-pay | Admitting: Emergency Medicine

## 2018-06-28 ENCOUNTER — Emergency Department (HOSPITAL_COMMUNITY)
Admission: EM | Admit: 2018-06-28 | Discharge: 2018-06-29 | Disposition: A | Discharge status: Home / Self Care | Payer: Self-pay | Attending: Emergency Medicine | Admitting: Emergency Medicine

## 2018-06-28 ENCOUNTER — Other Ambulatory Visit: Payer: Self-pay

## 2018-06-28 DIAGNOSIS — R197 Diarrhea, unspecified: Secondary | ICD-10-CM | POA: Insufficient documentation

## 2018-06-28 DIAGNOSIS — Z79899 Other long term (current) drug therapy: Secondary | ICD-10-CM | POA: Insufficient documentation

## 2018-06-28 DIAGNOSIS — R112 Nausea with vomiting, unspecified: Secondary | ICD-10-CM | POA: Insufficient documentation

## 2018-06-28 DIAGNOSIS — R1013 Epigastric pain: Secondary | ICD-10-CM | POA: Insufficient documentation

## 2018-06-28 DIAGNOSIS — F17210 Nicotine dependence, cigarettes, uncomplicated: Secondary | ICD-10-CM | POA: Insufficient documentation

## 2018-06-28 LAB — CBC
HEMATOCRIT: 35.1 % — AB (ref 36.0–46.0)
HEMOGLOBIN: 11.4 g/dL — AB (ref 12.0–15.0)
MCH: 30 pg (ref 26.0–34.0)
MCHC: 32.5 g/dL (ref 30.0–36.0)
MCV: 92.4 fL (ref 80.0–100.0)
Platelets: 389 10*3/uL (ref 150–400)
RBC: 3.8 MIL/uL — AB (ref 3.87–5.11)
RDW: 17.2 % — ABNORMAL HIGH (ref 11.5–15.5)
WBC: 7.2 10*3/uL (ref 4.0–10.5)
nRBC: 0 % (ref 0.0–0.2)

## 2018-06-28 NOTE — ED Triage Notes (Addendum)
Pt C/O lower back pain that started around 3 hours ago. Pt also C/O N/V/D and cold sweats. Pt denies any urinary symptoms. Pt is taking Cipro and flagyl for colitis.

## 2018-06-29 ENCOUNTER — Emergency Department (HOSPITAL_COMMUNITY): Payer: Self-pay

## 2018-06-29 LAB — URINALYSIS, ROUTINE W REFLEX MICROSCOPIC
BILIRUBIN URINE: NEGATIVE
GLUCOSE, UA: NEGATIVE mg/dL
Ketones, ur: 20 mg/dL — AB
NITRITE: NEGATIVE
PROTEIN: 30 mg/dL — AB
Specific Gravity, Urine: 1.021 (ref 1.005–1.030)
pH: 5 (ref 5.0–8.0)

## 2018-06-29 LAB — COMPREHENSIVE METABOLIC PANEL
ALBUMIN: 4.2 g/dL (ref 3.5–5.0)
ALT: 19 U/L (ref 0–44)
AST: 20 U/L (ref 15–41)
Alkaline Phosphatase: 17 U/L — ABNORMAL LOW (ref 38–126)
Anion gap: 6 (ref 5–15)
BUN: 17 mg/dL (ref 6–20)
CHLORIDE: 107 mmol/L (ref 98–111)
CO2: 24 mmol/L (ref 22–32)
Calcium: 8.8 mg/dL — ABNORMAL LOW (ref 8.9–10.3)
Creatinine, Ser: 0.76 mg/dL (ref 0.44–1.00)
GFR calc Af Amer: >60 mL/min (ref >60)
GLUCOSE: 96 mg/dL (ref 70–99)
POTASSIUM: 3.7 mmol/L (ref 3.5–5.1)
Sodium: 137 mmol/L (ref 135–145)
Total Bilirubin: 0.9 mg/dL (ref 0.3–1.2)
Total Protein: 6.6 g/dL (ref 6.5–8.1)

## 2018-06-29 LAB — I-STAT BETA HCG BLOOD, ED (MC, WL, AP ONLY): I-stat hCG, quantitative: <5 m[IU]/mL (ref <5)

## 2018-06-29 LAB — LIPASE, BLOOD: LIPASE: 37 U/L (ref 11–51)

## 2018-06-29 MED ORDER — ONDANSETRON HCL 4 MG/2ML IJ SOLN
4.0000 mg | Freq: Once | INTRAMUSCULAR | Status: AC
  Administered 2018-06-29: 4 mg via INTRAVENOUS
  Filled 2018-06-29: qty 2

## 2018-06-29 MED ORDER — SODIUM CHLORIDE 0.9 % IV BOLUS
1000.0000 mL | Freq: Once | INTRAVENOUS | Status: AC
  Administered 2018-06-29: 1000 mL via INTRAVENOUS

## 2018-06-29 MED ORDER — DIPHENHYDRAMINE HCL 50 MG/ML IJ SOLN
12.5000 mg | Freq: Once | INTRAMUSCULAR | Status: AC
  Administered 2018-06-29: 12.5 mg via INTRAVENOUS
  Filled 2018-06-29: qty 1

## 2018-06-29 MED ORDER — METOCLOPRAMIDE HCL 5 MG/ML IJ SOLN
5.0000 mg | Freq: Once | INTRAMUSCULAR | Status: AC
  Administered 2018-06-29: 5 mg via INTRAVENOUS
  Filled 2018-06-29: qty 2

## 2018-06-29 MED ORDER — SODIUM CHLORIDE 0.9 % IV BOLUS
500.0000 mL | Freq: Once | INTRAVENOUS | Status: AC
  Administered 2018-06-29: 500 mL via INTRAVENOUS

## 2018-06-29 MED ORDER — FAMOTIDINE IN NACL 20-0.9 MG/50ML-% IV SOLN
20.0000 mg | Freq: Once | INTRAVENOUS | Status: AC
  Administered 2018-06-29: 20 mg via INTRAVENOUS
  Filled 2018-06-29: qty 50

## 2018-06-29 NOTE — Discharge Instructions (Addendum)
Drink plenty of fluids.  Finish your antibiotics.  You can take Robitussin-DM or Mucinex DM over-the-counter for your cough.  Take Imodium for your diarrhea.  Recheck as needed.

## 2018-06-29 NOTE — ED Provider Notes (Signed)
[facility] EMERGENCY DEPARTMENT Provider Note   CSN: [id number] Arrival date & time: 06/28/18  2145  Time seen 12:35 AM   History   Chief Complaint Chief Complaint  Patient presents with  . Back Pain    HPI Sophia Garcia is a 32 y.o. female.  HPI patient was seen by myself on November 5 with epigastric abdominal pain that is chronic.  Her CT scan showed a possible small focal area of colitis so she was treated with Flagyl and Cipro which she is taking.  She states she has had a cough that has some clear mucus production for the past 2 to 3 days and a stuffy nose.  She denies rhinorrhea but states she is sneezing.  She is unaware fever.  She states tonight at work around 7 PM she started getting nauseated and getting sweaty and had vomiting x3 and diarrhea x2 that she describes as loose and watery without blood.  She is unaware being around anybody else who is ill.  She is complaining of her usual chronic epigastric abdominal pain.  PCP Patient, No Pcp Per   Past Medical History:  Diagnosis Date  . BV (bacterial vaginosis)   . HPV (human papilloma virus) infection   . Normal pregnancy, incidental 12/23/2011  . Preterm labor   . Yeast infection     Patient Active Problem List   Diagnosis Date Noted  . Erroneous encounter - disregard 08/03/2012  . Anemia 06/14/2012  . Status post repeat low transverse cesarean section/BTL 06/13/2012  . Tobacco abuse 12/23/2011  . Substance abuse complicating pregnancy, antepartum 12/23/2011    Past Surgical History:  Procedure Laterality Date  . CESAREAN SECTION    . CESAREAN SECTION  06/12/2012   Procedure: CESAREAN SECTION;  Surgeon: Kirkland Hun, MD;  Location: [facility];  Service: Obstetrics;  Laterality: N/A;  Repeat  . CHOLECYSTECTOMY N/A 01/03/2014   Procedure: LAPAROSCOPIC CHOLECYSTECTOMY;  Surgeon: Dalia Heading, MD;  Location: [facility];  Service: General;  Laterality: N/A;  . FOOT SURGERY  2002   STEPPED ON SEWING NEEDLE  .  TOOTH EXTRACTION  2009   ONE TOOTH  . TUBAL LIGATION       OB History    Gravida  2   Para  2   Term  2   Preterm      AB      Living  2     SAB      TAB      Ectopic      Multiple      Live Births  2            Home Medications    Prior to Admission medications   Medication Sig Start Date End Date Taking? Authorizing Provider  ciprofloxacin (CIPRO) 500 MG tablet Take 1 tablet (500 mg total) by mouth 2 (two) times daily. 06/23/18   Devoria Albe, MD  metroNIDAZOLE (FLAGYL) 500 MG tablet Take 1 tablet (500 mg total) by mouth 3 (three) times daily. 06/23/18   Devoria Albe, MD  omeprazole (PRILOSEC) 20 MG capsule Take 1 po BID x 2 weeks then once a day 06/23/18   Devoria Albe, MD  ondansetron (ZOFRAN) 4 MG tablet Take 1 tablet (4 mg total) by mouth every 8 (eight) hours as needed. 06/23/18   Devoria Albe, MD    Family History Family History  Problem Relation Age of Onset  . Diabetes Father        ORAL MEDS  .  Cancer Paternal Aunt        BREAST;LIVER  . Diabetes Paternal Aunt   . Diabetes Paternal Uncle   . COPD Paternal Uncle   . Cancer Maternal Grandmother        BREAST  . COPD Mother        EMPHYSEMA  . Diabetes Paternal Grandfather   . Other Neg Hx     Social History Social History   Tobacco Use  . Smoking status: Current Every Day Smoker    Packs/day: 0.50    Years: 12.00    Pack years: 6.00    Types: Cigarettes  . Smokeless tobacco: Never Used  . Tobacco comment: 1 pack lasts 4 days  Substance Use Topics  . Alcohol use: Yes    Comment: occasionally  . Drug use: Yes    Types: Marijuana    Comment: occasionally  employed   Allergies   Latex; Baby oil; and Morphine and related   Review of Systems Review of Systems  All other systems reviewed and are negative.    Physical Exam Updated Vital Signs BP (!) 116/59 (BP Location: Right Arm)   Pulse 73   Temp 97.9 F (36.6 C) (Oral)   Resp 16   SpO2 100%   Physical Exam  Constitutional:  She is oriented to person, place, and time.  Non-toxic appearance. She does not appear ill. No distress.  Small thin female  HENT:  Head: Normocephalic and atraumatic.  Right Ear: External ear normal.  Left Ear: External ear normal.  Nose: Nose normal. No mucosal edema or rhinorrhea.  Mouth/Throat: Oropharynx is clear and moist and mucous membranes are normal. No dental abscesses or uvula swelling.  Eyes: Pupils are equal, round, and reactive to light. Conjunctivae and EOM are normal.  Neck: Normal range of motion and full passive range of motion without pain. Neck supple.  Cardiovascular: Normal rate, regular rhythm and normal heart sounds. Exam reveals no gallop and no friction rub.  No murmur heard. Pulmonary/Chest: Effort normal and breath sounds normal. No respiratory distress. She has no wheezes. She has no rhonchi. She has no rales. She exhibits no tenderness and no crepitus.  Abdominal: Soft. Normal appearance and bowel sounds are normal. She exhibits no distension. There is tenderness in the epigastric area. There is no rebound and no guarding.    Musculoskeletal: Normal range of motion. She exhibits no edema or tenderness.  Moves all extremities well.   Neurological: She is alert and oriented to person, place, and time. She has normal strength. No cranial nerve deficit.  Skin: Skin is warm, dry and intact. No rash noted. No erythema. No pallor.  Psychiatric: She has a normal mood and affect. Her speech is normal and behavior is normal. Her mood appears not anxious.  Nursing note and vitals reviewed.    ED Treatments / Results  Labs (all labs ordered are listed, but only abnormal results are displayed) Results for orders placed or performed during the hospital encounter of 06/28/18  Lipase, blood  Result Value Ref Range   Lipase 37 11 - 51 U/L  Comprehensive metabolic panel  Result Value Ref Range   Sodium 137 135 - 145 mmol/L   Potassium 3.7 3.5 - 5.1 mmol/L   Chloride  107 98 - 111 mmol/L   CO2 24 22 - 32 mmol/L   Glucose, Bld 96 70 - 99 mg/dL   BUN 17 6 - 20 mg/dL   Creatinine, Ser 8.11 0.44 - 1.00 mg/dL  Calcium 8.8 (L) 8.9 - 10.3 mg/dL   Total Protein 6.6 6.5 - 8.1 g/dL   Albumin 4.2 3.5 - 5.0 g/dL   AST 20 15 - 41 U/L   ALT 19 0 - 44 U/L   Alkaline Phosphatase 17 (L) 38 - 126 U/L   Total Bilirubin 0.9 0.3 - 1.2 mg/dL   GFR calc non Af Amer >60 >60 mL/min   GFR calc Af Amer >60 >60 mL/min   Anion gap 6 5 - 15  CBC  Result Value Ref Range   WBC 7.2 4.0 - 10.5 K/uL   RBC 3.80 (L) 3.87 - 5.11 MIL/uL   Hemoglobin 11.4 (L) 12.0 - 15.0 g/dL   HCT 16.1 (L) 09.6 - 04.5 %   MCV 92.4 80.0 - 100.0 fL   MCH 30.0 26.0 - 34.0 pg   MCHC 32.5 30.0 - 36.0 g/dL   RDW 40.9 (H) 81.1 - 91.4 %   Platelets 389 150 - 400 K/uL   nRBC 0.0 0.0 - 0.2 %  Urinalysis, Routine w reflex microscopic  Result Value Ref Range   Color, Urine AMBER (A) YELLOW   APPearance CLOUDY (A) CLEAR   Specific Gravity, Urine 1.021 1.005 - 1.030   pH 5.0 5.0 - 8.0   Glucose, UA NEGATIVE NEGATIVE mg/dL   Hgb urine dipstick MODERATE (A) NEGATIVE   Bilirubin Urine NEGATIVE NEGATIVE   Ketones, ur 20 (A) NEGATIVE mg/dL   Protein, ur 30 (A) NEGATIVE mg/dL   Nitrite NEGATIVE NEGATIVE   Leukocytes, UA MODERATE (A) NEGATIVE   RBC / HPF 21-50 0 - 5 RBC/hpf   WBC, UA >50 (H) 0 - 5 WBC/hpf   Bacteria, UA RARE (A) NONE SEEN   Squamous Epithelial / LPF 21-50 0 - 5   Mucus PRESENT   I-Stat beta hCG blood, ED  Result Value Ref Range   I-stat hCG, quantitative <5.0 <5 mIU/mL   Comment 3           Laboratory interpretation all normal except mild anemia, contaminated UA without +nitrite    EKG None  Radiology Dg Chest 2 View  Result Date: 06/29/2018 CLINICAL DATA:  Acute onset of lower back pain. Nausea, vomiting and diarrhea. Diaphoresis. EXAM: CHEST - 2 VIEW COMPARISON:  Chest radiograph performed 04/16/2017 FINDINGS: The lungs are well-aerated and clear. There is no evidence of  focal opacification, pleural effusion or pneumothorax. The heart is normal in size; the mediastinal contour is within normal limits. No acute osseous abnormalities are seen. IMPRESSION: No acute cardiopulmonary process seen. Electronically Signed   By: Roanna Raider M.D.   On: 06/29/2018 02:34    Ct Abdomen Pelvis W Contrast  Result Date: 06/23/2018 CLINICAL DATA:  Increasing discomfort with urination. Burning sensation. Pelvic discomfort. IMPRESSION: Wall thickening in the sigmoid colon without adjacent inflammatory changes. This could be due to muscular hypertrophy or focal colitis. No evidence of small bowel obstruction or inflammation. Appendix is normal. Electronically Signed   By: Burman Nieves M.D.   On: 06/23/2018 03:20    Procedures Procedures (including critical care time)  Medications Ordered in ED Medications  sodium chloride 0.9 % bolus 1,000 mL (0 mLs Intravenous Stopped 06/29/18 0342)  sodium chloride 0.9 % bolus 500 mL (0 mLs Intravenous Stopped 06/29/18 0342)  metoCLOPramide (REGLAN) injection 5 mg (5 mg Intravenous Given 06/29/18 0117)  diphenhydrAMINE (BENADRYL) injection 12.5 mg (12.5 mg Intravenous Given 06/29/18 0116)  famotidine (PEPCID) IVPB 20 mg premix (0 mg Intravenous Stopped 06/29/18 0341)  sodium chloride 0.9 % bolus 1,000 mL (0 mLs Intravenous Stopped 06/29/18 0519)  ondansetron (ZOFRAN) injection 4 mg (4 mg Intravenous Given 06/29/18 0519)     Initial Impression / Assessment and Plan / ED Course  I have reviewed the triage vital signs and the nursing notes.  Pertinent labs & imaging results that were available during my care of the patient were reviewed by me and considered in my medical decision making (see chart for details).   We discussed her test results which were normal.  Chest x-ray was ordered to look for underlying pneumonia.  She had an IV inserted and was given IV fluids, IV nausea medications and IV Pepcid for GERD.  Recheck at 2:10 AM  patient states she has only had a small amount of urinary output.  She has mild nausea but does not want anything else for nausea at this time.  She was just getting ready to go to her chest x-ray.  5:10 AM when I go in the room patient states she is nauseated again.  She was given Zofran IV.  6:40 AM patient feels better and wants to be charged.   Review of the [place] shows one prescription for #6 tramadol 50 mg tablets prescribed on May 16, 2017  Final Clinical Impressions(s) / ED Diagnoses   Final diagnoses:  Nausea vomiting and diarrhea    ED Discharge Orders    None      Plan discharge  Devoria Albe, MD, Concha Pyo, MD 06/29/18 [phone]

## 2018-08-27 ENCOUNTER — Other Ambulatory Visit: Payer: Self-pay

## 2018-08-27 ENCOUNTER — Encounter (HOSPITAL_COMMUNITY): Payer: Self-pay | Admitting: Emergency Medicine

## 2018-08-27 ENCOUNTER — Emergency Department (HOSPITAL_COMMUNITY)
Admission: EM | Admit: 2018-08-27 | Discharge: 2018-08-27 | Disposition: A | Discharge status: Home / Self Care | Payer: Self-pay | Attending: Emergency Medicine | Admitting: Emergency Medicine

## 2018-08-27 DIAGNOSIS — J111 Influenza due to unidentified influenza virus with other respiratory manifestations: Secondary | ICD-10-CM | POA: Insufficient documentation

## 2018-08-27 DIAGNOSIS — R69 Illness, unspecified: Secondary | ICD-10-CM

## 2018-08-27 DIAGNOSIS — F17210 Nicotine dependence, cigarettes, uncomplicated: Secondary | ICD-10-CM | POA: Insufficient documentation

## 2018-08-27 DIAGNOSIS — Z79899 Other long term (current) drug therapy: Secondary | ICD-10-CM | POA: Insufficient documentation

## 2018-08-27 MED ORDER — ACETAMINOPHEN 500 MG PO TABS
1000.0000 mg | ORAL_TABLET | Freq: Once | ORAL | Status: AC
  Administered 2018-08-27: 1000 mg via ORAL
  Filled 2018-08-27: qty 2

## 2018-08-27 MED ORDER — PSEUDOEPHEDRINE HCL 60 MG PO TABS
60.0000 mg | ORAL_TABLET | Freq: Once | ORAL | Status: AC
  Administered 2018-08-27: 60 mg via ORAL
  Filled 2018-08-27: qty 1

## 2018-08-27 MED ORDER — LORATADINE-PSEUDOEPHEDRINE ER 5-120 MG PO TB12: 1.0000 | ORAL_TABLET | Freq: Two times a day (BID) | ORAL | 0 refills | Status: DC

## 2018-08-27 MED ORDER — IBUPROFEN 400 MG PO TABS
400.0000 mg | ORAL_TABLET | Freq: Once | ORAL | Status: AC
  Administered 2018-08-27: 400 mg via ORAL
  Filled 2018-08-27: qty 1

## 2018-08-27 MED ORDER — PROMETHAZINE-DM 6.25-15 MG/5ML PO SYRP: 5.0000 mL | ORAL_SOLUTION | Freq: Four times a day (QID) | ORAL | 0 refills | Status: DC | PRN

## 2018-08-27 NOTE — ED Triage Notes (Signed)
Pt c/o of sore throat and cough x 3 days

## 2018-08-27 NOTE — ED Provider Notes (Signed)
[name] EMERGENCY DEPARTMENT Provider Note   CSN: [id number] Arrival date & time: 08/27/18  [id number]     History   Chief Complaint Chief Complaint  Patient presents with  . Sore Throat    HPI Sophia Garcia is a 33 y.o. female.  The history is provided by the patient.  Sore Throat  This is a new problem. The current episode started more than 2 days ago. The problem occurs constantly. The problem has not changed since onset.Associated symptoms include headaches. Pertinent negatives include no chest pain, no abdominal pain and no shortness of breath. Associated symptoms comments: cough. The symptoms are aggravated by swallowing. Nothing relieves the symptoms. She has tried acetaminophen for the symptoms. The treatment provided no relief.    Past Medical History:  Diagnosis Date  . BV (bacterial vaginosis)   . HPV (human papilloma virus) infection   . Normal pregnancy, incidental 12/23/2011  . Preterm labor   . Yeast infection     Patient Active Problem List   Diagnosis Date Noted  . Erroneous encounter - disregard 08/03/2012  . Anemia 06/14/2012  . Status post repeat low transverse cesarean section/BTL 06/13/2012  . Tobacco abuse 12/23/2011  . Substance abuse complicating pregnancy, antepartum 12/23/2011    Past Surgical History:  Procedure Laterality Date  . CESAREAN SECTION    . CESAREAN SECTION  06/12/2012   Procedure: CESAREAN SECTION;  Surgeon: Kirkland HunArthur Stringer, MD;  Location: [facility];  Service: Obstetrics;  Laterality: N/A;  Repeat  . CHOLECYSTECTOMY N/A 01/03/2014   Procedure: LAPAROSCOPIC CHOLECYSTECTOMY;  Surgeon: Dalia HeadingMark A Jenkins, MD;  Location: [facility];  Service: General;  Laterality: N/A;  . FOOT SURGERY  2002   STEPPED ON SEWING NEEDLE  . TOOTH EXTRACTION  2009   ONE TOOTH  . TUBAL LIGATION       OB History    Gravida  2   Para  2   Term  2   Preterm      AB      Living  2     SAB      TAB      Ectopic      Multiple      Live Births  2             Home Medications    Prior to Admission medications   Medication Sig Start Date End Date Taking? Authorizing Provider  ciprofloxacin (CIPRO) 500 MG tablet Take 1 tablet (500 mg total) by mouth 2 (two) times daily. 06/23/18   Devoria AlbeKnapp, Iva, MD  metroNIDAZOLE (FLAGYL) 500 MG tablet Take 1 tablet (500 mg total) by mouth 3 (three) times daily. 06/23/18   Devoria AlbeKnapp, Iva, MD  omeprazole (PRILOSEC) 20 MG capsule Take 1 po BID x 2 weeks then once a day 06/23/18   Devoria AlbeKnapp, Iva, MD  ondansetron (ZOFRAN) 4 MG tablet Take 1 tablet (4 mg total) by mouth every 8 (eight) hours as needed. 06/23/18   Devoria AlbeKnapp, Iva, MD    Family History Family History  Problem Relation Age of Onset  . Diabetes Father        ORAL MEDS  . Cancer Paternal Aunt        BREAST;LIVER  . Diabetes Paternal Aunt   . Diabetes Paternal Uncle   . COPD Paternal Uncle   . Cancer Maternal Grandmother        BREAST  . COPD Mother        EMPHYSEMA  . Diabetes Paternal Grandfather   .  Other Neg Hx     Social History Social History   Tobacco Use  . Smoking status: Current Every Day Smoker    Packs/day: 0.50    Years: 12.00    Pack years: 6.00    Types: Cigarettes  . Smokeless tobacco: Never Used  . Tobacco comment: 1 pack lasts 4 days  Substance Use Topics  . Alcohol use: Yes    Comment: occasionally  . Drug use: Yes    Types: Marijuana    Comment: occasionally     Allergies   Latex; Baby oil; and Morphine and related   Review of Systems Review of Systems  Constitutional: Negative for activity change.       All ROS Neg except as noted in HPI  HENT: Positive for sore throat. Negative for nosebleeds.   Eyes: Negative for photophobia and discharge.  Respiratory: Positive for cough. Negative for shortness of breath and wheezing.   Cardiovascular: Negative for chest pain and palpitations.  Gastrointestinal: Negative for abdominal pain and blood in stool.  Genitourinary: Negative for dysuria, frequency and  hematuria.  Musculoskeletal: Negative for arthralgias, back pain and neck pain.  Skin: Negative.   Neurological: Positive for headaches. Negative for dizziness, seizures and speech difficulty.  Psychiatric/Behavioral: Negative for confusion and hallucinations.     Physical Exam Updated Vital Signs BP 107/66 (BP Location: Right Arm)   Pulse 80   Temp 97.9 F (36.6 C) (Oral)   Resp 16   Ht 4\' 11"  (1.499 m)   Wt 45.4 kg   SpO2 100%   BMI 20.20 kg/m   Physical Exam Vitals signs and nursing note reviewed.  Constitutional:      Appearance: She is well-developed. She is not toxic-appearing.  HENT:     Head: Normocephalic.     Right Ear: Tympanic membrane and external ear normal.     Left Ear: Tympanic membrane and external ear normal.     Nose: Congestion present.     Mouth/Throat:     Pharynx: Uvula swelling present.     Comments: Mild erythema of the posterior pharynx. Eyes:     General: Lids are normal.     Pupils: Pupils are equal, round, and reactive to light.  Neck:     Musculoskeletal: Normal range of motion and neck supple.     Vascular: No carotid bruit.  Cardiovascular:     Rate and Rhythm: Normal rate and regular rhythm.     Pulses: Normal pulses.     Heart sounds: Normal heart sounds.  Pulmonary:     Effort: No respiratory distress.     Breath sounds: Normal breath sounds. No stridor.  Abdominal:     General: Bowel sounds are normal.     Palpations: Abdomen is soft.     Tenderness: There is no abdominal tenderness. There is no guarding.  Musculoskeletal: Normal range of motion.  Lymphadenopathy:     Head:     Right side of head: No submandibular adenopathy.     Left side of head: No submandibular adenopathy.     Cervical: No cervical adenopathy.  Skin:    General: Skin is warm and dry.  Neurological:     Mental Status: She is alert and oriented to person, place, and time.     Cranial Nerves: No cranial nerve deficit.     Sensory: No sensory deficit.    Psychiatric:        Speech: Speech normal.      ED Treatments /  Results  Labs (all labs ordered are listed, but only abnormal results are displayed) Labs Reviewed - No data to display  EKG None  Radiology No results found.  Procedures Procedures (including critical care time)  Medications Ordered in ED Medications - No data to display   Initial Impression / Assessment and Plan / ED Course  I have reviewed the triage vital signs and the nursing notes.  Pertinent labs & imaging results that were available during my care of the patient were reviewed by me and considered in my medical decision making (see chart for details).       Final Clinical Impressions(s) / ED Diagnoses MDM  Vital signs reviewed.  The examination favors a flulike illness.  I have asked the patient to get plenty of rest.  Increase fluids.  Wash hands frequently.  Prescription for Claritin-D and Promethazine DM for cough given to the patient.  The patient will use Tylenol every 4 hours or ibuprofen every 6 hours for fever and aching.  Patient is to follow-up with the primary physician, or return to the emergency department if any changes in condition, problems, or concerns.   Final diagnoses:  Influenza-like illness    ED Discharge Orders         Ordered    loratadine-pseudoephedrine (CLARITIN-D 12 HOUR) 5-120 MG tablet  2 times daily     08/27/18 0843    promethazine-dextromethorphan (PROMETHAZINE-DM) 6.25-15 MG/5ML syrup  4 times daily PRN     08/27/18 0843           Ivery QualeBryant, Kalandra Masters, PA-C 08/27/18 [id number]    Bethann BerkshireZammit, Joseph, MD 08/27/18 1549

## 2018-08-27 NOTE — Discharge Instructions (Addendum)
Your examination favors influenza-like illness.  Please wash hands frequently.  Please increase water, juices, Kool-Aid, Gatorade, etc.  Please use your mask until symptoms have resolved.  Use Tylenol extra strength every 4 hours, or 400 mg of ibuprofen every 6 hours for fever, aching, or discomfort.  Please use Claritin-D every 12 hours or 2 times daily.  Use Promethazine DM for cough.  This medication may cause drowsiness, please use it with caution.  Please rest as much as possible.

## 2018-08-27 NOTE — ED Notes (Signed)
Noted to have nasal congestion

## 2018-09-20 ENCOUNTER — Other Ambulatory Visit: Payer: Self-pay

## 2018-09-20 ENCOUNTER — Emergency Department (HOSPITAL_COMMUNITY)
Admission: EM | Admit: 2018-09-20 | Discharge: 2018-09-20 | Disposition: A | Discharge status: Home / Self Care | Payer: Self-pay | Attending: Emergency Medicine | Admitting: Emergency Medicine

## 2018-09-20 ENCOUNTER — Encounter (HOSPITAL_COMMUNITY): Payer: Self-pay

## 2018-09-20 DIAGNOSIS — J029 Acute pharyngitis, unspecified: Secondary | ICD-10-CM | POA: Insufficient documentation

## 2018-09-20 DIAGNOSIS — F17210 Nicotine dependence, cigarettes, uncomplicated: Secondary | ICD-10-CM | POA: Insufficient documentation

## 2018-09-20 DIAGNOSIS — Z91040 Latex allergy status: Secondary | ICD-10-CM | POA: Insufficient documentation

## 2018-09-20 DIAGNOSIS — Z79899 Other long term (current) drug therapy: Secondary | ICD-10-CM | POA: Insufficient documentation

## 2018-09-20 DIAGNOSIS — B9789 Other viral agents as the cause of diseases classified elsewhere: Secondary | ICD-10-CM | POA: Insufficient documentation

## 2018-09-20 LAB — GROUP A STREP BY PCR: Group A Strep by PCR: NOT DETECTED

## 2018-09-20 NOTE — Discharge Instructions (Addendum)
Return if any problems. Tylenol for discomfort  

## 2018-09-20 NOTE — ED Triage Notes (Signed)
Pt c/o sore throat for a couple weeks

## 2018-09-20 NOTE — ED Provider Notes (Addendum)
[facility] EMERGENCY DEPARTMENT Provider Note   CSN: [id number] Arrival date & time: 09/20/18  1041     History   Chief Complaint Chief Complaint  Patient presents with  . Sore Throat    HPI Sophia Garcia is a 33 y.o. female.  The history is provided by the patient. No language interpreter was used.  Sore Throat  This is a new problem. The current episode started 2 days ago. The problem occurs constantly. The problem has been gradually worsening. Nothing aggravates the symptoms. Nothing relieves the symptoms. She has tried nothing for the symptoms.  Pt request a test for strep.  Pt reports her tonsils are swollen.   Past Medical History:  Diagnosis Date  . BV (bacterial vaginosis)   . HPV (human papilloma virus) infection   . Normal pregnancy, incidental 12/23/2011  . Preterm labor   . Yeast infection     Patient Active Problem List   Diagnosis Date Noted  . Erroneous encounter - disregard 08/03/2012  . Anemia 06/14/2012  . Status post repeat low transverse cesarean section/BTL 06/13/2012  . Tobacco abuse 12/23/2011  . Substance abuse complicating pregnancy, antepartum 12/23/2011    Past Surgical History:  Procedure Laterality Date  . CESAREAN SECTION    . CESAREAN SECTION  06/12/2012   Procedure: CESAREAN SECTION;  Surgeon: Kirkland Hun, MD;  Location: [facility];  Service: Obstetrics;  Laterality: N/A;  Repeat  . CHOLECYSTECTOMY N/A 01/03/2014   Procedure: LAPAROSCOPIC CHOLECYSTECTOMY;  Surgeon: Dalia Heading, MD;  Location: [facility];  Service: General;  Laterality: N/A;  . FOOT SURGERY  2002   STEPPED ON SEWING NEEDLE  . TOOTH EXTRACTION  2009   ONE TOOTH  . TUBAL LIGATION       OB History    Gravida  2   Para  2   Term  2   Preterm      AB      Living  2     SAB      TAB      Ectopic      Multiple      Live Births  2            Home Medications    Prior to Admission medications   Medication Sig Start Date End Date Taking?  Authorizing Provider  ciprofloxacin (CIPRO) 500 MG tablet Take 1 tablet (500 mg total) by mouth 2 (two) times daily. 06/23/18   Devoria Albe, MD  loratadine-pseudoephedrine (CLARITIN-D 12 HOUR) 5-120 MG tablet Take 1 tablet by mouth 2 (two) times daily. For congestion and cough. 08/27/18   Ivery Quale, PA-C  metroNIDAZOLE (FLAGYL) 500 MG tablet Take 1 tablet (500 mg total) by mouth 3 (three) times daily. 06/23/18   Devoria Albe, MD  omeprazole (PRILOSEC) 20 MG capsule Take 1 po BID x 2 weeks then once a day 06/23/18   Devoria Albe, MD  ondansetron (ZOFRAN) 4 MG tablet Take 1 tablet (4 mg total) by mouth every 8 (eight) hours as needed. 06/23/18   Devoria Albe, MD  promethazine-dextromethorphan (PROMETHAZINE-DM) 6.25-15 MG/5ML syrup Take 5 mLs by mouth 4 (four) times daily as needed for cough. 08/27/18   Ivery Quale, PA-C    Family History Family History  Problem Relation Age of Onset  . Diabetes Father        ORAL MEDS  . Cancer Paternal Aunt        BREAST;LIVER  . Diabetes Paternal Aunt   . Diabetes Paternal Uncle   .  COPD Paternal Uncle   . Cancer Maternal Grandmother        BREAST  . COPD Mother        EMPHYSEMA  . Diabetes Paternal Grandfather   . Other Neg Hx     Social History Social History   Tobacco Use  . Smoking status: Current Every Day Smoker    Packs/day: 0.50    Years: 12.00    Pack years: 6.00    Types: Cigarettes  . Smokeless tobacco: Never Used  . Tobacco comment: 1 pack lasts 4 days  Substance Use Topics  . Alcohol use: Yes    Comment: occasionally  . Drug use: Yes    Types: Marijuana    Comment: occasionally     Allergies   Latex; Baby oil; and Morphine and related   Review of Systems Review of Systems  All other systems reviewed and are negative.    Physical Exam Updated Vital Signs BP 112/73 (BP Location: Right Arm)   Pulse 82   Temp 98.3 F (36.8 C) (Oral)   Resp 18   Ht 4\' 11"  (1.499 m)   Wt 45.4 kg   LMP 09/20/2018   SpO2 99%   BMI  20.20 kg/m   Physical Exam Vitals signs reviewed.  Constitutional:      Appearance: She is well-developed.  HENT:     Head: Normocephalic.     Nose: No congestion.     Mouth/Throat:     Mouth: Mucous membranes are moist.     Tonsils: Swelling: 1+ on the right. 1+ on the left.  Eyes:     Conjunctiva/sclera: Conjunctivae normal.  Cardiovascular:     Rate and Rhythm: Normal rate.  Skin:    General: Skin is warm.  Neurological:     General: No focal deficit present.     Mental Status: She is alert.  Psychiatric:        Mood and Affect: Mood normal.      ED Treatments / Results  Labs (all labs ordered are listed, but only abnormal results are displayed) Labs Reviewed  GROUP A STREP BY PCR    EKG None  Radiology No results found.  Procedures Procedures (including critical care time)  Medications Ordered in ED Medications - No data to display   Initial Impression / Assessment and Plan / ED Course  I have reviewed the triage vital signs and the nursing notes.  Pertinent labs & imaging results that were available during my care of the patient were reviewed by me and considered in my medical decision making (see chart for details).     MDM  Strep screen is negative,  I suspect viral etiology.  Pt counseled on symptomatic care  Final Clinical Impressions(s) / ED Diagnoses   Final diagnoses:  Viral pharyngitis    ED Discharge Orders    None    An After Visit Summary was printed and given to the patient.    Elson AreasSofia, Madhav Mohon K, PA-C 09/20/18 1245    [address]ofia, Andyn Sales K, PA-C 09/20/18 1411    Eber HongMiller, Brian, MD 09/22/18 1925

## 2019-02-03 ENCOUNTER — Encounter (HOSPITAL_COMMUNITY): Payer: Self-pay

## 2019-02-03 ENCOUNTER — Emergency Department (HOSPITAL_COMMUNITY)
Admission: EM | Admit: 2019-02-03 | Discharge: 2019-02-03 | Disposition: A | Discharge status: Home / Self Care | Payer: Self-pay | Attending: Emergency Medicine | Admitting: Emergency Medicine

## 2019-02-03 ENCOUNTER — Other Ambulatory Visit: Payer: Self-pay

## 2019-02-03 DIAGNOSIS — F17200 Nicotine dependence, unspecified, uncomplicated: Secondary | ICD-10-CM | POA: Insufficient documentation

## 2019-02-03 DIAGNOSIS — J029 Acute pharyngitis, unspecified: Secondary | ICD-10-CM | POA: Insufficient documentation

## 2019-02-03 DIAGNOSIS — Z91040 Latex allergy status: Secondary | ICD-10-CM | POA: Insufficient documentation

## 2019-02-03 MED ORDER — KETOROLAC TROMETHAMINE 60 MG/2ML IM SOLN
30.0000 mg | Freq: Once | INTRAMUSCULAR | Status: AC
  Administered 2019-02-03: 06:00:00 30 mg via INTRAMUSCULAR
  Filled 2019-02-03: qty 2

## 2019-02-03 MED ORDER — DEXAMETHASONE 4 MG PO TABS
10.0000 mg | ORAL_TABLET | Freq: Once | ORAL | Status: AC
  Administered 2019-02-03: 10 mg via ORAL
  Filled 2019-02-03: qty 3

## 2019-02-03 MED ORDER — CLARITIN-D 12 HOUR 5-120 MG PO TB12: 1.0000 | ORAL_TABLET | Freq: Two times a day (BID) | ORAL | 0 refills | Status: DC

## 2019-02-03 NOTE — ED Provider Notes (Signed)
[facility] EMERGENCY DEPARTMENT Provider Note   CSN: [id number] Arrival date & time: 02/03/19  0459    History   Chief Complaint Chief Complaint  Patient presents with  . Sore Throat    HPI Sophia Garcia is a 33 y.o. female.     The history is provided by the patient.  Sore Throat This is a recurrent problem. The current episode started 1 to 2 hours ago. The problem occurs constantly. The problem has not changed since onset.Pertinent negatives include no chest pain, no abdominal pain, no headaches and no shortness of breath. Nothing aggravates the symptoms. Nothing relieves the symptoms. She has tried nothing for the symptoms. The treatment provided no relief.    Past Medical History:  Diagnosis Date  . BV (bacterial vaginosis)   . HPV (human papilloma virus) infection   . Normal pregnancy, incidental 12/23/2011  . Preterm labor   . Yeast infection     Patient Active Problem List   Diagnosis Date Noted  . Erroneous encounter - disregard 08/03/2012  . Anemia 06/14/2012  . Status post repeat low transverse cesarean section/BTL 06/13/2012  . Tobacco abuse 12/23/2011  . Substance abuse complicating pregnancy, antepartum 12/23/2011    Past Surgical History:  Procedure Laterality Date  . CESAREAN SECTION    . CESAREAN SECTION  06/12/2012   Procedure: CESAREAN SECTION;  Surgeon: Ena Dawley, MD;  Location: [facility];  Service: Obstetrics;  Laterality: N/A;  Repeat  . CHOLECYSTECTOMY N/A 01/03/2014   Procedure: LAPAROSCOPIC CHOLECYSTECTOMY;  Surgeon: Jamesetta So, MD;  Location: [facility];  Service: General;  Laterality: N/A;  . FOOT SURGERY  2002   STEPPED ON SEWING NEEDLE  . TOOTH EXTRACTION  2009   ONE TOOTH  . TUBAL LIGATION       OB History    Gravida  2   Para  2   Term  2   Preterm      AB      Living  2     SAB      TAB      Ectopic      Multiple      Live Births  2            Home Medications    Prior to Admission medications    Medication Sig Start Date End Date Taking? Authorizing Provider  loratadine-pseudoephedrine (CLARITIN-D 12 HOUR) 5-120 MG tablet Take 1 tablet by mouth 2 (two) times daily. For congestion and cough. 02/03/19   Anzleigh Slaven, Corene Cornea, MD  omeprazole (PRILOSEC) 20 MG capsule Take 1 po BID x 2 weeks then once a day 06/23/18 02/03/19  Rolland Porter, MD    Family History Family History  Problem Relation Age of Onset  . Diabetes Father        ORAL MEDS  . Cancer Paternal Aunt        BREAST;LIVER  . Diabetes Paternal Aunt   . Diabetes Paternal Uncle   . COPD Paternal Uncle   . Cancer Maternal Grandmother        BREAST  . COPD Mother        EMPHYSEMA  . Diabetes Paternal Grandfather   . Other Neg Hx     Social History Social History   Tobacco Use  . Smoking status: Current Every Day Smoker    Packs/day: 0.50    Years: 12.00    Pack years: 6.00    Types: Cigarettes  . Smokeless tobacco: Never Used  . Tobacco  comment: 1 pack lasts 4 days  Substance Use Topics  . Alcohol use: Yes    Comment: occasionally  . Drug use: Yes    Types: Marijuana    Comment: occasionally     Allergies   Latex, Baby oil, and Morphine and related   Review of Systems Review of Systems  HENT: Positive for ear pain.   Respiratory: Negative for shortness of breath.   Cardiovascular: Negative for chest pain.  Gastrointestinal: Negative for abdominal pain.  Neurological: Negative for headaches.  All other systems reviewed and are negative.    Physical Exam Updated Vital Signs BP (!) 120/95   Pulse 86   Temp 98.6 F (37 C) (Oral)   Resp 18   Ht 4\' 11"  (1.499 m)   Wt 45.4 kg   SpO2 100%   BMI 20.20 kg/m   Physical Exam Vitals signs and nursing note reviewed.  Constitutional:      Appearance: She is well-developed.  HENT:     Head: Normocephalic and atraumatic.     Right Ear: Tympanic membrane normal.     Left Ear: Tympanic membrane normal.     Mouth/Throat:     Dentition: Abnormal dentition.  Dental caries present. No dental tenderness or gum lesions.     Tongue: No lesions.     Palate: No mass.     Pharynx: Oropharynx is clear. Uvula midline. No pharyngeal swelling, oropharyngeal exudate, posterior oropharyngeal erythema or uvula swelling.     Tonsils: No tonsillar exudate.   Neck:     Musculoskeletal: Normal range of motion.  Cardiovascular:     Rate and Rhythm: Normal rate and regular rhythm.  Pulmonary:     Effort: No respiratory distress.     Breath sounds: No stridor.  Abdominal:     General: There is no distension.  Lymphadenopathy:     Cervical: Cervical adenopathy present.     Right cervical: Superficial cervical adenopathy present.     Left cervical: No superficial cervical adenopathy.  Neurological:     Mental Status: She is alert.      ED Treatments / Results  Labs (all labs ordered are listed, but only abnormal results are displayed) Labs Reviewed - No data to display  EKG None  Radiology No results found.  Procedures Procedures (including critical care time)  Medications Ordered in ED Medications  dexamethasone (DECADRON) tablet 10 mg (has no administration in time range)  ketorolac (TORADOL) injection 30 mg (has no administration in time range)     Initial Impression / Assessment and Plan / ED Course  I have reviewed the triage vital signs and the nursing notes.  Pertinent labs & imaging results that were available during my care of the patient were reviewed by me and considered in my medical decision making (see chart for details).  [name] was evaluated in Emergency Department on 02/03/2019 for the symptoms described in the history of present illness. She was evaluated in the context of the global COVID-19 pandemic, which necessitated consideration that the patient might be at risk for infection with the SARS-CoV-2 virus that causes COVID-19. Institutional protocols and algorithms that pertain to the evaluation of patients at risk  for COVID-19 are in a state of rapid change based on information released by regulatory bodies including the CDC and federal and state organizations. These policies and algorithms were followed during the patient's care in the ED.   Centor criteria score of 0 low likelihood of strep pharyngitis no indication  for testing.  Likely viral pharyngitis.  No symptoms otherwise for coronavirus.  Will treat symptomatically outpatient follow-up.   Final Clinical Impressions(s) / ED Diagnoses   Final diagnoses:  Acute pharyngitis, unspecified etiology    ED Discharge Orders         Ordered    loratadine-pseudoephedrine (CLARITIN-D 12 HOUR) 5-120 MG tablet  2 times daily     02/03/19 0537           Shayra Anton, Barbara CowerJason, MD 02/03/19 [phone]

## 2019-02-03 NOTE — ED Triage Notes (Signed)
Pt reports sore throat, chills, cough that started yesterday. Pain got worse today with swallowing. No fever.

## 2019-02-25 ENCOUNTER — Encounter (HOSPITAL_COMMUNITY): Payer: Self-pay | Admitting: Emergency Medicine

## 2019-02-25 ENCOUNTER — Emergency Department (HOSPITAL_COMMUNITY)
Admission: EM | Admit: 2019-02-25 | Discharge: 2019-02-25 | Disposition: A | Discharge status: Home / Self Care | Payer: Self-pay | Attending: Emergency Medicine | Admitting: Emergency Medicine

## 2019-02-25 ENCOUNTER — Other Ambulatory Visit: Payer: Self-pay

## 2019-02-25 DIAGNOSIS — Z113 Encounter for screening for infections with a predominantly sexual mode of transmission: Secondary | ICD-10-CM | POA: Insufficient documentation

## 2019-02-25 DIAGNOSIS — F17210 Nicotine dependence, cigarettes, uncomplicated: Secondary | ICD-10-CM | POA: Insufficient documentation

## 2019-02-25 DIAGNOSIS — Z79899 Other long term (current) drug therapy: Secondary | ICD-10-CM | POA: Insufficient documentation

## 2019-02-25 LAB — URINALYSIS, ROUTINE W REFLEX MICROSCOPIC
Bilirubin Urine: NEGATIVE
Glucose, UA: NEGATIVE mg/dL
Hgb urine dipstick: NEGATIVE
Ketones, ur: NEGATIVE mg/dL
Leukocytes,Ua: NEGATIVE
Nitrite: NEGATIVE
Protein, ur: NEGATIVE mg/dL
Specific Gravity, Urine: 1.014 (ref 1.005–1.030)
pH: 7 (ref 5.0–8.0)

## 2019-02-25 LAB — WET PREP, GENITAL
Clue Cells Wet Prep HPF POC: NONE SEEN
Sperm: NONE SEEN
Trich, Wet Prep: NONE SEEN
Yeast Wet Prep HPF POC: NONE SEEN

## 2019-02-25 LAB — PREGNANCY, URINE: Preg Test, Ur: NEGATIVE

## 2019-02-25 MED ORDER — FLUCONAZOLE 150 MG PO TABS
150.0000 mg | ORAL_TABLET | Freq: Once | ORAL | Status: AC
  Administered 2019-02-25: 150 mg via ORAL
  Filled 2019-02-25: qty 1

## 2019-02-25 NOTE — ED Provider Notes (Signed)
[name] EMERGENCY DEPARTMENT Provider Note   CSN: [id number] Arrival date & time: 02/25/19  1219     History   Chief Complaint Chief Complaint  Patient presents with  . SEXUALLY TRANSMITTED DISEASE    HPI Deborah L Donnie Aholley is a 33 y.o. female who presents for STD screen. She states that she had intercourse with a new partner a month ago. They told her that they may or may not have an STD (they were exposed to herpes but never got tested). The patient reports she had an episode of dysuria last week and so isn't sure if she has a UTI or yeast infection. She denies fever, vomiting, abdominal/pelvis pain, vaginal soreness, itching, or discharge. No genital ulcers. The dysuria only happened once. No hematuria.    HPI  Past Medical History:  Diagnosis Date  . BV (bacterial vaginosis)   . HPV (human papilloma virus) infection   . Normal pregnancy, incidental 12/23/2011  . Preterm labor   . Yeast infection     Patient Active Problem List   Diagnosis Date Noted  . Erroneous encounter - disregard 08/03/2012  . Anemia 06/14/2012  . Status post repeat low transverse cesarean section/BTL 06/13/2012  . Tobacco abuse 12/23/2011  . Substance abuse complicating pregnancy, antepartum 12/23/2011    Past Surgical History:  Procedure Laterality Date  . CESAREAN SECTION    . CESAREAN SECTION  06/12/2012   Procedure: CESAREAN SECTION;  Surgeon: Kirkland HunArthur Stringer, MD;  Location: [facility];  Service: Obstetrics;  Laterality: N/A;  Repeat  . CHOLECYSTECTOMY N/A 01/03/2014   Procedure: LAPAROSCOPIC CHOLECYSTECTOMY;  Surgeon: Dalia HeadingMark A Jenkins, MD;  Location: [facility];  Service: General;  Laterality: N/A;  . FOOT SURGERY  2002   STEPPED ON SEWING NEEDLE  . TOOTH EXTRACTION  2009   ONE TOOTH  . TUBAL LIGATION       OB History    Gravida  2   Para  2   Term  2   Preterm      AB      Living  2     SAB      TAB      Ectopic      Multiple      Live Births  2            Home  Medications    Prior to Admission medications   Medication Sig Start Date End Date Taking? Authorizing Provider  loratadine-pseudoephedrine (CLARITIN-D 12 HOUR) 5-120 MG tablet Take 1 tablet by mouth 2 (two) times daily. For congestion and cough. 02/03/19   Mesner, Barbara CowerJason, MD  omeprazole (PRILOSEC) 20 MG capsule Take 1 po BID x 2 weeks then once a day 06/23/18 02/03/19  Devoria AlbeKnapp, Iva, MD    Family History Family History  Problem Relation Age of Onset  . Diabetes Father        ORAL MEDS  . Cancer Paternal Aunt        BREAST;LIVER  . Diabetes Paternal Aunt   . Diabetes Paternal Uncle   . COPD Paternal Uncle   . Cancer Maternal Grandmother        BREAST  . COPD Mother        EMPHYSEMA  . Diabetes Paternal Grandfather   . Other Neg Hx     Social History Social History   Tobacco Use  . Smoking status: Current Every Day Smoker    Packs/day: 0.50    Years: 12.00    Pack years: 6.00  Types: Cigarettes  . Smokeless tobacco: Never Used  . Tobacco comment: 1 pack lasts 4 days  Substance Use Topics  . Alcohol use: Yes    Comment: occasionally  . Drug use: Yes    Types: Marijuana    Comment: occasionally     Allergies   Latex, Baby oil, and Morphine and related   Review of Systems Review of Systems  Constitutional: Negative for fever.  Genitourinary: Positive for dysuria. Negative for flank pain, hematuria, pelvic pain, vaginal discharge and vaginal pain.     Physical Exam Updated Vital Signs BP 121/83 (BP Location: Right Arm)   Pulse (!) 109   Temp 98.3 F (36.8 C) (Oral)   Resp 16   LMP 01/26/2019   SpO2 100%   Physical Exam Vitals signs and nursing note reviewed.  Constitutional:      General: She is not in acute distress.    Appearance: Normal appearance. She is well-developed. She is not ill-appearing.     Comments: Calm, cooperative. Tearful  HENT:     Head: Normocephalic and atraumatic.  Eyes:     General: No scleral icterus.       Right eye: No  discharge.        Left eye: No discharge.     Conjunctiva/sclera: Conjunctivae normal.     Pupils: Pupils are equal, round, and reactive to light.  Neck:     Musculoskeletal: Normal range of motion.  Cardiovascular:     Rate and Rhythm: Normal rate.  Pulmonary:     Effort: Pulmonary effort is normal. No respiratory distress.  Abdominal:     General: There is no distension.  Genitourinary:    Comments: Pelvic: No inguinal lymphadenopathy or inguinal hernia noted. Normal external genitalia. No pain with speculum insertion. Closed cervical os with normal appearance - no rash or lesions. White, clumpy discharge in vaginal vault. On bimanual examination no adnexal tenderness or cervical motion tenderness. Chaperone present during exam.   Skin:    General: Skin is warm and dry.  Neurological:     Mental Status: She is alert and oriented to person, place, and time.  Psychiatric:        Behavior: Behavior normal.      ED Treatments / Results  Labs (all labs ordered are listed, but only abnormal results are displayed) Labs Reviewed  WET PREP, GENITAL - Abnormal; Notable for the following components:      Result Value   WBC, Wet Prep HPF POC RARE (*)    All other components within normal limits  URINALYSIS, ROUTINE W REFLEX MICROSCOPIC - Abnormal; Notable for the following components:   APPearance HAZY (*)    All other components within normal limits  PREGNANCY, URINE  RPR  HIV ANTIBODY (ROUTINE TESTING W REFLEX)  HSV 2 ANTIBODY, IGG  GC/CHLAMYDIA PROBE AMP ([place]) NOT AT [facility]    EKG None  Radiology No results found.  Procedures Procedures (including critical care time)  Medications Ordered in ED Medications  fluconazole (DIFLUCAN) tablet 150 mg (has no administration in time range)     Initial Impression / Assessment and Plan / ED Course  I have reviewed the triage vital signs and the nursing notes.  Pertinent labs & imaging results that were available during  my care of the patient were reviewed by me and considered in my medical decision making (see chart for details).  33 year old female presents with concern for STD after unprotected sex with a new partner.  Abdomen is soft, non-tender. Pelvic exam is remarkable for white clumpy discharge. Wet prep was normal. UA and preg test are normal. Will treat clinically for yeast. She was given a dose of Diflucan here. HIV, RPR, G&C and HSV test was sent off. She was advised that she would be called if anything was abnormal but also advised that she can check her results on MyChart  Final Clinical Impressions(s) / ED Diagnoses   Final diagnoses:  Screen for STD (sexually transmitted disease)    ED Discharge Orders    None       Bethel BornGekas, Kelly Marie, PA-C 02/25/19 1441    Samuel JesterMcManus, Kathleen, DO 02/26/19 [phone]

## 2019-02-25 NOTE — Clinical Social Work Note (Signed)
Met with patient to confirm that she has no PCP, no insurance.  Confirmed.  Pt open to referral to [facility].  Does not meet eligibility requirements based on spouse's income.  [name], where she can be seen without insurance on a sliding fee scale.  Pt appreciative of help.

## 2019-02-25 NOTE — ED Triage Notes (Signed)
Patient states she is having symptoms of UTI and yeast infection, also reports she was contacted about a month ago from a sexual partner that was unsure if they had a STI or not.

## 2019-02-26 LAB — HSV 2 ANTIBODY, IGG: HSV 2 Glycoprotein G Ab, IgG: 1.22 index — ABNORMAL HIGH (ref 0.00–0.90)

## 2019-02-26 LAB — GC/CHLAMYDIA PROBE AMP (~~LOC~~) NOT AT ARMC
Chlamydia: NEGATIVE
Neisseria Gonorrhea: NEGATIVE

## 2019-02-26 LAB — RPR: RPR Ser Ql: NONREACTIVE

## 2019-02-26 LAB — HIV ANTIBODY (ROUTINE TESTING W REFLEX): HIV Screen 4th Generation wRfx: NONREACTIVE

## 2019-02-26 LAB — HSV-2 IGG SUPPLEMENTAL TEST: HSV-2 IgG Supplemental Test: POSITIVE — AB

## 2019-02-28 ENCOUNTER — Other Ambulatory Visit: Payer: Self-pay

## 2019-02-28 ENCOUNTER — Emergency Department (HOSPITAL_COMMUNITY)
Admission: EM | Admit: 2019-02-28 | Discharge: 2019-03-01 | Disposition: A | Discharge status: Home / Self Care | Payer: Self-pay | Attending: Emergency Medicine | Admitting: Emergency Medicine

## 2019-02-28 DIAGNOSIS — F1320 Sedative, hypnotic or anxiolytic dependence, uncomplicated: Secondary | ICD-10-CM | POA: Insufficient documentation

## 2019-02-28 DIAGNOSIS — R45851 Suicidal ideations: Secondary | ICD-10-CM | POA: Insufficient documentation

## 2019-02-28 DIAGNOSIS — F1020 Alcohol dependence, uncomplicated: Secondary | ICD-10-CM | POA: Insufficient documentation

## 2019-02-28 DIAGNOSIS — Z03818 Encounter for observation for suspected exposure to other biological agents ruled out: Secondary | ICD-10-CM | POA: Insufficient documentation

## 2019-02-28 DIAGNOSIS — F17210 Nicotine dependence, cigarettes, uncomplicated: Secondary | ICD-10-CM | POA: Insufficient documentation

## 2019-02-28 DIAGNOSIS — Z91040 Latex allergy status: Secondary | ICD-10-CM | POA: Insufficient documentation

## 2019-02-28 DIAGNOSIS — F1520 Other stimulant dependence, uncomplicated: Secondary | ICD-10-CM | POA: Insufficient documentation

## 2019-02-28 DIAGNOSIS — F1221 Cannabis dependence, in remission: Secondary | ICD-10-CM | POA: Insufficient documentation

## 2019-02-28 DIAGNOSIS — F329 Major depressive disorder, single episode, unspecified: Secondary | ICD-10-CM | POA: Insufficient documentation

## 2019-03-01 ENCOUNTER — Inpatient Hospital Stay (HOSPITAL_COMMUNITY)
Admission: AD | Admit: 2019-03-01 | Discharge: 2019-03-04 | DRG: 885 | Disposition: A | Discharge status: Home / Self Care | Payer: Federal, State, Local not specified - Other | Source: Intra-hospital | Attending: Psychiatry | Admitting: Psychiatry

## 2019-03-01 ENCOUNTER — Encounter (HOSPITAL_COMMUNITY): Payer: Self-pay

## 2019-03-01 ENCOUNTER — Encounter (HOSPITAL_COMMUNITY): Payer: Self-pay | Admitting: *Deleted

## 2019-03-01 ENCOUNTER — Other Ambulatory Visit: Payer: Self-pay

## 2019-03-01 DIAGNOSIS — F1910 Other psychoactive substance abuse, uncomplicated: Secondary | ICD-10-CM | POA: Diagnosis present

## 2019-03-01 DIAGNOSIS — F1210 Cannabis abuse, uncomplicated: Secondary | ICD-10-CM | POA: Diagnosis present

## 2019-03-01 DIAGNOSIS — B009 Herpesviral infection, unspecified: Secondary | ICD-10-CM | POA: Diagnosis present

## 2019-03-01 DIAGNOSIS — Z833 Family history of diabetes mellitus: Secondary | ICD-10-CM

## 2019-03-01 DIAGNOSIS — F332 Major depressive disorder, recurrent severe without psychotic features: Principal | ICD-10-CM | POA: Diagnosis present

## 2019-03-01 DIAGNOSIS — R45851 Suicidal ideations: Secondary | ICD-10-CM | POA: Diagnosis present

## 2019-03-01 DIAGNOSIS — Z915 Personal history of self-harm: Secondary | ICD-10-CM

## 2019-03-01 DIAGNOSIS — F1020 Alcohol dependence, uncomplicated: Secondary | ICD-10-CM | POA: Diagnosis present

## 2019-03-01 DIAGNOSIS — F333 Major depressive disorder, recurrent, severe with psychotic symptoms: Secondary | ICD-10-CM | POA: Diagnosis present

## 2019-03-01 LAB — CBC
HCT: 39 % (ref 36.0–46.0)
Hemoglobin: 12.8 g/dL (ref 12.0–15.0)
MCH: 30.5 pg (ref 26.0–34.0)
MCHC: 32.8 g/dL (ref 30.0–36.0)
MCV: 93.1 fL (ref 80.0–100.0)
Platelets: 419 10*3/uL — ABNORMAL HIGH (ref 150–400)
RBC: 4.19 MIL/uL (ref 3.87–5.11)
RDW: 17.2 % — ABNORMAL HIGH (ref 11.5–15.5)
WBC: 8.8 10*3/uL (ref 4.0–10.5)
nRBC: 0 % (ref 0.0–0.2)

## 2019-03-01 LAB — COMPREHENSIVE METABOLIC PANEL
ALT: 12 U/L (ref 0–44)
AST: 15 U/L (ref 15–41)
Albumin: 2 g/dL — ABNORMAL LOW (ref 3.5–5.0)
Alkaline Phosphatase: 26 U/L — ABNORMAL LOW (ref 38–126)
Anion gap: 11 (ref 5–15)
BUN: 16 mg/dL (ref 6–20)
CO2: 21 mmol/L — ABNORMAL LOW (ref 22–32)
Calcium: 9.2 mg/dL (ref 8.9–10.3)
Chloride: 108 mmol/L (ref 98–111)
Creatinine, Ser: 0.68 mg/dL (ref 0.44–1.00)
GFR calc Af Amer: >60 mL/min (ref >60)
GFR calc non Af Amer: >60 mL/min (ref >60)
Glucose, Bld: 95 mg/dL (ref 70–99)
Potassium: 3 mmol/L — ABNORMAL LOW (ref 3.5–5.1)
Sodium: 140 mmol/L (ref 135–145)
Total Bilirubin: 0.9 mg/dL (ref 0.3–1.2)
Total Protein: 7.9 g/dL (ref 6.5–8.1)

## 2019-03-01 LAB — SALICYLATE LEVEL: Salicylate Lvl: <7 mg/dL (ref 2.8–30.0)

## 2019-03-01 LAB — ACETAMINOPHEN LEVEL: Acetaminophen (Tylenol), Serum: <10 ug/mL — ABNORMAL LOW (ref 10–30)

## 2019-03-01 LAB — MAGNESIUM: Magnesium: 2.2 mg/dL (ref 1.7–2.4)

## 2019-03-01 LAB — I-STAT BETA HCG BLOOD, ED (MC, WL, AP ONLY): I-stat hCG, quantitative: <5 m[IU]/mL (ref <5)

## 2019-03-01 LAB — RAPID URINE DRUG SCREEN, HOSP PERFORMED
Amphetamines: NOT DETECTED
Barbiturates: NOT DETECTED
Benzodiazepines: NOT DETECTED
Cocaine: NOT DETECTED
Opiates: NOT DETECTED
Tetrahydrocannabinol: POSITIVE — AB

## 2019-03-01 LAB — ETHANOL: Alcohol, Ethyl (B): <10 mg/dL (ref <10)

## 2019-03-01 LAB — SARS CORONAVIRUS 2 BY RT PCR (HOSPITAL ORDER, PERFORMED IN ~~LOC~~ HOSPITAL LAB): SARS Coronavirus 2: NEGATIVE

## 2019-03-01 MED ORDER — ALUM & MAG HYDROXIDE-SIMETH 200-200-20 MG/5ML PO SUSP: 30.0000 mL | ORAL | Status: DC | PRN

## 2019-03-01 MED ORDER — MAGNESIUM HYDROXIDE 400 MG/5ML PO SUSP: 30.0000 mL | Freq: Every day | ORAL | Status: DC | PRN

## 2019-03-01 MED ORDER — NICOTINE 21 MG/24HR TD PT24: 21.0000 mg | MEDICATED_PATCH | Freq: Every day | TRANSDERMAL | Status: DC

## 2019-03-01 MED ORDER — POTASSIUM CHLORIDE CRYS ER 20 MEQ PO TBCR
40.0000 meq | EXTENDED_RELEASE_TABLET | Freq: Once | ORAL | Status: AC
  Administered 2019-03-01: 40 meq via ORAL
  Filled 2019-03-01: qty 2

## 2019-03-01 MED ORDER — ENSURE ENLIVE PO LIQD
237.0000 mL | Freq: Two times a day (BID) | ORAL | Status: DC
  Administered 2019-03-01 – 2019-03-02 (×2): 237 mL via ORAL

## 2019-03-01 MED ORDER — VALACYCLOVIR HCL 500 MG PO TABS
1000.0000 mg | ORAL_TABLET | Freq: Every day | ORAL | Status: DC
  Administered 2019-03-01 – 2019-03-04 (×4): 1000 mg via ORAL
  Filled 2019-03-01: qty 28
  Filled 2019-03-01: qty 2
  Filled 2019-03-01: qty 28
  Filled 2019-03-01 (×4): qty 2

## 2019-03-01 MED ORDER — ACETAMINOPHEN 325 MG PO TABS
650.0000 mg | ORAL_TABLET | Freq: Four times a day (QID) | ORAL | Status: DC | PRN
  Administered 2019-03-02 – 2019-03-04 (×4): 650 mg via ORAL
  Filled 2019-03-01 (×3): qty 2

## 2019-03-01 MED ORDER — FLUOXETINE HCL 20 MG PO CAPS
20.0000 mg | ORAL_CAPSULE | Freq: Every day | ORAL | Status: DC
  Administered 2019-03-01 – 2019-03-04 (×4): 20 mg via ORAL
  Filled 2019-03-01: qty 1
  Filled 2019-03-01: qty 14
  Filled 2019-03-01 (×3): qty 1
  Filled 2019-03-01: qty 14
  Filled 2019-03-01 (×2): qty 1

## 2019-03-01 MED ORDER — ONDANSETRON HCL 4 MG PO TABS: 4.0000 mg | ORAL_TABLET | Freq: Three times a day (TID) | ORAL | Status: DC | PRN

## 2019-03-01 MED ORDER — ALUM & MAG HYDROXIDE-SIMETH 200-200-20 MG/5ML PO SUSP: 30.0000 mL | Freq: Four times a day (QID) | ORAL | Status: DC | PRN

## 2019-03-01 MED ORDER — ACETAMINOPHEN 325 MG PO TABS: 650.0000 mg | ORAL_TABLET | ORAL | Status: DC | PRN

## 2019-03-01 MED ORDER — BUSPIRONE HCL 15 MG PO TABS
15.0000 mg | ORAL_TABLET | Freq: Three times a day (TID) | ORAL | Status: DC
  Administered 2019-03-01 – 2019-03-04 (×8): 15 mg via ORAL
  Filled 2019-03-01 (×6): qty 1
  Filled 2019-03-01: qty 42
  Filled 2019-03-01: qty 1
  Filled 2019-03-01 (×3): qty 42
  Filled 2019-03-01 (×5): qty 1
  Filled 2019-03-01: qty 42
  Filled 2019-03-01: qty 1
  Filled 2019-03-01: qty 42

## 2019-03-01 MED ORDER — NICOTINE 21 MG/24HR TD PT24
21.0000 mg | MEDICATED_PATCH | Freq: Every day | TRANSDERMAL | Status: DC
  Administered 2019-03-01 – 2019-03-04 (×4): 21 mg via TRANSDERMAL
  Filled 2019-03-01 (×6): qty 1

## 2019-03-01 MED ORDER — TEMAZEPAM 15 MG PO CAPS
30.0000 mg | ORAL_CAPSULE | Freq: Every day | ORAL | Status: DC
  Administered 2019-03-01: 21:00:00 30 mg via ORAL
  Filled 2019-03-01: qty 2

## 2019-03-01 MED ORDER — MAGNESIUM OXIDE 400 (241.3 MG) MG PO TABS
400.0000 mg | ORAL_TABLET | Freq: Once | ORAL | Status: AC
  Administered 2019-03-01: 400 mg via ORAL
  Filled 2019-03-01: qty 1
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
## 2019-03-01 NOTE — Plan of Care (Signed)
  Problem: Education: Goal: Knowledge of [facility] Education information/materials will improve Outcome: Progressing Goal: Emotional status will improve Outcome: Progressing Goal: Mental status will improve Outcome: Progressing Goal: Verbalization of understanding the information provided will improve Outcome: Progressing   Problem: Activity: Goal: Interest or engagement in activities will improve Outcome: Progressing   
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
## 2019-03-01 NOTE — Progress Notes (Addendum)
Spiritual care group on grief and loss facilitated by chaplain Jerene Pitch  Group Goal:  Support / Education around grief and loss Members engage in facilitated group support and psycho-social education.  Group Description:  Following introductions and group rules, group members engaged in facilitated group dialog and support around topic of loss, with particular support around experiences of loss in their lives. Group Identified types of loss (relationships / self / things) and identified patterns, circumstances, and changes that precipitate losses. Reflected on thoughts / feelings around loss, normalized grief responses, and recognized variety in grief experience. Patient Progress:  Present throughout group. Oriented with mood appropriate to context.  Received support from other group members around hospitalization.  Spoke with group about history of substance use, loss of father to pancreatic cancer 2 years prior.  She reported that her spouse had requested separation around substance use.  She is concerned with losing contact with two children.  Recieved support around seeking help, journey of learning how to engage feelings without numbing through substance use.

## 2019-03-01 NOTE — H&P (Signed)
Psychiatric Admission Assessment Adult  Patient Identification: Sophia Garcia Delaguila MRN:  [id number] Date of Evaluation:  03/01/2019 Chief Complaint:  MDD single episode without psychotic features Alcohol use disorder severe Cannabis use disorder Principal Diagnosis: Major depression/chronic polysubstance abuse Diagnosis:  Active Problems:   MDD (major depressive disorder), recurrent, severe, with psychosis (HCC)  History of Present Illness:   Patient is [age] she reports no prior psychiatric treatment inpatient or outpatient however she has been abusing cannabis since age 33 and after December 2018 she states she went all out abusing multiple compounds methamphetamines, cocaine, heroin, various opiate pills, alcohol and continued cannabis usage all in response to losing her father.  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  She states she is never had withdrawal symptoms or seizures when coming off drugs. Assessment team notes: Sophia Garcia Cripps is an 33 y.o. female who presents to the ED voluntarily. Pt reports SI with a plan to shoot herself with her husbands gun. Pt reports she had impulses twice PTA to grab the gun and shoot herself, so she asked her mother to take the gun out of the house. Pt states for the past 3 days she has been feeling suicidal and she has been getting progressively worse. Pt identifies her stressors as her husband asking for a divorce after she cheated on him with his cousin and contracted an STD from his cousin, unemployment, and abusing drugs. Pt states she has been using meth for the past year and a half without her husband's knowledge. Pt states she has not seen  her children in 4 days because of the incident with her husband. Pt states she has been at her mothers home for the past several days. Pt states she feels tremendous loss and worthlessness. Pt endorses feelings of hopelessness, guilt, and shame. Pt states she has never felt this bad. Pt states she has not eaten or slept in several days. Pt reports 1 prior suicide attempt in which she slit her wrists. Pt states she did not seek OPT or inpt tx at that time. Pt endorses excessive drug use including meth, pain pills, cannabis, alcohol, and cocaine. Pt states she tried heroin once but did not prefer it. Pt continues to endorse SI and cries hysterically throughout the assessment. Pt is unable to contract for safety and agrees to VOL consent for treatment. Associated Signs/Symptoms: Depression Symptoms:  depressed mood, insomnia, psychomotor agitation, (Hypo) Manic Symptoms:  Flight of Ideas, Anxiety Symptoms:  Excessive Worry, Psychotic Symptoms:  n/a PTSD Symptoms: NA Total Time spent with patient: 45 minutes  Past Psychiatric History: no past admits/exstesiev chem dep issues   Is the patient at risk to self? Yes.    Has the patient been a risk to self in the past 6 months? No.  Has the patient been a risk to self within the distant past? No.  Is the patient a risk to others? No.  Has the patient been a risk to others in the past 6 months? No.  Has the patient been a risk to others within the distant past? No.   Alcohol Screening: 1. How often do you have a drink  containing alcohol?: 4 or more times a week 2. How many drinks containing alcohol do you have on a typical day when you are drinking?: 7, 8, or 9 3. How often do you have six or more drinks on one occasion?: Weekly AUDIT-C Score: 10 4. How often during the last year have you found that you were not able to stop drinking once you had started?: Never 5. How often during the last year have you failed to do what was normally expected from  you becasue of drinking?: Less than monthly 6. How often during the last year have you needed a first drink in the morning to get yourself going after a heavy drinking session?: Never 7. How often during the last year have you had a feeling of guilt of remorse after drinking?: Daily or almost daily 8. How often during the last year have you been unable to remember what happened the night before because you had been drinking?: Never 9. Have you or someone else been injured as a result of your drinking?: No 10. Has a relative or friend or a doctor or another health worker been concerned about your drinking or suggested you cut down?: Yes, during the last year Alcohol Use Disorder Identification Test Final Score (AUDIT): 19 Alcohol Brief Interventions/Follow-up: Alcohol Education, Brief Advice, Continued Monitoring(Pt reports she has not had a drink in a week) Substance Abuse History in the last 12 months:  Yes.   Consequences of Substance Abuse: NA Previous Psychotropic Medications: No  Psychological Evaluations: No  Past Medical History:  Past Medical History:  Diagnosis Date  . BV (bacterial vaginosis)   . HPV (human papilloma virus) infection   . Normal pregnancy, incidental 12/23/2011  . Preterm labor   . Yeast infection     Past Surgical History:  Procedure Laterality Date  . CESAREAN SECTION    . CESAREAN SECTION  06/12/2012   Procedure: CESAREAN SECTION;  Surgeon: Kirkland HunArthur Stringer, MD;  Location: [facility];  Service: Obstetrics;  Laterality: N/A;  Repeat  . CHOLECYSTECTOMY N/A 01/03/2014   Procedure: LAPAROSCOPIC CHOLECYSTECTOMY;  Surgeon: Dalia HeadingMark A Jenkins, MD;  Location: [facility];  Service: General;  Laterality: N/A;  . FOOT SURGERY  2002   STEPPED ON SEWING NEEDLE  . TOOTH EXTRACTION  2009   ONE TOOTH  . TUBAL LIGATION     Family History:  Family History  Problem Relation Age of Onset  . Diabetes Father        ORAL MEDS  . Cancer Paternal Aunt        BREAST;LIVER  . Diabetes  Paternal Aunt   . Diabetes Paternal Uncle   . COPD Paternal Uncle   . Cancer Maternal Grandmother        BREAST  . COPD Mother        EMPHYSEMA  . Diabetes Paternal Grandfather   . Other Neg Hx    Family Psychiatric  History: ukn Tobacco Screening: Have you used any form of tobacco in the last 30 days? (Cigarettes, Smokeless Tobacco, Cigars, and/or Pipes): Yes Tobacco use, Select all that apply: 5 or more cigarettes per day Are you interested in Tobacco Cessation Medications?: Yes, will notify MD for an order Counseled patient on smoking cessation including recognizing danger situations, developing coping skills and basic information about quitting provided: Refused/Declined practical counseling Social History:  Social History   Substance and Sexual Activity  Alcohol Use Yes   Comment: occasionally     Social History   Substance and  Sexual Activity  Drug Use Yes  . Types: Marijuana   Comment: occasionally    Additional Social History:                           Allergies:   Allergies  Allergen Reactions  . Latex Other (See Comments)    Yeast infection  . Baby Oil Rash  . Morphine And Related Rash   Lab Results:  Results for orders placed or performed during the hospital encounter of 02/28/19 (from the past 48 hour(s))  Comprehensive metabolic panel     Status: Abnormal   Collection Time: 03/01/19 12:12 AM  Result Value Ref Range   Sodium 140 135 - 145 mmol/Garcia   Potassium 3.0 (Garcia) 3.5 - 5.1 mmol/Garcia   Chloride 108 98 - 111 mmol/Garcia   CO2 21 (Garcia) 22 - 32 mmol/Garcia   Glucose, Bld 95 70 - 99 mg/dL   BUN 16 6 - 20 mg/dL   Creatinine, Ser 0.68 0.44 - 1.00 mg/dL   Calcium 9.2 8.9 - 10.3 mg/dL   Total Protein 7.9 6.5 - 8.1 g/dL   Albumin 2.0 (Garcia) 3.5 - 5.0 g/dL   AST 15 15 - 41 U/Garcia   ALT 12 0 - 44 U/Garcia   Alkaline Phosphatase 26 (Garcia) 38 - 126 U/Garcia   Total Bilirubin 0.9 0.3 - 1.2 mg/dL   GFR calc non Af Amer >60 >60 mL/min   GFR calc Af Amer >60 >60 mL/min   Anion gap  11 5 - 15    Comment: Performed at [facility], [place] [address] [place], [place] [address]  Ethanol     Status: None   Collection Time: 03/01/19 12:12 AM  Result Value Ref Range   Alcohol, Ethyl (B) <10 <10 mg/dL    Comment: (NOTE) Lowest detectable limit for serum alcohol is 10 mg/dL. For medical purposes only. Performed at [facility], [place] [address], [place], [place] [address]   Salicylate level     Status: None   Collection Time: 03/01/19 12:12 AM  Result Value Ref Range   Salicylate Lvl <9.6 2.8 - 30.0 mg/dL    Comment: Performed at [facility], [place] [address] [place], [place] [address]  Acetaminophen level     Status: Abnormal   Collection Time: 03/01/19 12:12 AM  Result Value Ref Range   Acetaminophen (Tylenol), Serum <10 (Garcia) 10 - 30 ug/mL    Comment: (NOTE) Therapeutic concentrations vary significantly. A range of 10-30 ug/mL  may be an effective concentration for many patients. However, some  are best treated at concentrations outside of this range. Acetaminophen concentrations >150 ug/mL at 4 hours after ingestion  and >50 ug/mL at 12 hours after ingestion are often associated with  toxic reactions. Performed at [facility], [place] [address], [place], [place] [address]   cbc     Status: Abnormal   Collection Time: 03/01/19 12:12 AM  Result Value Ref Range   WBC 8.8 4.0 - 10.5 K/uL   RBC 4.19 3.87 - 5.11 MIL/uL   Hemoglobin 12.8 12.0 - 15.0 g/dL   HCT 39.0 36.0 - 46.0 %   MCV 93.1 80.0 - 100.0 fL   MCH 30.5 26.0 - 34.0 pg   MCHC 32.8 30.0 - 36.0 g/dL   RDW 17.2 (H) 11.5 - 15.5 %   Platelets 419 (H) 150 - 400 K/uL   nRBC 0.0 0.0 - 0.2 %  Comment: Performed at [facility], [address], [place], [place] [address]  Rapid urine drug screen (hospital performed)     Status: Abnormal   Collection Time: 03/01/19 12:12 AM  Result Value Ref Range   Opiates NONE  DETECTED NONE DETECTED   Cocaine NONE DETECTED NONE DETECTED   Benzodiazepines NONE DETECTED NONE DETECTED   Amphetamines NONE DETECTED NONE DETECTED   Tetrahydrocannabinol POSITIVE (A) NONE DETECTED   Barbiturates NONE DETECTED NONE DETECTED    Comment: (NOTE) DRUG SCREEN FOR MEDICAL PURPOSES ONLY.  IF CONFIRMATION IS NEEDED FOR ANY PURPOSE, NOTIFY LAB WITHIN 5 DAYS. LOWEST DETECTABLE LIMITS FOR URINE DRUG SCREEN Drug Class                     Cutoff (ng/mL) Amphetamine and metabolites    1000 Barbiturate and metabolites    200 Benzodiazepine                 200 Tricyclics and metabolites     300 Opiates and metabolites        300 Cocaine and metabolites        300 THC                            50 Performed at [facility], [address] [place], [place] [address]   I-Stat beta hCG blood, ED     Status: None   Collection Time: 03/01/19 12:36 AM  Result Value Ref Range   I-stat hCG, quantitative <5.0 <5 mIU/mL   Comment 3            Comment:   GEST. AGE      CONC.  (mIU/mL)   <=1 WEEK        5 - 50     2 WEEKS       50 - 500     3 WEEKS       100 - 10,000     4 WEEKS     1,000 - 30,000        FEMALE AND NON-PREGNANT FEMALE:     LESS THAN 5 mIU/mL   Magnesium     Status: None   Collection Time: 03/01/19  2:14 AM  Result Value Ref Range   Magnesium 2.2 1.7 - 2.4 mg/dL    Comment: Performed at [facility], [address] [place], [place] [address]  SARS Coronavirus 2 (CEPHEID - Performed in [facility] hospital lab), Hosp Order     Status: None   Collection Time: 03/01/19  2:54 AM   Specimen: Nasopharyngeal Swab  Result Value Ref Range   SARS Coronavirus 2 NEGATIVE NEGATIVE    Comment: (NOTE) If result is NEGATIVE SARS-CoV-2 target nucleic acids are NOT DETECTED. The SARS-CoV-2 RNA is generally detectable in upper and lower  respiratory specimens during the acute phase of infection. The lowest  concentration of SARS-CoV-2  viral copies this assay can detect is 250  copies / mL. A negative result does not preclude SARS-CoV-2 infection  and should not be used as the sole basis for treatment or other  patient management decisions.  A negative result may occur with  improper specimen collection / handling, submission of specimen other  than nasopharyngeal swab, presence of viral mutation(s) within the  areas targeted by this assay, and inadequate number of viral copies  (<250 copies / mL). A negative result must be combined  with clinical  observations, patient history, and epidemiological information. If result is POSITIVE SARS-CoV-2 target nucleic acids are DETECTED. The SARS-CoV-2 RNA is generally detectable in upper and lower  respiratory specimens dur ing the acute phase of infection.  Positive  results are indicative of active infection with SARS-CoV-2.  Clinical  correlation with patient history and other diagnostic information is  necessary to determine patient infection status.  Positive results do  not rule out bacterial infection or co-infection with other viruses. If result is PRESUMPTIVE POSTIVE SARS-CoV-2 nucleic acids MAY BE PRESENT.   A presumptive positive result was obtained on the submitted specimen  and confirmed on repeat testing.  While 2019 novel coronavirus  (SARS-CoV-2) nucleic acids may be present in the submitted sample  additional confirmatory testing may be necessary for epidemiological  and / or clinical management purposes  to differentiate between  SARS-CoV-2 and other Sarbecovirus currently known to infect humans.  If clinically indicated additional testing with an alternate test  methodology [id number]) is advised. The SARS-CoV-2 RNA is generally  detectable in upper and lower respiratory sp ecimens during the acute  phase of infection. The expected result is Negative. Fact Sheet for Patients:  BoilerBrush.com.cyhttps://www.fda.gov/media/136312/download Fact Sheet for Healthcare  Providers: https://pope.com/https://www.fda.gov/media/136313/download This test is not yet approved or cleared by the [place] FDA and has been authorized for detection and/or diagnosis of SARS-CoV-2 by FDA under an Emergency Use Authorization (EUA).  This EUA will remain in effect (meaning this test can be used) for the duration of the COVID-19 declaration under Section 564(b)(1) of the Act, 21 U.S.C. section 360bbb-3(b)(1), unless the authorization is terminated or revoked sooner. Performed at [facility], [address] [place], [place] [address]     Blood Alcohol level:  Lab Results  Component Value Date   ETH <10 03/01/2019    Metabolic Disorder Labs:  No results found for: HGBA1C, MPG No results found for: PROLACTIN No results found for: CHOL, TRIG, HDL, CHOLHDL, VLDL, LDLCALC  Current Medications: Current Facility-Administered Medications  Medication Dose Route Frequency Provider Last Rate Last Dose  . acetaminophen (TYLENOL) tablet 650 mg  650 mg Oral Q6H PRN Cobos, Rockey SituFernando A, MD      . alum & mag hydroxide-simeth (MAALOX/MYLANTA) 200-200-20 MG/5ML suspension 30 mL  30 mL Oral Q4H PRN Cobos, Rockey SituFernando A, MD      . busPIRone (BUSPAR) tablet 15 mg  15 mg Oral TID Malvin JohnsFarah, Javanna Patin, MD      . feeding supplement (ENSURE ENLIVE) (ENSURE ENLIVE) liquid 237 mL  237 mL Oral BID BM Cobos, Rockey SituFernando A, MD      . FLUoxetine (PROZAC) capsule 20 mg  20 mg Oral Daily Malvin JohnsFarah, Sharnita Bogucki, MD      . magnesium hydroxide (MILK OF MAGNESIA) suspension 30 mL  30 mL Oral Daily PRN Cobos, Rockey SituFernando A, MD      . nicotine (NICODERM CQ - dosed in mg/24 hours) patch 21 mg  21 mg Transdermal Daily Cobos, Rockey SituFernando A, MD   21 mg at 03/01/19 0807  . temazepam (RESTORIL) capsule 30 mg  30 mg Oral QHS Malvin JohnsFarah, Shandelle Borrelli, MD      . valACYclovir Ralph Dowdy(VALTREX) tablet 1,000 mg  1,000 mg Oral Daily Malvin JohnsFarah, Arianne Klinge, MD       PTA Medications: Medications Prior to Admission  Medication Sig Dispense Refill Last Dose  .  ibuprofen (ADVIL) 200 MG tablet Take 800 mg by mouth every 6 (six) hours as needed for headache, mild pain or moderate pain.     .Marland Kitchen  loratadine-pseudoephedrine (CLARITIN-D 12 HOUR) 5-120 MG tablet Take 1 tablet by mouth 2 (two) times daily. For congestion and cough. (Patient not taking: Reported on 03/01/2019) 20 tablet 0    Musculoskeletal: Strength & Muscle Tone: within normal limits Gait & Station: normal Patient leans: N/A  Psychiatric Specialty Exam: Physical Exam cranial nerves intact vital signs stable  ROS positive for recent diagnosis of genital herpes, cardiovascular negative neurological negative musculoskeletal negative endocrine negative  Blood pressure 124/74, pulse 82, temperature 98.6 F (37 C), temperature source Oral, resp. rate 16, height  (1.499 m), weight 44.5 kg, last menstrual period 01/30/2019.Body mass index is 19.79 kg/m.  General Appearance: Casual and Disheveled  Eye Contact:  Good  Speech:  Clear and Coherent  Volume:  Increased  Mood:  Anxious and Depressed  Affect:  Congruent  Thought Process:  Linear and Descriptions of Associations: Circumstantial  Orientation:  Full (Time, Place, and Person)  Thought Content:  Logical  Suicidal Thoughts:  Yes.  without intent/plan  Homicidal Thoughts:  No  Memory:  Immediate;   Poor  Judgement:  Good  Insight:  Fair  Psychomotor Activity:  Normal  Concentration:  Concentration: Good and Attention Span: Good  Recall:  Good  Fund of Knowledge:  Good  Language:  Negative  Akathisia:  Negative  Handed:  Right  AIMS (if indicated):     Assets:  Physical Health Resilience Social Support  ADL's:  Intact  Cognition:  WNL  Sleep:         Treatment Plan Summary: Daily contact with patient to assess and evaluate symptoms and progress in treatment and Medication management  Observation Level/Precautions:  15 minute checks  Laboratory:  UDS  Psychotherapy: Cognitive and rehab based  Medications: Requesting  Valtrex  Consultations: None necessary  Discharge Concerns: Long-term sobriety marital stability  Estimated LOS: 3-5  Other: Axis I depression severe without psychosis but with cannabis dependency and chronic polysubstance abuse   Physician Treatment Plan for Primary Diagnosis: <principal problem not specified> Long Term Goal(s): Improvement in symptoms so as ready for discharge  Short Term Goals: Ability to disclose and discuss suicidal ideas, Ability to demonstrate self-control will improve and Ability to identify and develop effective coping behaviors will improve  Physician Treatment Plan for Secondary Diagnosis: Active Problems:   MDD (major depressive disorder), recurrent, severe, with psychosis (HCC)  Long Term Goal(s): Improvement in symptoms so as ready for discharge  Short Term Goals: Ability to demonstrate self-control will improve, Ability to identify and develop effective coping behaviors will improve and Ability to maintain clinical measurements within normal limits will improve  I certify that inpatient services furnished can reasonably be expected to improve the patient's condition.    Malvin Johns, MD 7/13/20201:05 PM

## 2019-03-01 NOTE — ED Provider Notes (Signed)
Emergency Department Provider Note   I have reviewed the triage vital signs and the nursing notes.   HISTORY  Chief Complaint Suicidal   HPI Sophia Garcia is a 33 y.o. female with medical problems documented below who presents the emergency department today with severe depression, worsening suicidal ideation with thoughts of blowing her head off with her boyfriend's gun.  Patient states that she does many different drugs and drinks alcohol as well.  She recently cheated on her husband and thus he is going to get a divorce.  This with the drugs and alcohol is made her more more depressed that is untreated.  She says she never really thought about suicide in the past but she is thought of multiple different ways that she could do it and has the ability to do all of those.  She really wants help.  Very tearful during interview.   No other associated or modifying symptoms.    Past Medical History:  Diagnosis Date  . BV (bacterial vaginosis)   . HPV (human papilloma virus) infection   . Normal pregnancy, incidental 12/23/2011  . Preterm labor   . Yeast infection     Patient Active Problem List   Diagnosis Date Noted  . Erroneous encounter - disregard 08/03/2012  . Anemia 06/14/2012  . Status post repeat low transverse cesarean section/BTL 06/13/2012  . Tobacco abuse 12/23/2011  . Substance abuse complicating pregnancy, antepartum 12/23/2011    Past Surgical History:  Procedure Laterality Date  . CESAREAN SECTION    . CESAREAN SECTION  06/12/2012   Procedure: CESAREAN SECTION;  Surgeon: Kirkland HunArthur Stringer, MD;  Location: [facility];  Service: Obstetrics;  Laterality: N/A;  Repeat  . CHOLECYSTECTOMY N/A 01/03/2014   Procedure: LAPAROSCOPIC CHOLECYSTECTOMY;  Surgeon: Dalia HeadingMark A Jenkins, MD;  Location: [facility];  Service: General;  Laterality: N/A;  . FOOT SURGERY  2002   STEPPED ON SEWING NEEDLE  . TOOTH EXTRACTION  2009   ONE TOOTH  . TUBAL LIGATION      Current Outpatient Rx  .  Order #: [id number] Class: Historical Med  . Order #: [id number] Class: Normal    Allergies Latex, Baby oil, and Morphine and related  Family History  Problem Relation Age of Onset  . Diabetes Father        ORAL MEDS  . Cancer Paternal Aunt        BREAST;LIVER  . Diabetes Paternal Aunt   . Diabetes Paternal Uncle   . COPD Paternal Uncle   . Cancer Maternal Grandmother        BREAST  . COPD Mother        EMPHYSEMA  . Diabetes Paternal Grandfather   . Other Neg Hx     Social History Social History   Tobacco Use  . Smoking status: Current Every Day Smoker    Packs/day: 0.50    Years: 12.00    Pack years: 6.00    Types: Cigarettes  . Smokeless tobacco: Never Used  . Tobacco comment: 1 pack lasts 4 days  Substance Use Topics  . Alcohol use: Yes    Comment: occasionally  . Drug use: Yes    Types: Marijuana    Comment: occasionally    Review of Systems  All other systems negative except as documented in the HPI. All pertinent positives and negatives as reviewed in the HPI. ____________________________________________   PHYSICAL EXAM:  VITAL SIGNS: ED Triage Vitals  Enc Vitals Group     BP 02/28/19 2348 122/77  Pulse Rate 02/28/19 2348 84     Resp 02/28/19 2348 16     Temp 02/28/19 2348 98.6 F (37 C)     Temp Source 02/28/19 2348 Oral     SpO2 02/28/19 2348 99 %     Weight 02/28/19 2348 100 lb (45.4 kg)     Height 02/28/19 2348 4\' 11"  (1.499 m)     Head Circumference --      Peak Flow --      Pain Score 03/01/19 0009 0     Pain Loc --      Pain Edu? --      Excl. in [place]? --     Constitutional: Alert and oriented. Well appearing and in no acute distress. Eyes: Conjunctivae are normal. PERRL. EOMI. Head: Atraumatic. Nose: No congestion/rhinnorhea. Mouth/Throat: Mucous membranes are moist.  Oropharynx non-erythematous. Neck: No stridor.  No meningeal signs.   Cardiovascular: Normal rate, regular rhythm. Good peripheral circulation. Grossly normal heart  sounds.   Respiratory: Normal respiratory effort.  No retractions. Lungs CTAB. Gastrointestinal: Soft and nontender. No distention.  Musculoskeletal: No lower extremity tenderness nor edema. No gross deformities of extremities. Neurologic:  Normal speech and language. No gross focal neurologic deficits are appreciated.  Skin:  Skin is warm, dry and intact. No rash noted. Psych: tearful, distracted, suicidal but not homicidal, doesn't appear to be responding to internal stimuli  ____________________________________________   LABS (all labs ordered are listed, but only abnormal results are displayed)  Labs Reviewed  COMPREHENSIVE METABOLIC PANEL  ETHANOL  SALICYLATE LEVEL  ACETAMINOPHEN LEVEL  CBC  RAPID URINE DRUG SCREEN, HOSP PERFORMED  I-STAT BETA HCG BLOOD, ED ([facility], [facility], [facility] ONLY)  I-STAT BETA HCG BLOOD, ED ([facility], [facility], [facility] ONLY)   ____________________________________________  RADIOLOGY  No results found.  ____________________________________________   PROCEDURES  Procedure(s) performed:   Procedures   ____________________________________________   INITIAL IMPRESSION / ASSESSMENT AND PLAN / ED COURSE  Suspect will need inpatient admission for suicidal thoughts. Will await labs for official medical clearance.   Labs with low K, will replete but not likely related to her symptoms at this time. otherwise unremarkable. Medically cleared for TTS consultation. Would strongly consider IVC if patient tries to leave without a marked turnaround as I believe her to be high risk.      Pertinent labs & imaging results that were available during my care of the patient were reviewed by me and considered in my medical decision making (see chart for details).  ____________________________________________  FINAL CLINICAL IMPRESSION(S) / ED DIAGNOSES  Final diagnoses:  None     MEDICATIONS GIVEN DURING THIS VISIT:  Medications  acetaminophen (TYLENOL) tablet 650 mg (has no  administration in time range)  ondansetron (ZOFRAN) tablet 4 mg (has no administration in time range)  alum & mag hydroxide-simeth (MAALOX/MYLANTA) 200-200-20 MG/5ML suspension 30 mL (has no administration in time range)  nicotine (NICODERM CQ - dosed in mg/24 hours) patch 21 mg (has no administration in time range)     NEW OUTPATIENT MEDICATIONS STARTED DURING THIS VISIT:  New Prescriptions   No medications on file    Note:  This note was prepared with assistance of Dragon voice recognition software. Occasional wrong-word or sound-a-like substitutions may have occurred due to the inherent limitations of voice recognition software.   Nixon Kolton, Corene Cornea, MD 03/01/19 [phone]

## 2019-03-01 NOTE — Tx Team (Signed)
Interdisciplinary Treatment and Diagnostic Plan Update  03/01/2019 Time of Session: 9:00am Sophia Garcia MRN: [id number]  Principal Diagnosis: <principal problem not specified>  Secondary Diagnoses: Active Problems:   MDD (major depressive disorder), recurrent, severe, with psychosis ([place])   Current Medications:  Current Facility-Administered Medications  Medication Dose Route Frequency Provider Last Rate Last Dose  . acetaminophen (TYLENOL) tablet 650 mg  650 mg Oral Q6H PRN Cobos, Myer Peer, MD      . alum & mag hydroxide-simeth (MAALOX/MYLANTA) 200-200-20 MG/5ML suspension 30 mL  30 mL Oral Q4H PRN Cobos, Myer Peer, MD      . feeding supplement (ENSURE ENLIVE) (ENSURE ENLIVE) liquid 237 mL  237 mL Oral BID BM Cobos, Fernando A, MD      . magnesium hydroxide (MILK OF MAGNESIA) suspension 30 mL  30 mL Oral Daily PRN Cobos, Fernando A, MD      . nicotine (NICODERM CQ - dosed in mg/24 hours) patch 21 mg  21 mg Transdermal Daily Cobos, Myer Peer, MD   21 mg at 03/01/19 0807   PTA Medications: Medications Prior to Admission  Medication Sig Dispense Refill Last Dose  . ibuprofen (ADVIL) 200 MG tablet Take 800 mg by mouth every 6 (six) hours as needed for headache, mild pain or moderate pain.     Marland Kitchen loratadine-pseudoephedrine (CLARITIN-D 12 HOUR) 5-120 MG tablet Take 1 tablet by mouth 2 (two) times daily. For congestion and cough. (Patient not taking: Reported on 03/01/2019) 20 tablet 0     Patient Stressors: Marital or family conflict Occupational concerns Substance abuse Traumatic event  Patient Strengths: Average or above average intelligence Motivation for treatment/growth Supportive family/friends  Treatment Modalities: Medication Management, Group therapy, Case management,  1 to 1 session with clinician, Psychoeducation, Recreational therapy.   Physician Treatment Plan for Primary Diagnosis: <principal problem not specified> Long Term Goal(s):     Short Term Goals:     Medication Management: Evaluate patient's response, side effects, and tolerance of medication regimen.  Therapeutic Interventions: 1 to 1 sessions, Unit Group sessions and Medication administration.  Evaluation of Outcomes: Not Met  Physician Treatment Plan for Secondary Diagnosis: Active Problems:   MDD (major depressive disorder), recurrent, severe, with psychosis ([place])  Long Term Goal(s):     Short Term Goals:       Medication Management: Evaluate patient's response, side effects, and tolerance of medication regimen.  Therapeutic Interventions: 1 to 1 sessions, Unit Group sessions and Medication administration.  Evaluation of Outcomes: Not Met   RN Treatment Plan for Primary Diagnosis: <principal problem not specified> Long Term Goal(s): Knowledge of disease and therapeutic regimen to maintain health will improve  Short Term Goals: Ability to disclose and discuss suicidal ideas and Ability to identify and develop effective coping behaviors will improve  Medication Management: RN will administer medications as ordered by provider, will assess and evaluate patient's response and provide education to patient for prescribed medication. RN will report any adverse and/or side effects to prescribing provider.  Therapeutic Interventions: 1 on 1 counseling sessions, Psychoeducation, Medication administration, Evaluate responses to treatment, Monitor vital signs and CBGs as ordered, Perform/monitor CIWA, COWS, AIMS and Fall Risk screenings as ordered, Perform wound care treatments as ordered.  Evaluation of Outcomes: Not Met   LCSW Treatment Plan for Primary Diagnosis: <principal problem not specified> Long Term Goal(s): Safe transition to appropriate next level of care at discharge, Engage patient in therapeutic group addressing interpersonal concerns.  Short Term Goals: Engage patient in aftercare planning  with referrals and resources, Increase social support, Identify triggers  associated with mental health/substance abuse issues and Increase skills for wellness and recovery  Therapeutic Interventions: Assess for all discharge needs, 1 to 1 time with Social worker, Explore available resources and support systems, Assess for adequacy in community support network, Educate family and significant other(s) on suicide prevention, Complete Psychosocial Assessment, Interpersonal group therapy.  Evaluation of Outcomes: Not Met   Progress in Treatment: Attending groups: No. New to unit. Participating in groups: No. Taking medication as prescribed: Yes. Toleration medication: Yes. Family/Significant other contact made: No, will contact:  supports if consents are granted. Patient understands diagnosis: Yes. Discussing patient identified problems/goals with staff: Yes. Medical problems stabilized or resolved: Yes. Denies suicidal/homicidal ideation: No. Issues/concerns per patient self-inventory: Yes.  New problem(s) identified: Yes, Describe:  recently separated from spouse  New Short Term/Long Term Goal(s): detox, medication management for mood stabilization; elimination of SI thoughts; development of comprehensive mental wellness/sobriety plan.  Patient Goals:    Discharge Plan or Barriers: CSW continuing to assess  Reason for Continuation of Hospitalization: Anxiety Depression Suicidal ideation Withdrawal symptoms  Estimated Length of Stay: 3-5 days  Attendees: Patient: 03/01/2019 10:07 AM  Physician: Dr.Farah 03/01/2019 10:07 AM  Nursing: Yetta Flock, RN 03/01/2019 10:07 AM  RN Care Manager: 03/01/2019 10:07 AM  Social Worker: Stephanie Acre, [place] 03/01/2019 10:07 AM  Recreational Therapist:  03/01/2019 10:07 AM  Other:  03/01/2019 10:07 AM  Other:  03/01/2019 10:07 AM  Other: 03/01/2019 10:07 AM    Scribe for Treatment Team: Joellen Jersey, [place] 03/01/2019 10:07 AM

## 2019-03-01 NOTE — [facility] Suicide Risk Assessment (Signed)
[facility] Admission Suicide Risk Assessment   Nursing information obtained from:  Patient Demographic factors:  Caucasian, Unemployed Current Mental Status:  Suicidal ideation indicated by patient, Suicidal ideation indicated by others, Self-harm thoughts, Suicide plan Loss Factors:  Loss of significant relationship Historical Factors:  Prior suicide attempts Risk Reduction Factors:  Responsible for children under 34 years of age, Living with another person, especially a relative, Sense of responsibility to family  Total Time spent with patient: 45 minutes Principal Problem: MDD/polydrug abuse Diagnosis:  Active Problems:   MDD (major depressive disorder), recurrent, severe, with psychosis ([place])  Subjective Data: polydrug abuse- recent marital stress   Continued Clinical Symptoms:  Alcohol Use Disorder Identification Test Final Score (AUDIT): 19 The "Alcohol Use Disorders Identification Test", Guidelines for Use in Primary Care, Second Edition.  [employer] [facility]). Score between 0-7:  no or low risk or alcohol related problems. Score between 8-15:  moderate risk of alcohol related problems. Score between 16-19:  high risk of alcohol related problems. Score 20 or above:  warrants further diagnostic evaluation for alcohol dependence and treatment.   CLINICAL FACTORS:   Alcohol/Substance Abuse/Dependencies   Musculoskeletal: Strength & Muscle Tone: within normal limits Gait & Station: normal Patient leans: N/A  Psychiatric Specialty Exam: Physical Exam  ROS  Blood pressure 124/74, pulse 82, temperature 98.6 F (37 C), temperature source Oral, resp. rate 16, height 4\' 11"  (1.499 m), weight 44.5 kg, last menstrual period 01/30/2019.Body mass index is 19.79 kg/m.  General Appearance: Casual and Disheveled  Eye Contact:  Good  Speech:  Clear and Coherent  Volume:  Increased  Mood:  Anxious and Depressed  Affect:  Congruent  Thought Process:  Linear and Descriptions of  Associations: Circumstantial  Orientation:  Full (Time, Place, and Person)  Thought Content:  Logical  Suicidal Thoughts:  Yes.  without intent/plan  Homicidal Thoughts:  No  Memory:  Immediate;   Poor  Judgement:  Good  Insight:  Fair  Psychomotor Activity:  Normal  Concentration:  Concentration: Good and Attention Span: Good  Recall:  Good  Fund of Knowledge:  Good  Language:  Negative  Akathisia:  Negative  Handed:  Right  AIMS (if indicated):     Assets:  Physical Health Resilience Social Support  ADL's:  Intact  Cognition:  WNL  Sleep:         COGNITIVE FEATURES THAT CONTRIBUTE TO RISK:  Polarized thinking    SUICIDE RISK:   Moderate:  Frequent suicidal ideation with limited intensity, and duration, some specificity in terms of plans, no associated intent, good self-control, limited dysphoria/symptomatology, some risk factors present, and identifiable protective factors, including available and accessible social support.  PLAN OF CARE: see eval  I certify that inpatient services furnished can reasonably be expected to improve the patient's condition.   Johnn Hai, MD 03/01/2019, 1:00 PM

## 2019-03-01 NOTE — ED Triage Notes (Signed)
Pt reports SI for 72 hours. She states that she has multiple plans, including getting her husbands pistol, overdosing on aspirin, slitting her wrists, and cutting her inner thighs. Pt is tearful in triage. Reports a recent separation from her husband. Also reports that she has 2 children and she knows they need her.

## 2019-03-01 NOTE — Progress Notes (Signed)
Darlyne Russian, PA recommends inpt tx. TTS to seek placement. EDP [place], Corene Cornea, MD and charge nurse Kallie Locks, RN have been advised. BH to review for possible admission.   Lind Covert, MSW, LCSW Therapeutic Triage Specialist  [phone]

## 2019-03-01 NOTE — Tx Team (Signed)
Initial Treatment Plan 03/01/2019 6:58 AM Sophia Garcia [id number]    PATIENT STRESSORS: Marital or family conflict Occupational concerns Substance abuse Traumatic event   PATIENT STRENGTHS: Average or above average intelligence Motivation for treatment/growth Supportive family/friends   PATIENT IDENTIFIED PROBLEMS: Depression  Suicidal Ideation  Substance abuse      "Regain my mental stability"           DISCHARGE CRITERIA:  Improved stabilization in mood, thinking, and/or behavior Motivation to continue treatment in a less acute level of care Need for constant or close observation no longer present Verbal commitment to aftercare and medication compliance Withdrawal symptoms are absent or subacute and managed without 24-hour nursing intervention  PRELIMINARY DISCHARGE PLAN: Attend 12-step recovery group Outpatient therapy Return to previous living arrangement  PATIENT/FAMILY INVOLVEMENT: This treatment plan has been presented to and reviewed with the patient, Sophia Garcia,.  The patient and family have been given the opportunity to ask questions and make suggestions.  Margaretann Loveless, RN 03/01/2019, 6:58 AM

## 2019-03-01 NOTE — Progress Notes (Signed)
Nutrition Brief Note RD working remotely.   Patient identified on the Malnutrition Screening Tool (MST) Report  Wt Readings from Last 15 Encounters:  03/01/19 44.5 kg  02/28/19 45.4 kg  02/03/19 45.4 kg  09/20/18 45.4 kg  08/27/18 45.4 kg  06/22/18 45.6 kg  05/10/18 45.8 kg  12/15/17 45.8 kg  10/16/17 43.5 kg  04/16/17 40.4 kg  04/30/16 45.4 kg  04/26/16 41.7 kg  10/29/15 44.5 kg  07/22/15 43.1 kg  05/13/15 43.1 kg    Body mass index is 19.79 kg/m. Patient meets criteria for normal weight based on current BMI. Weight has been stable for the past 3.5 years. Patient admitted for depression, SI, and substance abuse.  Current diet order is Regular and patient is eating as desired for meals and snacks at this time. Labs and medications reviewed.   Ensure Enlive was ordered BID per ONS protocol to start this AM. No nutrition interventions warranted at this time. If nutrition issues arise, please consult RD.     Jarome Matin, MS, RD, LDN, [facility] Inpatient Clinical Dietitian Pager # [phone] After hours/weekend pager # [phone]

## 2019-03-01 NOTE — Progress Notes (Signed)
Pt accepted to [facility] 304-2 pending negative Covid test. Charge nurse Kallie Locks, RN has been advised. Call to report [phone]. Attending provider will be Dr. Parke Poisson, MD.   Lind Covert, MSW, [place] Therapeutic Triage Specialist  [phone]

## 2019-03-02 MED ORDER — QUETIAPINE FUMARATE 50 MG PO TABS
150.0000 mg | ORAL_TABLET | Freq: Every day | ORAL | Status: DC
  Administered 2019-03-02 – 2019-03-03 (×2): 150 mg via ORAL
  Filled 2019-03-02 (×3): qty 3
  Filled 2019-03-02 (×2): qty 42

## 2019-03-02 NOTE — Progress Notes (Signed)
Patient ID: Sophia Garcia, female   DOB: 1985/09/17, 33 y.o.   MRN: [id number]  Nursing Progress Note [phone]  Patient presents anxious with sad/sullen affect. Patient compliant with scheduled medications. Patient is seen attending groups and visible in the milieu. Patient currently denies current SI/HI/AVH but states she is still not ready for discharge.   Patient is educated about and provided medication per provider's orders. Patient safety maintained with q15 min safety checks and fall risk precautions. Emotional support given, 1:1 interaction, and active listening provided. Patient encouraged to attend meals, groups, and work on treatment plan and goals. Labs, vital signs and patient behavior monitored throughout shift. Patient encouraged to wear mask when in the milieu and is educated about coronavirus infection control precautions.  Patient compliant with mask. Patient contracts for safety with staff. Patient remains safe on the unit at this time and agrees to come to staff with any issues/concerns. Patient is interacting with peers appropriately on the unit. Will continue to support and monitor.

## 2019-03-02 NOTE — Care Management (Signed)
CMA sent referral to [facility] and [facility]. CMA will follow up with facilities and notify [place], [place].      Ammarie Matsuura Care Management Assistant  Email:[email or web address] Office: [phone]

## 2019-03-02 NOTE — Progress Notes (Signed)
D: Pt denies SI/HI/AV hallucinations. Pt is pleasant and cooperative. Pt goal for today is to get better. A: Pt was offered support and encouragement. Pt was given scheduled medications. Pt was encourage to attend groups. Q 15 minute checks were done for safety.  R:Pt attends groups and interacts well with peers and staff. Pt is taking medication. Pt has no complaints.Pt receptive to treatment and safety maintained on unit.

## 2019-03-02 NOTE — Progress Notes (Signed)
Pt has request to speak with the Chaplain. Writer called [facility] requesting Jerene Pitch. Medical laboratory scientific officer. Waiting for a call back.

## 2019-03-02 NOTE — Progress Notes (Signed)
[facility] MD Progress Note  03/02/2019 10:48 AM Sophia Garcia  MRN:  [id number] Subjective:   Patient complains of no acute withdrawal but does complain of insomnia and ruminations denies thoughts of harming self today feels she needs at least 10 days of rehab to be prepared to go home Principal Problem: poly subst. Dep/MDD Diagnosis: Active Problems:   MDD (major depressive disorder), recurrent, severe, with psychosis (HCC)  Total Time spent with patient: 20 minutes  Past Psychiatric History: poly drug dep  Past Medical History:  Past Medical History:  Diagnosis Date  . BV (bacterial vaginosis)   . HPV (human papilloma virus) infection   . Normal pregnancy, incidental 12/23/2011  . Preterm labor   . Yeast infection     Past Surgical History:  Procedure Laterality Date  . CESAREAN SECTION    . CESAREAN SECTION  06/12/2012   Procedure: CESAREAN SECTION;  Surgeon: Kirkland HunArthur Stringer, MD;  Location: [facility];  Service: Obstetrics;  Laterality: N/A;  Repeat  . CHOLECYSTECTOMY N/A 01/03/2014   Procedure: LAPAROSCOPIC CHOLECYSTECTOMY;  Surgeon: Dalia HeadingMark A Jenkins, MD;  Location: [facility];  Service: General;  Laterality: N/A;  . FOOT SURGERY  2002   STEPPED ON SEWING NEEDLE  . TOOTH EXTRACTION  2009   ONE TOOTH  . TUBAL LIGATION     Family History:  Family History  Problem Relation Age of Onset  . Diabetes Father        ORAL MEDS  . Cancer Paternal Aunt        BREAST;LIVER  . Diabetes Paternal Aunt   . Diabetes Paternal Uncle   . COPD Paternal Uncle   . Cancer Maternal Grandmother        BREAST  . COPD Mother        EMPHYSEMA  . Diabetes Paternal Grandfather   . Other Neg Hx     Social History:  Social History   Substance and Sexual Activity  Alcohol Use Yes   Comment: occasionally     Social History   Substance and Sexual Activity  Drug Use Yes  . Types: Marijuana   Comment: occasionally    Social History   Socioeconomic History  . Marital status: Married    Spouse  name: Not on file  . Number of children: Not on file  . Years of education: 529  . Highest education level: Not on file  Occupational History  . Not on file  Social Needs  . Financial resource strain: Patient refused  . Food insecurity    Worry: Patient refused    Inability: Patient refused  . Transportation needs    Medical: Patient refused    Non-medical: Patient refused  Tobacco Use  . Smoking status: Current Every Day Smoker    Packs/day: 0.50    Years: 12.00    Pack years: 6.00    Types: Cigarettes  . Smokeless tobacco: Never Used  . Tobacco comment: 1 pack lasts 4 days  Substance and Sexual Activity  . Alcohol use: Yes    Comment: occasionally  . Drug use: Yes    Types: Marijuana    Comment: occasionally  . Sexual activity: Yes    Partners: Male    Birth control/protection: Surgical    Comment: BTL   Lifestyle  . Physical activity    Days per week: Patient refused    Minutes per session: Patient refused  . Stress: Very much  Relationships  . Social connections    Talks on phone: Patient refused  Gets together: Patient refused    Attends religious service: Patient refused    Active member of club or organization: Patient refused    Attends meetings of clubs or organizations: Patient refused    Relationship status: Patient refused  Other Topics Concern  . Not on file  Social History Narrative  . Not on file   Additional Social History:                         Sleep: Good  Appetite:  Good  Current Medications: Current Facility-Administered Medications  Medication Dose Route Frequency Provider Last Rate Last Dose  . acetaminophen (TYLENOL) tablet 650 mg  650 mg Oral Q6H PRN Cobos, Myer Peer, MD      . alum & mag hydroxide-simeth (MAALOX/MYLANTA) 200-200-20 MG/5ML suspension 30 mL  30 mL Oral Q4H PRN Cobos, Myer Peer, MD      . busPIRone (BUSPAR) tablet 15 mg  15 mg Oral TID Johnn Hai, MD   15 mg at 03/02/19 0814  . feeding supplement  (ENSURE ENLIVE) (ENSURE ENLIVE) liquid 237 mL  237 mL Oral BID BM Cobos, Myer Peer, MD   237 mL at 03/02/19 0950  . FLUoxetine (PROZAC) capsule 20 mg  20 mg Oral Daily Johnn Hai, MD   20 mg at 03/02/19 0814  . magnesium hydroxide (MILK OF MAGNESIA) suspension 30 mL  30 mL Oral Daily PRN Cobos, Fernando A, MD      . nicotine (NICODERM CQ - dosed in mg/24 hours) patch 21 mg  21 mg Transdermal Daily Cobos, Myer Peer, MD   21 mg at 03/02/19 0814  . QUEtiapine (SEROQUEL) tablet 150 mg  150 mg Oral QHS Johnn Hai, MD      . valACYclovir Estell Harpin) tablet 1,000 mg  1,000 mg Oral Daily Johnn Hai, MD   1,000 mg at 03/01/19 1622    Lab Results:  Results for orders placed or performed during the hospital encounter of 02/28/19 (from the past 48 hour(s))  Comprehensive metabolic panel     Status: Abnormal   Collection Time: 03/01/19 12:12 AM  Result Value Ref Range   Sodium 140 135 - 145 mmol/L   Potassium 3.0 (L) 3.5 - 5.1 mmol/L   Chloride 108 98 - 111 mmol/L   CO2 21 (L) 22 - 32 mmol/L   Glucose, Bld 95 70 - 99 mg/dL   BUN 16 6 - 20 mg/dL   Creatinine, Ser 0.68 0.44 - 1.00 mg/dL   Calcium 9.2 8.9 - 10.3 mg/dL   Total Protein 7.9 6.5 - 8.1 g/dL   Albumin 2.0 (L) 3.5 - 5.0 g/dL   AST 15 15 - 41 U/L   ALT 12 0 - 44 U/L   Alkaline Phosphatase 26 (L) 38 - 126 U/L   Total Bilirubin 0.9 0.3 - 1.2 mg/dL   GFR calc non Af Amer >60 >60 mL/min   GFR calc Af Amer >60 >60 mL/min   Anion gap 11 5 - 15    Comment: Performed at [facility], [place] [address], [place], [place] [address]  Ethanol     Status: None   Collection Time: 03/01/19 12:12 AM  Result Value Ref Range   Alcohol, Ethyl (B) <10 <10 mg/dL    Comment: (NOTE) Lowest detectable limit for serum alcohol is 10 mg/dL. For medical purposes only. Performed at [facility], [place] [address] [place], [place] [address]   Salicylate level  Status: None   Collection Time: 03/01/19 12:12 AM  Result  Value Ref Range   Salicylate Lvl <7.0 2.8 - 30.0 mg/dL    Comment: Performed at [facility], [address] [place], [place] [address]  Acetaminophen level     Status: Abnormal   Collection Time: 03/01/19 12:12 AM  Result Value Ref Range   Acetaminophen (Tylenol), Serum <10 (L) 10 - 30 ug/mL    Comment: (NOTE) Therapeutic concentrations vary significantly. A range of 10-30 ug/mL  may be an effective concentration for many patients. However, some  are best treated at concentrations outside of this range. Acetaminophen concentrations >150 ug/mL at 4 hours after ingestion  and >50 ug/mL at 12 hours after ingestion are often associated with  toxic reactions. Performed at [facility], [address] [place], [place] [address]   cbc     Status: Abnormal   Collection Time: 03/01/19 12:12 AM  Result Value Ref Range   WBC 8.8 4.0 - 10.5 K/uL   RBC 4.19 3.87 - 5.11 MIL/uL   Hemoglobin 12.8 12.0 - 15.0 g/dL   HCT 09.839.0 11.936.0 - 14.746.0 %   MCV 93.1 80.0 - 100.0 fL   MCH 30.5 26.0 - 34.0 pg   MCHC 32.8 30.0 - 36.0 g/dL   RDW 82.917.2 (H) 56.211.5 - 13.015.5 %   Platelets 419 (H) 150 - 400 K/uL   nRBC 0.0 0.0 - 0.2 %    Comment: Performed at [facility], [address] [place], [place] [address]  Rapid urine drug screen (hospital performed)     Status: Abnormal   Collection Time: 03/01/19 12:12 AM  Result Value Ref Range   Opiates NONE DETECTED NONE DETECTED   Cocaine NONE DETECTED NONE DETECTED   Benzodiazepines NONE DETECTED NONE DETECTED   Amphetamines NONE DETECTED NONE DETECTED   Tetrahydrocannabinol POSITIVE (A) NONE DETECTED   Barbiturates NONE DETECTED NONE DETECTED    Comment: (NOTE) DRUG SCREEN FOR MEDICAL PURPOSES ONLY.  IF CONFIRMATION IS NEEDED FOR ANY PURPOSE, NOTIFY LAB WITHIN 5 DAYS. LOWEST DETECTABLE LIMITS FOR URINE DRUG SCREEN Drug Class                     Cutoff (ng/mL) Amphetamine and metabolites     1000 Barbiturate and metabolites    200 Benzodiazepine                 200 Tricyclics and metabolites     300 Opiates and metabolites        300 Cocaine and metabolites        300 THC                            50 Performed at [facility], [address] [place], [place] [address]   I-Stat beta hCG blood, ED     Status: None   Collection Time: 03/01/19 12:36 AM  Result Value Ref Range   I-stat hCG, quantitative <5.0 <5 mIU/mL   Comment 3            Comment:   GEST. AGE      CONC.  (mIU/mL)   <=1 WEEK        5 - 50     2 WEEKS       50 - 500     3 WEEKS       100 - 10,000  4 WEEKS     1,000 - 30,000        FEMALE AND NON-PREGNANT FEMALE:     LESS THAN 5 mIU/mL   Magnesium     Status: None   Collection Time: 03/01/19  2:14 AM  Result Value Ref Range   Magnesium 2.2 1.7 - 2.4 mg/dL    Comment: Performed at [facility], [address], [place], [place] [address]  SARS Coronavirus 2 (CEPHEID - Performed in [facility] hospital lab), Hosp Order     Status: None   Collection Time: 03/01/19  2:54 AM   Specimen: Nasopharyngeal Swab  Result Value Ref Range   SARS Coronavirus 2 NEGATIVE NEGATIVE    Comment: (NOTE) If result is NEGATIVE SARS-CoV-2 target nucleic acids are NOT DETECTED. The SARS-CoV-2 RNA is generally detectable in upper and lower  respiratory specimens during the acute phase of infection. The lowest  concentration of SARS-CoV-2 viral copies this assay can detect is 250  copies / mL. A negative result does not preclude SARS-CoV-2 infection  and should not be used as the sole basis for treatment or other  patient management decisions.  A negative result may occur with  improper specimen collection / handling, submission of specimen other  than nasopharyngeal swab, presence of viral mutation(s) within the  areas targeted by this assay, and inadequate number of viral copies  (<250 copies / mL). A negative result must be combined  with clinical  observations, patient history, and epidemiological information. If result is POSITIVE SARS-CoV-2 target nucleic acids are DETECTED. The SARS-CoV-2 RNA is generally detectable in upper and lower  respiratory specimens dur ing the acute phase of infection.  Positive  results are indicative of active infection with SARS-CoV-2.  Clinical  correlation with patient history and other diagnostic information is  necessary to determine patient infection status.  Positive results do  not rule out bacterial infection or co-infection with other viruses. If result is PRESUMPTIVE POSTIVE SARS-CoV-2 nucleic acids MAY BE PRESENT.   A presumptive positive result was obtained on the submitted specimen  and confirmed on repeat testing.  While 2019 novel coronavirus  (SARS-CoV-2) nucleic acids may be present in the submitted sample  additional confirmatory testing may be necessary for epidemiological  and / or clinical management purposes  to differentiate between  SARS-CoV-2 and other Sarbecovirus currently known to infect humans.  If clinically indicated additional testing with an alternate test  methodology [phone]) is advised. The SARS-CoV-2 RNA is generally  detectable in upper and lower respiratory sp ecimens during the acute  phase of infection. The expected result is Negative. Fact Sheet for Patients:  BoilerBrush.com.cy Fact Sheet for Healthcare Providers: https://pope.com/ This test is not yet approved or cleared by the [place] FDA and has been authorized for detection and/or diagnosis of SARS-CoV-2 by FDA under an Emergency Use Authorization (EUA).  This EUA will remain in effect (meaning this test can be used) for the duration of the COVID-19 declaration under Section 564(b)(1) of the Act, 21 U.S.C. section 360bbb-3(b)(1), unless the authorization is terminated or revoked sooner. Performed at [facility], [address], [place], [place] [address]     Blood Alcohol level:  Lab Results  Component Value Date   ETH <10 03/01/2019    Metabolic Disorder Labs: No results found for: HGBA1C, MPG No results found for: PROLACTIN No results found for: CHOL, TRIG, HDL, CHOLHDL, VLDL, LDLCALC  Physical Findings: AIMS: Facial and Oral Movements Muscles  of Facial Expression: None, normal Lips and Perioral Area: None, normal Jaw: None, normal Tongue: None, normal,Extremity Movements Upper (arms, wrists, hands, fingers): None, normal Lower (legs, knees, ankles, toes): None, normal, Trunk Movements Neck, shoulders, hips: None, normal, Overall Severity Severity of abnormal movements (highest score from questions above): None, normal Incapacitation due to abnormal movements: None, normal Patient's awareness of abnormal movements (rate only patient's report): No Awareness, Dental Status Current problems with teeth and/or dentures?: No Does patient usually wear dentures?: No  CIWA:    COWS:     Musculoskeletal: Strength & Muscle Tone: within normal limits Gait & Station: normal Patient leans: N/A  Psychiatric Specialty Exam: Physical Exam  ROS  Blood pressure 111/77, pulse 81, temperature 97.9 F (36.6 C), temperature source Oral, resp. rate 16, height 4\' 11"  (1.499 m), weight 44.5 kg, last menstrual period 01/30/2019, SpO2 97 %.Body mass index is 19.79 kg/m.  General Appearance: Casual  Eye Contact:  Good  Speech:  Clear and Coherent  Volume:  Decreased  Mood:  Dysphoric  Affect:  Congruent and Constricted  Thought Process:  Linear and Descriptions of Associations: Tangential  Orientation:  Full (Time, Place, and Person)  Thought Content:  Logical and Rumination  Suicidal Thoughts:  No  Homicidal Thoughts:  No  Memory:  Immediate;   Good  Judgement:  Good  Insight:  Good  Psychomotor Activity:  Normal  Concentration:  Concentration: Fair  Recall:  Fair  Fund of  Knowledge:  Fair  Language:  Fair  Akathisia:  Negative  Handed:  Right  AIMS (if indicated):     Assets:  Leisure Time Physical Health Resilience  ADL's:  Intact  Cognition:  WNL  Sleep:  Number of Hours: 6.75     Treatment Plan Summary: Daily contact with patient to assess and evaluate symptoms and progress in treatment and Medication management continue to monitor for withdrawal add Seroquel for sleep- No change in precautions  Loraina Stauffer, MD 03/02/2019, 10:48 AM

## 2019-03-02 NOTE — Plan of Care (Signed)
D: Patient is alert, oriented, pleasant, tearful at times, and cooperative. Endorses passive SI. Denies HI, AVH, and verbally contracts for safety.    A: Medications administered per MD order. Support provided. Patient educated on safety on the unit and medications. Routine safety checks every 15 minutes. Patient stated understanding to tell nurse about any new physical symptoms. Patient understands to tell staff of any needs.     R: No adverse drug reactions noted. Patient verbally contracts for safety. Patient remains safe at this time and will continue to monitor.   Problem: Safety: Goal: Periods of time without injury will increase Outcome: Progressing   Patient remains safe and will continue to monitor.   [place] NOVEL CORONAVIRUS (COVID-19) DAILY CHECK-OFF SYMPTOMS - answer yes or no to each - every day NO YES  Have you had a fever in the past 24 hours?  Fever (Temp > 37.80C / 100F) X   Have you had any of these symptoms in the past 24 hours? New Cough  Sore Throat   Shortness of Breath  Difficulty Breathing  Unexplained Body Aches   X   Have you had any one of these symptoms in the past 24 hours not related to allergies?   Runny Nose  Nasal Congestion  Sneezing   X   If you have had runny nose, nasal congestion, sneezing in the past 24 hours, has it worsened?  X   EXPOSURES - check yes or no X   Have you traveled outside the state in the past 14 days?  X   Have you been in contact with someone with a confirmed diagnosis of COVID-19 or PUI in the past 14 days without wearing appropriate PPE?  X   Have you been living in the same home as a person with confirmed diagnosis of COVID-19 or a PUI (household contact)?    X   Have you been diagnosed with COVID-19?    X              What to do next: Answered NO to all: Answered YES to anything:   Proceed with unit schedule Follow the [facility] Inpatient Flowsheet.

## 2019-03-02 NOTE — Plan of Care (Signed)
  Problem: Safety: Goal: Ability to remain free from injury will improve Outcome: Progressing   

## 2019-03-02 NOTE — [facility] Suicide Risk Assessment (Signed)
[place] INPATIENT:  Family/Significant Other Suicide Prevention Education  Suicide Prevention Education:  Education Completed; husband, Sophia Garcia [phone]  has been identified by the patient as the family member/significant other with whom the patient will be residing, and identified as the person(s) who will aid the patient in the event of a mental health crisis (suicidal ideations/suicide attempt).  With written consent from the patient, the family member/significant other has been provided the following suicide prevention education, prior to the and/or following the discharge of the patient.  The suicide prevention education provided includes the following:  Suicide risk factors  Suicide prevention and interventions  National Suicide Hotline telephone number  [facility] assessment telephone number  [facility] Emergency Assistance [place] and/or Residential Mobile Crisis Unit telephone number  Request made of family/significant other to:  Remove weapons (e.g., guns, rifles, knives), all items previously/currently identified as safety concern.    Remove drugs/medications (over-the-counter, prescriptions, illicit drugs), all items previously/currently identified as a safety concern.  The family member/significant other verbalizes understanding of the suicide prevention education information provided.  The family member/significant other agrees to remove the items of safety concern listed above.    Patient's husband reports that their marriage is over and he cannot allow the patient to return home with their young children due to patient's substance use issues. Patient is aware of this.   Patient's husband has some concerns in regard to suicide, given the end of patient's marriage and not being able to live with her children. His concerns are general, there is not a specific plan.  Patient is expected to go stay at her mother's house at discharge.  Husband states that other family members (brother and father) have pistols, but he states the guns are secured and not accessible to the patient.   Sophia Garcia 03/02/2019, 12:59 PM

## 2019-03-03 MED ORDER — BOOST / RESOURCE BREEZE PO LIQD CUSTOM
1.0000 | Freq: Two times a day (BID) | ORAL | Status: DC
  Administered 2019-03-03 – 2019-03-04 (×2): 1 via ORAL
  Filled 2019-03-03 (×7): qty 1

## 2019-03-03 MED ORDER — HYDROXYZINE HCL 25 MG PO TABS
25.0000 mg | ORAL_TABLET | Freq: Three times a day (TID) | ORAL | Status: DC | PRN
  Administered 2019-03-03: 15:00:00 25 mg via ORAL
  Filled 2019-03-03: qty 1

## 2019-03-03 NOTE — Progress Notes (Addendum)
[facility]  03/03/2019 9:34 AM [name]  MRN:  [id number] Subjective:    Patient is more irritable today she is requesting a prolonged hospital stay she may be staying with her mother there may be housing issues she denies thoughts of harming self can contract for safety here only, she and her roommate seem to be both discussing their cases together and both focused on prolonging hospital stays at any rate the patient does not have acute dangerousness as far as withdrawal symptoms or thoughts of harming self here. Principal Problem: Polysubstance abuse and dependence, no acute withdrawal/adjustment disorder and depression Diagnosis: Active Problems:   MDD (major depressive disorder), recurrent, severe, with psychosis (HCC)  Total Time spent with patient: 20 minutes  Past Medical History:  Past Medical History:  Diagnosis Date  . BV (bacterial vaginosis)   . HPV (human papilloma virus) infection   . Normal pregnancy, incidental 12/23/2011  . Preterm labor   . Yeast infection     Past Surgical History:  Procedure Laterality Date  . CESAREAN SECTION    . CESAREAN SECTION  06/12/2012   Procedure: CESAREAN SECTION;  Surgeon: Kirkland HunArthur Stringer, MD;  [facility]: [facility];  Service: Obstetrics;  Laterality: N/A;  Repeat  . CHOLECYSTECTOMY N/A 01/03/2014   Procedure: LAPAROSCOPIC CHOLECYSTECTOMY;  Surgeon: Dalia HeadingMark A Jenkins, MD;  Location: [facility];  Service: General;  Laterality: N/A;  . FOOT SURGERY  2002   STEPPED ON SEWING NEEDLE  . TOOTH EXTRACTION  2009   ONE TOOTH  . TUBAL LIGATION     Family History:  Family History  Problem Relation Age of Onset  . Diabetes Father        ORAL MEDS  . Cancer Paternal Aunt        BREAST;LIVER  . Diabetes Paternal Aunt   . Diabetes Paternal Uncle   . COPD Paternal Uncle   . Cancer Maternal Grandmother        BREAST  . COPD Mother        EMPHYSEMA  . Diabetes Paternal Grandfather   . Other Neg Hx    Family Psychiatric  History: see  eval Social History:  Social History   Substance and Sexual Activity  Alcohol Use Yes   Comment: occasionally     Social History   Substance and Sexual Activity  Drug Use Yes  . Types: Marijuana   Comment: occasionally    Social History   Socioeconomic History  . Marital status: Married    Spouse name: Not on file  . Number of children: Not on file  . Years of education: 379  . Highest education level: Not on file  Occupational History  . Not on file  Social Needs  . Financial resource strain: Patient refused  . Food insecurity    Worry: Patient refused    Inability: Patient refused  . Transportation needs    Medical: Patient refused    Non-medical: Patient refused  Tobacco Use  . Smoking status: Current Every Day Smoker    Packs/day: 0.50    Years: 12.00    Pack years: 6.00    Types: Cigarettes  . Smokeless tobacco: Never Used  . Tobacco comment: 1 pack lasts 4 days  Substance and Sexual Activity  . Alcohol use: Yes    Comment: occasionally  . Drug use: Yes    Types: Marijuana    Comment: occasionally  . Sexual activity: Yes    Partners: Male    Birth control/protection:  Surgical    Comment: BTL   Lifestyle  . Physical activity    Days per week: Patient refused    Minutes per session: Patient refused  . Stress: Very much  Relationships  . Social Musicianconnections    Talks on phone: Patient refused    Gets together: Patient refused    Attends religious service: Patient refused    Active member of club or organization: Patient refused    Attends meetings of clubs or organizations: Patient refused    Relationship status: Patient refused  Other Topics Concern  . Not on file  Social History Narrative  . Not on file   Additional Social History:                         Sleep: Good  Appetite:  Good  Current Medications: Current Facility-Administered Medications  Medication Dose Route Frequency Provider Last Rate Last Dose  . acetaminophen  (TYLENOL) tablet 650 mg  650 mg Oral Q6H PRN Cobos, Rockey SituFernando A, MD   650 mg at 03/02/19 2129  . alum & mag hydroxide-simeth (MAALOX/MYLANTA) 200-200-20 MG/5ML suspension 30 mL  30 mL Oral Q4H PRN Cobos, Rockey SituFernando A, MD      . busPIRone (BUSPAR) tablet 15 mg  15 mg Oral TID Malvin JohnsFarah, Deberah Adolf, MD   15 mg at 03/03/19 0807  . feeding supplement (ENSURE ENLIVE) (ENSURE ENLIVE) liquid 237 mL  237 mL Oral BID BM Cobos, Rockey SituFernando A, MD   237 mL at 03/02/19 0950  . FLUoxetine (PROZAC) capsule 20 mg  20 mg Oral Daily Malvin JohnsFarah, Agastya Meister, MD   20 mg at 03/03/19 0807  . magnesium hydroxide (MILK OF MAGNESIA) suspension 30 mL  30 mL Oral Daily PRN Cobos, Fernando A, MD      . nicotine (NICODERM CQ - dosed in mg/24 hours) patch 21 mg  21 mg Transdermal Daily Cobos, Rockey SituFernando A, MD   21 mg at 03/03/19 0806  . QUEtiapine (SEROQUEL) tablet 150 mg  150 mg Oral QHS Malvin JohnsFarah, Kimberlee Shoun, MD   150 mg at 03/02/19 2129  . valACYclovir (VALTREX) tablet 1,000 mg  1,000 mg Oral Daily Malvin JohnsFarah, Lauranne Beyersdorf, MD   1,000 mg at 03/03/19 16100807    Lab Results: No results found for this or any previous visit (from the past 48 hour(s)).  Blood Alcohol level:  Lab Results  Component Value Date   ETH <10 03/01/2019    Metabolic Disorder Labs: No results found for: HGBA1C, MPG No results found for: PROLACTIN No results found for: CHOL, TRIG, HDL, CHOLHDL, VLDL, LDLCALC  Physical Findings: AIMS: Facial and Oral Movements Muscles of Facial Expression: None, normal Lips and Perioral Area: None, normal Jaw: None, normal Tongue: None, normal,Extremity Movements Upper (arms, wrists, hands, fingers): None, normal Lower (legs, knees, ankles, toes): None, normal, Trunk Movements Neck, shoulders, hips: None, normal, Overall Severity Severity of abnormal movements (highest score from questions above): None, normal Incapacitation due to abnormal movements: None, normal Patient's awareness of abnormal movements (rate only patient's report): No Awareness, Dental  Status Current problems with teeth and/or dentures?: No Does patient usually wear dentures?: No  CIWA:    COWS:     Musculoskeletal: Strength & Muscle Tone: within normal limits Gait & Station: normal Patient leans: N/A  Psychiatric Specialty Exam: Physical Exam  ROS  Blood pressure 111/77, pulse 81, temperature 97.9 F (36.6 C), temperature source Oral, resp. rate 16, height 4\' 11"  (1.499 m), weight 44.5 kg, last menstrual period 01/30/2019,  SpO2 97 %.Body mass index is 19.79 kg/m.  General Appearance: Casual  Eye Contact:  Good  Speech:  Clear and Coherent  Volume:  Normal  Mood:  Angry and Irritable  Affect:  Appropriate and Congruent  Thought Process:  Coherent and Descriptions of Associations: Circumstantial  Orientation:  Full (Time, Place, and Person)  Thought Content:  Logical  Suicidal Thoughts:  No  Homicidal Thoughts:  No  Memory:  Immediate;   Fair  Judgement:  Fair  Insight:  Fair  Psychomotor Activity:  Normal  Concentration:  Concentration: Good  Recall:  [place] of Knowledge:  Fair  Language:  Fair  Akathisia:  Negative  Handed:  Right  AIMS (if indicated):     Assets:  Physical Health Resilience  ADL's:  Intact  Cognition:  WNL  Sleep:  Number of Hours: 6.75     Treatment Plan Summary: Daily contact with patient to assess and evaluate symptoms and progress in treatment, Medication management and Plan Continue current regimen without change continue cognitive and rehab based therapies probable discharge tomorrow Patient caught me in the hallway long after rounds  she was med seeking stating she was anxious and having withdrawal insisting on a test for Lyme disease insisting on a test for anemia, again she is instructed that we do not do around the second time in the hallway that is not in private and so forth but she was relentless we did order some Elayne Guerin, MD 03/03/2019, 9:34 AM

## 2019-03-03 NOTE — Care Management (Signed)
Patient has been accepted to [facility] for Thursday, 7/16 at 11:00a.   CMA has notified CSW, [place].      Jiro Kiester Care Management Assistant  Email:[email or web address] Office: [phone]

## 2019-03-03 NOTE — Plan of Care (Addendum)
Patient self inventory- patient slept fair last night, sleep medication was helpful. Appetite is poor, energy level low, concentration poor. Depression, hopelessness, and anxiety rated 9, 6, 7 out of 10. Patient rated pain 8/10 in left hip and right knee. Patient's goal is "getting my mental state in check. I need to stay at least two more days." Patient also wrote "Please don't send me home today because I'm not ready yet and I don't know how the meds work for me."  This information was told to MD and it was decided that patient will leave tomorrow. Patient is looking forward to it since she is going to a rehab.   Problem: Education: Goal: Knowledge of [facility] Education information/materials will improve Outcome: Progressing Goal: Emotional status will improve Outcome: Progressing Goal: Mental status will improve Outcome: Progressing Goal: Verbalization of understanding the information provided will improve Outcome: Progressing   Problem: Activity: Goal: Interest or engagement in activities will improve Outcome: Progressing

## 2019-03-03 NOTE — Plan of Care (Signed)
D: Patient is alert, oriented, pleasant, and cooperative. Denies SI, HI, AVH, and verbally contracts for safety.    A: Medications administered per MD order. Support provided. Patient educated on safety on the unit and medications. Routine safety checks every 15 minutes. Patient stated understanding to tell nurse about any new physical symptoms. Patient understands to tell staff of any needs.     R: No adverse drug reactions noted. Patient verbally contracts for safety. Patient remains safe at this time and will continue to monitor.   Problem: Safety: Goal: Periods of time without injury will increase Outcome: Progressing  [place] NOVEL CORONAVIRUS (COVID-19) DAILY CHECK-OFF SYMPTOMS - answer yes or no to each - every day NO YES  Have you had a fever in the past 24 hours?  Fever (Temp > 37.80C / 100F) X   Have you had any of these symptoms in the past 24 hours? New Cough  Sore Throat   Shortness of Breath  Difficulty Breathing  Unexplained Body Aches   X   Have you had any one of these symptoms in the past 24 hours not related to allergies?   Runny Nose  Nasal Congestion  Sneezing   X   If you have had runny nose, nasal congestion, sneezing in the past 24 hours, has it worsened?  X   EXPOSURES - check yes or no X   Have you traveled outside the state in the past 14 days?  X   Have you been in contact with someone with a confirmed diagnosis of COVID-19 or PUI in the past 14 days without wearing appropriate PPE?  X   Have you been living in the same home as a person with confirmed diagnosis of COVID-19 or a PUI (household contact)?    X   Have you been diagnosed with COVID-19?    X              What to do next: Answered NO to all: Answered YES to anything:   Proceed with unit schedule Follow the [facility] Inpatient Flowsheet.

## 2019-03-03 NOTE — Progress Notes (Signed)
Patient has been informed that she was accepted by [facility] for tomorrow. She is very pleased with this discharge plan. Patient has been informed that she will need to coordinate transportation to [facility] in [place], she reports she will call family this afternoon to coordinate transportation.  She will need a 14 day supply of medications at discharge.  Stephanie Acre, LCSW-A Clinical Social Worker

## 2019-03-03 NOTE — Progress Notes (Signed)
Recreation Therapy Notes  Date:  7.15.20 Time: 0930 Location: 300 Hall Dayroom  Group Topic: Stress Management  Goal Area(s) Addresses:  Patient will identify positive stress management techniques. Patient will identify benefits of using stress management post d/c.  Behavioral Response:  Engaged  Intervention: Stress Management  Activity :  Meditation.  LRT introduced the stress management technique of meditation.  LRT played a meditation that focused on being resilient in the face of adversity.  Patients were to listen and follow along as meditation played.  Education:  Stress Management, Discharge Planning.   Education Outcome: Acknowledges Education  Clinical Observations/Feedback:  Pt attended and participated in group.     Sani Madariaga, LRT/CTRS         Amyra Vantuyl A 03/03/2019 10:58 AM 

## 2019-03-03 NOTE — Care Management (Signed)
Patient is on the waiting list for possible admission. Admissions will contact patient when a date becomes available. Patient can also follow up if she wishes to after discharge.   CMA will notify [place], [place].      Baran Kuhrt Care Management Assistant  Email:[email or web address] Office: [phone]

## 2019-03-04 MED ORDER — QUETIAPINE FUMARATE 50 MG PO TABS: 150.0000 mg | ORAL_TABLET | Freq: Every day | ORAL | 1 refills | Status: DC

## 2019-03-04 MED ORDER — BUSPIRONE HCL 15 MG PO TABS: 15.0000 mg | ORAL_TABLET | Freq: Three times a day (TID) | ORAL | 2 refills | Status: DC

## 2019-03-04 MED ORDER — FLUOXETINE HCL 20 MG PO CAPS: 20.0000 mg | ORAL_CAPSULE | Freq: Every day | ORAL | 1 refills | Status: DC

## 2019-03-04 MED ORDER — VALACYCLOVIR HCL 1 G PO TABS: 1000.0000 mg | ORAL_TABLET | Freq: Every day | ORAL | 1 refills | Status: AC

## 2019-03-04 NOTE — Discharge Summary (Signed)
Physician Discharge Summary Note  Patient:  Sophia Garcia is an 33 y.o., female MRN:  [id number] DOB:  01-03-1986 Patient phone:  [phone] (home)  Patient address:   [address] [place] [id number],  Total Time spent with patient: 15 minutes  Date of Admission:  03/01/2019 Date of Discharge: 03/04/19  Reason for Admission:  Polysubstance abuse with suicidal ideation  Principal Problem: <principal problem not specified> Discharge Diagnoses: Active Problems:   MDD (major depressive disorder), recurrent, severe, with psychosis (HCC)   Past Psychiatric History: History of polysubstance abuse since age 33. Denies prior hospitalizations.  Past Medical History:  Past Medical History:  Diagnosis Date  . BV (bacterial vaginosis)   . HPV (human papilloma virus) infection   . Normal pregnancy, incidental 12/23/2011  . Preterm labor   . Yeast infection     Past Surgical History:  Procedure Laterality Date  . CESAREAN SECTION    . CESAREAN SECTION  06/12/2012   Procedure: CESAREAN SECTION;  Surgeon: Kirkland HunArthur Stringer, MD;  Location: [facility];  Service: Obstetrics;  Laterality: N/A;  Repeat  . CHOLECYSTECTOMY N/A 01/03/2014   Procedure: LAPAROSCOPIC CHOLECYSTECTOMY;  Surgeon: Dalia HeadingMark A Jenkins, MD;  Location: [facility];  Service: General;  Laterality: N/A;  . FOOT SURGERY  2002   STEPPED ON SEWING NEEDLE  . TOOTH EXTRACTION  2009   ONE TOOTH  . TUBAL LIGATION     Family History:  Family History  Problem Relation Age of Onset  . Diabetes Father        ORAL MEDS  . Cancer Paternal Aunt        BREAST;LIVER  . Diabetes Paternal Aunt   . Diabetes Paternal Uncle   . COPD Paternal Uncle   . Cancer Maternal Grandmother        BREAST  . COPD Mother        EMPHYSEMA  . Diabetes Paternal Grandfather   . Other Neg Hx    Family Psychiatric  History: Denies Social History:  Social History   Substance and Sexual Activity  Alcohol Use Yes   Comment: occasionally      Social History   Substance and Sexual Activity  Drug Use Yes  . Types: Marijuana   Comment: occasionally    Social History   Socioeconomic History  . Marital status: Married    Spouse name: Not on file  . Number of children: Not on file  . Years of education: 179  . Highest education level: Not on file  Occupational History  . Not on file  Social Needs  . Financial resource strain: Patient refused  . Food insecurity    Worry: Patient refused    Inability: Patient refused  . Transportation needs    Medical: Patient refused    Non-medical: Patient refused  Tobacco Use  . Smoking status: Current Every Day Smoker    Packs/day: 0.50    Years: 12.00    Pack years: 6.00    Types: Cigarettes  . Smokeless tobacco: Never Used  . Tobacco comment: 1 pack lasts 4 days  Substance and Sexual Activity  . Alcohol use: Yes    Comment: occasionally  . Drug use: Yes    Types: Marijuana    Comment: occasionally  . Sexual activity: Yes    Partners: Male    Birth control/protection: Surgical    Comment: BTL   Lifestyle  . Physical activity    Days per week: Patient refused  Minutes per session: Patient refused  . Stress: Very much  Relationships  . Social Musicianconnections    Talks on phone: Patient refused    Gets together: Patient refused    Attends religious service: Patient refused    Active member of club or organization: Patient refused    Attends meetings of clubs or organizations: Patient refused    Relationship status: Patient refused  Other Topics Concern  . Not on file  Social History Narrative  . Not on file    Hospital Course:  From admission assessment: Sophia Garcia is an 33 y.o. female who presents to the ED voluntarily. Pt reports SI with a plan to shoot herself with her husbands gun. Pt reports she had impulses twice PTA to grab the gun and shoot herself, so she asked her mother to take the gun out of the house. Pt states for the past 3 days she has been feeling  suicidal and she has been getting progressively worse. Pt identifies her stressors as her husband asking for a divorce after she cheated on him with his cousin and contracted an STD from his cousin, unemployment, and abusing drugs. Pt states she has been using meth for the past year and a half without her husband's knowledge. Pt states she has not seen her children in 4 days because of the incident with her husband. Pt states she has been at her mothers home for the past several days. Pt states she feels tremendous loss and worthlessness. Pt endorses feelings of hopelessness, guilt, and shame. Pt states she has never felt this bad. Pt states she has not eaten or slept in several days. Pt reports 1 prior suicide attempt in which she slit her wrists. Pt states she did not seek OPT or inpt tx at that time. Pt endorses excessive drug use including meth, pain pills, cannabis, alcohol, and cocaine. Pt states she tried heroin once but did not prefer it. Pt continues to endorse SI and cries hysterically throughout the assessment. Pt is unable to contract for safety and agrees to VOL consent for treatment.   From admission H&P: Patient is [age] she reports no prior psychiatric treatment inpatient or outpatient however she has been abusing cannabis since age 33 and after December 2018 she states she went all out abusing multiple compounds methamphetamines, cocaine, heroin, various opiate pills, alcohol and continued cannabis usage all in response to losing her father.  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  She states she is never had withdrawal symptoms or seizures when coming off drugs.  [name] was  admitted for suicidal ideation with polysubstance abuse. She was started on Prozac, Seroquel, Buspar, and Valtrex. She participated in group therapy on the unit. She responded well to treatment with no adverse effects reported. She requested admission to rehab. Appropriate referrals were made, and she has been accepted to [facility]. She has shown improved mood, affect, sleep and interaction. On day of discharge, she is future-oriented, expressing that she wants to stay away from drugs so that she can be a better mother for her children. She denies SI/HI/AVH and contracts for safety. She is discharging on  the medications listed below. She is provided with medication samples and prescriptions at discharge. Her mother is picking her up for discharge to [facility].   Physical Findings: AIMS: Facial and Oral Movements Muscles of Facial Expression: None, normal Lips and Perioral Area: None, normal Jaw: None, normal Tongue: None, normal,Extremity Movements Upper (arms, wrists, hands, fingers): None, normal Lower (legs, knees, ankles, toes): None, normal, Trunk Movements Neck, shoulders, hips: None, normal, Overall Severity Severity of abnormal movements (highest score from questions above): None, normal Incapacitation due to abnormal movements: None, normal Patient's awareness of abnormal movements (rate only patient's report): No Awareness, Dental Status Current problems with teeth and/or dentures?: No Does patient usually wear dentures?: No  CIWA:    COWS:     Musculoskeletal: Strength & Muscle Tone: within normal limits Gait & Station: normal Patient leans: N/A  Psychiatric Specialty Exam: Physical Exam  Nursing note and vitals reviewed. Constitutional: She is oriented to person, place, and time. She appears well-developed and well-nourished.  Cardiovascular: Normal rate.  Respiratory: Effort normal.  Neurological: She is alert and oriented to person, place, and time.    Review of Systems   Constitutional: Negative.   Psychiatric/Behavioral: Positive for depression (stable on medication) and substance abuse. Negative for hallucinations and suicidal ideas. The patient is not nervous/anxious and does not have insomnia.     Blood pressure 124/81, pulse 71, temperature 97.9 F (36.6 C), temperature source Oral, resp. rate 16, height 4\' 11"  (1.499 m), weight 44.5 kg, last menstrual period 01/30/2019, SpO2 100 %.Body mass index is 19.79 kg/m.  See MD's discharge SRA     Have you used any form of tobacco in the last 30 days? (Cigarettes, Smokeless Tobacco, Cigars, and/or Pipes): Yes  Has this patient used any form of tobacco in the last 30 days? (Cigarettes, Smokeless Tobacco, Cigars, and/or Pipes)  Yes, A prescription for an FDA-approved tobacco cessation medication was offered at discharge and the patient refused  Blood Alcohol level:  Lab Results  Component Value Date   ETH <10 03/01/2019    Metabolic Disorder Labs:  No results found for: HGBA1C, MPG No results found for: PROLACTIN No results found for: CHOL, TRIG, HDL, CHOLHDL, VLDL, LDLCALC  See Psychiatric Specialty Exam and Suicide Risk Assessment completed by Attending Physician prior to discharge.  Discharge destination:  Home  Is patient on multiple antipsychotic therapies at discharge:  No   Has Patient had three or more failed trials of antipsychotic monotherapy by history:  No  Recommended Plan for Multiple Antipsychotic Therapies: NA   Allergies as of 03/04/2019      Reactions   Latex Other (See Comments)   Yeast infection   Baby Oil Rash   Morphine And Related Rash      Medication List    STOP taking these medications   ibuprofen 200 MG tablet Commonly known as: ADVIL     TAKE these medications     Indication  busPIRone 15 MG tablet Commonly known as: BUSPAR Take 1 tablet (15 mg total) by mouth 3 (three) times daily.  Indication: Major Depressive Disorder   Claritin-D 12 Hour 5-120 MG  tablet Generic drug: loratadine-pseudoephedrine Take 1 tablet by mouth 2 (two) times daily. For congestion and cough.  Indication: Stuffy Nose, Runny Nose   FLUoxetine 20 MG capsule Commonly known as: PROZAC Take 1 capsule (20 mg total) by mouth daily.  Indication: Depression   QUEtiapine 50 MG tablet Commonly known as: SEROQUEL Take 3 tablets (150 mg total) by  mouth at bedtime.  Indication: Depressive Phase of Manic-Depression   valACYclovir 1000 MG tablet Commonly known as: VALTREX Take 1 tablet (1,000 mg total) by mouth daily.  Indication: Herpes Simplex Infection      Follow-up Information    [facility] Follow up on 03/04/2019.   Why: You have been accepted for treatment on Thursday, 7/16 at 11:00a.  Please bring photo ID, 30-day supply of medications, 2 weeks of clothing and money for vending machine.  Contact information: [address] [place] [address] [phone]           Follow-up recommendations: Activity as tolerated. Diet as recommended by primary care physician. Keep all scheduled follow-up appointments as recommended.   Comments:   Patient is instructed to take all prescribed medications as recommended. Report any side effects or adverse reactions to your outpatient psychiatrist. Patient is instructed to abstain from alcohol and illegal drugs while on prescription medications. In the event of worsening symptoms, patient is instructed to call the crisis hotline, 911, or go to the nearest emergency department for evaluation and treatment.  Signed: Connye Burkitt, NP 03/04/2019, 9:27 AM

## 2019-03-04 NOTE — Plan of Care (Signed)
  Problem: Education: Goal: Knowledge of [facility] Education information/materials will improve Outcome: Adequate for Discharge   Problem: Education: Goal: Emotional status will improve Outcome: Adequate for Discharge   Problem: Education: Goal: Mental status will improve Outcome: Adequate for Discharge   Problem: Education: Goal: Verbalization of understanding the information provided will improve Outcome: Adequate for Discharge   Problem: Activity: Goal: Interest or engagement in activities will improve Outcome: Adequate for Discharge   Problem: Activity: Goal: Sleeping patterns will improve Outcome: Adequate for Discharge

## 2019-03-04 NOTE — Progress Notes (Signed)
Discharge note: Patient reviewed discharge paperwork with RN including prescriptions, follow up appointments, and lab work. Patient given the opportunity to ask questions. All concerns were addressed. All belongings were returned to patient. Denied SI/HI/AVH. Patient thanked staff for their care while at the hospital.  Patient was picked up in the lobby by her mother and is going to [facility].

## 2019-03-04 NOTE — Progress Notes (Signed)
  [facility] Adult Case Management Discharge Plan :  Will you be returning to the same living situation after discharge:  No.; Pt is going to [facility] for residential treatment At discharge, do you have transportation home?: Yes,  pt's mother Do you have the ability to pay for your medications: No.; treatment facility   Release of information consent forms completed and in the chart;  Patient's signature needed at discharge.  Patient to Follow up at: [place] Follow up on 03/04/2019.   Why: You have been accepted for treatment on Thursday, 7/16 at 11:00a.  Please bring photo ID, 30-day supply of medications, 2 weeks of clothing and money for vending machine.  Contact information: [place] [address] [phone]           Next level of care provider has access to [place] and Suicide Prevention discussed: Yes,  pt's husband  Have you used any form of tobacco in the last 30 days? (Cigarettes, Smokeless Tobacco, Cigars, and/or Pipes): Yes  Has patient been referred to the Quitline?: Patient refused referral  Patient has been referred for addiction treatment: Yes  Trecia Rogers, LCSW 03/04/2019, 9:44 AM

## 2019-03-04 NOTE — BHH Suicide Risk Assessment (Signed)
[facility] Discharge Suicide Risk Assessment   Principal Problem: Severity of depressive symptoms Discharge Diagnoses: Active Problems:   MDD (major depressive disorder), recurrent, severe, with psychosis ([place])   Total Time spent with patient: 45 minutes  Musculoskeletal: Strength & Muscle Tone: within normal limits Gait & Station: normal Patient leans: N/A  Psychiatric Specialty Exam: ROS  Blood pressure 124/81, pulse 71, temperature 97.9 F (36.6 C), temperature source Oral, resp. rate 16, height 4\' 11"  (1.499 m), weight 44.5 kg, last menstrual period 01/30/2019, SpO2 100 %.Body mass index is 19.79 kg/m.  General Appearance: Casual  Eye Contact::  Good  Speech:  Clear and [id number]  Volume:  Normal  Mood:  Euthymic  Affect:  Full Range  Thought Process:  Coherent and Goal Directed  Orientation:  Full (Time, Place, and Person)  Thought Content:  Tangential  Suicidal Thoughts:  No  Homicidal Thoughts:  No  Memory:  Immediate;   Fair  Judgement:  Intact  Insight:  Fair  Psychomotor Activity:  Normal  Concentration:  Good  Recall:  Good  Fund of Knowledge:Good  Language: Good  Akathisia:  Negative  Handed:  Right  AIMS (if indicated):     Assets:  Communication Skills Desire for Improvement  Sleep:  Number of Hours: 6.75  Cognition: WNL  ADL's:  Intact   Mental Status Per Nursing Assessment::   On Admission:  Suicidal ideation indicated by patient, Suicidal ideation indicated by others, Self-harm thoughts, Suicide plan  Demographic Factors:  Caucasian  Loss Factors: Loss of significant relationship  Historical Factors: Impulsivity  Risk Reduction Factors:   Religious beliefs about death  Continued Clinical Symptoms:  Alcohol/Substance Abuse/Dependencies  Cognitive Features That Contribute To Risk:  Polarized thinking    Suicide Risk:  Minimal: No identifiable suicidal ideation.  Patients presenting with no risk factors but with morbid ruminations; may be  classified as minimal risk based on the severity of the depressive symptoms  [facility] Follow up on 03/04/2019.   Why: You have been accepted for treatment on Thursday, 7/16 at 11:00a.  Please bring photo ID, 30-day supply of medications, 2 weeks of clothing and money for vending machine.  Contact information: [place] [address] [phone]           Plan Of Care/Follow-up recommendations:  Activity:  full  Jordani Nunn, MD 03/04/2019, 7:59 AM

## 2019-04-09 ENCOUNTER — Other Ambulatory Visit: Payer: Self-pay

## 2019-04-09 ENCOUNTER — Emergency Department (HOSPITAL_COMMUNITY)
Admission: EM | Admit: 2019-04-09 | Discharge: 2019-04-09 | Disposition: A | Discharge status: Home / Self Care | Payer: Self-pay

## 2019-04-10 ENCOUNTER — Other Ambulatory Visit: Payer: Self-pay

## 2019-04-10 ENCOUNTER — Emergency Department (HOSPITAL_COMMUNITY): Payer: Self-pay

## 2019-04-10 ENCOUNTER — Encounter (HOSPITAL_COMMUNITY): Payer: Self-pay | Admitting: Emergency Medicine

## 2019-04-10 ENCOUNTER — Emergency Department (HOSPITAL_COMMUNITY)
Admission: EM | Admit: 2019-04-10 | Discharge: 2019-04-10 | Disposition: A | Discharge status: Home / Self Care | Payer: Self-pay | Attending: Emergency Medicine | Admitting: Emergency Medicine

## 2019-04-10 DIAGNOSIS — Y939 Activity, unspecified: Secondary | ICD-10-CM | POA: Insufficient documentation

## 2019-04-10 DIAGNOSIS — F17210 Nicotine dependence, cigarettes, uncomplicated: Secondary | ICD-10-CM | POA: Insufficient documentation

## 2019-04-10 DIAGNOSIS — Z91040 Latex allergy status: Secondary | ICD-10-CM | POA: Insufficient documentation

## 2019-04-10 DIAGNOSIS — T148XXA Other injury of unspecified body region, initial encounter: Secondary | ICD-10-CM

## 2019-04-10 DIAGNOSIS — M79651 Pain in right thigh: Secondary | ICD-10-CM | POA: Insufficient documentation

## 2019-04-10 DIAGNOSIS — Y929 Unspecified place or not applicable: Secondary | ICD-10-CM | POA: Insufficient documentation

## 2019-04-10 DIAGNOSIS — S76911A Strain of unspecified muscles, fascia and tendons at thigh level, right thigh, initial encounter: Secondary | ICD-10-CM | POA: Insufficient documentation

## 2019-04-10 DIAGNOSIS — X58XXXA Exposure to other specified factors, initial encounter: Secondary | ICD-10-CM | POA: Insufficient documentation

## 2019-04-10 DIAGNOSIS — Y999 Unspecified external cause status: Secondary | ICD-10-CM | POA: Insufficient documentation

## 2019-04-10 DIAGNOSIS — M79604 Pain in right leg: Secondary | ICD-10-CM

## 2019-04-10 DIAGNOSIS — Z79899 Other long term (current) drug therapy: Secondary | ICD-10-CM | POA: Insufficient documentation

## 2019-04-10 HISTORY — DX: Herpesviral infection of urogenital system, unspecified: A60.00

## 2019-04-10 NOTE — ED Provider Notes (Signed)
[name] EMERGENCY DEPARTMENT Provider Note   CSN: [id number] Arrival date & time: 04/10/19  [id number]     History   Chief Complaint Chief Complaint  Patient presents with  . Leg Pain    HPI Sophia Garcia is a 33 y.o. female.     HPI   Sophia Garcia is a 33 y.o. female who presents to the Emergency Department complaining of sudden onset of pain and swelling to her anterior right thigh.  Symptoms began at 6 PM last evening.  She noticed progressive swelling and pain associated with weightbearing.  She denies known injury, but states that she has been carrying heavy loads of laundry for the last 2 days.  Pain does not radiate to her lower leg.  She denies rash, redness, or excessive warmth.  No numbness of the extremity.  No back pain.  Pain to her leg improves with elevation.  She also denies chest pain or shortness of breath.  No history of previous DVT   Past Medical History:  Diagnosis Date  . BV (bacterial vaginosis)   . Genital herpes   . HPV (human papilloma virus) infection   . Normal pregnancy, incidental 12/23/2011  . Preterm labor   . Yeast infection     Patient Active Problem List   Diagnosis Date Noted  . MDD (major depressive disorder), recurrent, severe, with psychosis ([facility]) 03/01/2019  . Erroneous encounter - disregard 08/03/2012  . Anemia 06/14/2012  . Status post repeat low transverse cesarean section/BTL 06/13/2012  . Tobacco abuse 12/23/2011  . Substance abuse complicating pregnancy, antepartum 12/23/2011    Past Surgical History:  Procedure Laterality Date  . CESAREAN SECTION    . CESAREAN SECTION  06/12/2012   Procedure: CESAREAN SECTION;  Surgeon: Kirkland HunArthur Stringer, MD;  Location: [facility];  Service: Obstetrics;  Laterality: N/A;  Repeat  . CHOLECYSTECTOMY N/A 01/03/2014   Procedure: LAPAROSCOPIC CHOLECYSTECTOMY;  Surgeon: Dalia HeadingMark A Jenkins, MD;  Location: [facility];  Service: General;  Laterality: N/A;  . FOOT SURGERY  2002   STEPPED ON SEWING NEEDLE   . TOOTH EXTRACTION  2009   ONE TOOTH  . TUBAL LIGATION       OB History    Gravida  2   Para  2   Term  2   Preterm      AB      Living  2     SAB      TAB      Ectopic      Multiple      Live Births  2            Home Medications    Prior to Admission medications   Medication Sig Start Date End Date Taking? Authorizing Provider  busPIRone (BUSPAR) 15 MG tablet Take 1 tablet (15 mg total) by mouth 3 (three) times daily. 03/04/19   Malvin JohnsFarah, Brian, MD  FLUoxetine (PROZAC) 20 MG capsule Take 1 capsule (20 mg total) by mouth daily. 03/04/19   Malvin JohnsFarah, Brian, MD  loratadine-pseudoephedrine (CLARITIN-D 12 HOUR) 5-120 MG tablet Take 1 tablet by mouth 2 (two) times daily. For congestion and cough. Patient not taking: Reported on 03/01/2019 02/03/19   Mesner, Barbara CowerJason, MD  QUEtiapine (SEROQUEL) 50 MG tablet Take 3 tablets (150 mg total) by mouth at bedtime. 03/04/19   Malvin JohnsFarah, Brian, MD  valACYclovir (VALTREX) 1000 MG tablet Take 1 tablet (1,000 mg total) by mouth daily. 03/04/19   Malvin JohnsFarah, Brian, MD  omeprazole (PRILOSEC) 20 MG  capsule Take 1 po BID x 2 weeks then once a day 06/23/18 02/03/19  Devoria AlbeKnapp, Iva, MD    Family History Family History  Problem Relation Age of Onset  . Diabetes Father        ORAL MEDS  . Cancer Paternal Aunt        BREAST;LIVER  . Diabetes Paternal Aunt   . Diabetes Paternal Uncle   . COPD Paternal Uncle   . Cancer Maternal Grandmother        BREAST  . COPD Mother        EMPHYSEMA  . Diabetes Paternal Grandfather   . Other Neg Hx     Social History Social History   Tobacco Use  . Smoking status: Current Every Day Smoker    Packs/day: 0.50    Years: 12.00    Pack years: 6.00    Types: Cigarettes  . Smokeless tobacco: Never Used  . Tobacco comment: 1 pack lasts 4 days  Substance Use Topics  . Alcohol use: Yes    Comment: occasionally  . Drug use: Yes    Types: Marijuana    Comment: occasionally     Allergies   Latex, Baby oil, and  Morphine and related   Review of Systems Review of Systems  Constitutional: Negative for chills and fever.  Respiratory: Negative for chest tightness and shortness of breath.   Cardiovascular: Negative for chest pain.  Gastrointestinal: Negative for nausea and vomiting.  Musculoskeletal: Positive for myalgias (Right leg pain and swelling). Negative for back pain and joint swelling.  Skin: Negative for color change and wound.  Neurological: Negative for dizziness, weakness and numbness.     Physical Exam Updated Vital Signs BP 124/83   Pulse 79   Temp 97.8 F (36.6 C) (Oral)   Resp 12   Ht 5' (1.524 m)   Wt 44.5 kg   LMP 04/05/2019   SpO2 98%   BMI 19.16 kg/m   Physical Exam Vitals signs and nursing note reviewed.  Constitutional:      Appearance: Normal appearance. She is not ill-appearing or toxic-appearing.  HENT:     Head: Atraumatic.  Cardiovascular:     Rate and Rhythm: Normal rate and regular rhythm.     Pulses: Normal pulses.  Pulmonary:     Effort: Pulmonary effort is normal. No respiratory distress.     Breath sounds: No wheezing.  Chest:     Chest wall: No tenderness.  Musculoskeletal:        General: Tenderness present.     Comments: Focal tenderness to palpation of the anterior right thigh.  Slight edema noted.  No tenderness of the posterior thigh or lower leg.  Negative Homans sign.  No erythema or excessive warmth  Skin:    General: Skin is warm.     Capillary Refill: Capillary refill takes less than 2 seconds.  Neurological:     General: No focal deficit present.     Mental Status: She is alert.     Sensory: No sensory deficit.     Motor: No weakness.  Psychiatric:        Mood and Affect: Mood normal.      ED Treatments / Results  Labs (all labs ordered are listed, but only abnormal results are displayed) Labs Reviewed - No data to display  EKG None  Radiology [place] Venous Img Lower Unilateral Right  Result Date: 04/10/2019 CLINICAL  DATA:  Right leg pain and swelling EXAM: RIGHT LOWER EXTREMITY VENOUS  DOPPLER ULTRASOUND TECHNIQUE: Gray-scale sonography with graded compression, as well as color Doppler and duplex ultrasound were performed to evaluate the lower extremity deep venous systems from the level of the common femoral vein and including the common femoral, femoral, profunda femoral, popliteal and calf veins including the posterior tibial, peroneal and gastrocnemius veins when visible. The superficial great saphenous vein was also interrogated. Spectral Doppler was utilized to evaluate flow at rest and with distal augmentation maneuvers in the common femoral, femoral and popliteal veins. COMPARISON:  None. FINDINGS: Contralateral Common Femoral Vein: Respiratory phasicity is normal and symmetric with the symptomatic side. No evidence of thrombus. Normal compressibility. Common Femoral Vein: No evidence of thrombus. Normal compressibility, respiratory phasicity and response to augmentation. Saphenofemoral Junction: No evidence of thrombus. Normal compressibility and flow on color Doppler imaging. Profunda Femoral Vein: No evidence of thrombus. Normal compressibility and flow on color Doppler imaging. Femoral Vein: No evidence of thrombus. Normal compressibility, respiratory phasicity and response to augmentation. Popliteal Vein: No evidence of thrombus. Normal compressibility, respiratory phasicity and response to augmentation. Calf Veins: No evidence of thrombus. Normal compressibility and flow on color Doppler imaging. Superficial Great Saphenous Vein: No evidence of thrombus. Normal compressibility. Venous Reflux:  Not assessed Other Findings:  None. IMPRESSION: No evidence of deep venous thrombosis. Electronically Signed   By: Jerilynn Mages.  Shick M.D.   On: 04/10/2019 11:24    Procedures Procedures (including critical care time)  Medications Ordered in ED Medications - No data to display   Initial Impression / Assessment and Plan / ED  Course  I have reviewed the triage vital signs and the nursing notes.  Pertinent labs & imaging results that were available during my care of the patient were reviewed by me and considered in my medical decision making (see chart for details).        Pt with focal tenderness of the anterior thigh.  Venous US of the extremity is neg for DVT.  NV intact.  Sx's likely musculoskeletal.  Patient reassured.  She agrees to elevate, ice, and NSAID therapy.  Strict return precautions were also discussed.  Final Clinical Impressions(s) / ED Diagnoses   Final diagnoses:  Right leg pain  Muscle strain    ED Discharge Orders    None       Kem Parkinson, Hershal Coria 04/10/19 1243    Milton Ferguson, MD 04/12/19 1630

## 2019-04-10 NOTE — ED Triage Notes (Signed)
Pt reports right leg pain and swelling since 6pm last night with no injury.

## 2019-04-10 NOTE — Discharge Instructions (Addendum)
Elevate your leg when possible.  Take Ibuprofen 800 mg 3 times a day with food.  Apply ice packs on and off for the next 2 to 3 days.  15 to 20 minutes at a time.  Do not apply the ice pack directly to the skin.  You may wear the Ace wrap as needed when standing or walking, do not wear continuously or at bedtime.  Follow-up with your primary doctor or return here for any worsening symptoms.

## 2019-10-27 ENCOUNTER — Ambulatory Visit (HOSPITAL_COMMUNITY)
Admission: RE | Admit: 2019-10-27 | Discharge: 2019-10-27 | Disposition: A | Discharge status: Home / Self Care | Payer: Federal, State, Local not specified - Other | Attending: Psychiatry | Admitting: Psychiatry

## 2019-10-27 DIAGNOSIS — F333 Major depressive disorder, recurrent, severe with psychotic symptoms: Secondary | ICD-10-CM | POA: Insufficient documentation

## 2019-10-27 DIAGNOSIS — F1990 Other psychoactive substance use, unspecified, uncomplicated: Secondary | ICD-10-CM | POA: Insufficient documentation

## 2019-10-28 ENCOUNTER — Inpatient Hospital Stay (HOSPITAL_COMMUNITY)
Admission: AD | Admit: 2019-10-28 | Discharge: 2019-10-31 | DRG: 885 | Disposition: A | Discharge status: Home / Self Care | Payer: Federal, State, Local not specified - Other | Source: Intra-hospital | Attending: Psychiatry | Admitting: Psychiatry

## 2019-10-28 ENCOUNTER — Other Ambulatory Visit: Payer: Self-pay | Admitting: Behavioral Health

## 2019-10-28 ENCOUNTER — Encounter (HOSPITAL_COMMUNITY): Payer: Self-pay | Admitting: Behavioral Health

## 2019-10-28 ENCOUNTER — Ambulatory Visit (HOSPITAL_COMMUNITY)
Admission: RE | Admit: 2019-10-28 | Discharge: 2019-10-28 | Disposition: A | Discharge status: Home / Self Care | Payer: Federal, State, Local not specified - Other | Attending: Psychiatry | Admitting: Psychiatry

## 2019-10-28 ENCOUNTER — Other Ambulatory Visit: Payer: Self-pay

## 2019-10-28 DIAGNOSIS — F339 Major depressive disorder, recurrent, unspecified: Secondary | ICD-10-CM | POA: Diagnosis present

## 2019-10-28 DIAGNOSIS — Z20822 Contact with and (suspected) exposure to covid-19: Secondary | ICD-10-CM | POA: Diagnosis present

## 2019-10-28 DIAGNOSIS — Z818 Family history of other mental and behavioral disorders: Secondary | ICD-10-CM

## 2019-10-28 DIAGNOSIS — F419 Anxiety disorder, unspecified: Secondary | ICD-10-CM | POA: Diagnosis present

## 2019-10-28 DIAGNOSIS — K219 Gastro-esophageal reflux disease without esophagitis: Secondary | ICD-10-CM | POA: Diagnosis present

## 2019-10-28 DIAGNOSIS — Z825 Family history of asthma and other chronic lower respiratory diseases: Secondary | ICD-10-CM

## 2019-10-28 DIAGNOSIS — F329 Major depressive disorder, single episode, unspecified: Secondary | ICD-10-CM | POA: Diagnosis present

## 2019-10-28 DIAGNOSIS — Z79899 Other long term (current) drug therapy: Secondary | ICD-10-CM

## 2019-10-28 DIAGNOSIS — F32A Depression, unspecified: Secondary | ICD-10-CM | POA: Diagnosis present

## 2019-10-28 DIAGNOSIS — G4700 Insomnia, unspecified: Secondary | ICD-10-CM | POA: Diagnosis present

## 2019-10-28 DIAGNOSIS — F17210 Nicotine dependence, cigarettes, uncomplicated: Secondary | ICD-10-CM | POA: Diagnosis present

## 2019-10-28 DIAGNOSIS — R45851 Suicidal ideations: Secondary | ICD-10-CM | POA: Diagnosis present

## 2019-10-28 DIAGNOSIS — Z833 Family history of diabetes mellitus: Secondary | ICD-10-CM | POA: Diagnosis not present

## 2019-10-28 DIAGNOSIS — F332 Major depressive disorder, recurrent severe without psychotic features: Secondary | ICD-10-CM | POA: Diagnosis not present

## 2019-10-28 LAB — CBC WITH DIFFERENTIAL/PLATELET
Abs Immature Granulocytes: 0.02 10*3/uL (ref 0.00–0.07)
Basophils Absolute: 0 10*3/uL (ref 0.0–0.1)
Basophils Relative: 0 %
Eosinophils Absolute: 0.2 10*3/uL (ref 0.0–0.5)
Eosinophils Relative: 2 %
HCT: 38.7 % (ref 36.0–46.0)
Hemoglobin: 12.5 g/dL (ref 12.0–15.0)
Immature Granulocytes: 0 %
Lymphocytes Relative: 24 %
Lymphs Abs: 1.9 10*3/uL (ref 0.7–4.0)
MCH: 29.6 pg (ref 26.0–34.0)
MCHC: 32.3 g/dL (ref 30.0–36.0)
MCV: 91.7 fL (ref 80.0–100.0)
Monocytes Absolute: 0.5 10*3/uL (ref 0.1–1.0)
Monocytes Relative: 6 %
Neutro Abs: 5.3 10*3/uL (ref 1.7–7.7)
Neutrophils Relative %: 68 %
Platelets: 546 10*3/uL — ABNORMAL HIGH (ref 150–400)
RBC: 4.22 MIL/uL (ref 3.87–5.11)
RDW: 14.6 % (ref 11.5–15.5)
WBC: 7.8 10*3/uL (ref 4.0–10.5)
nRBC: 0 % (ref 0.0–0.2)

## 2019-10-28 LAB — COMPREHENSIVE METABOLIC PANEL
ALT: 15 U/L (ref 0–44)
AST: 18 U/L (ref 15–41)
Albumin: 4.3 g/dL (ref 3.5–5.0)
Alkaline Phosphatase: 24 U/L — ABNORMAL LOW (ref 38–126)
Anion gap: 8 (ref 5–15)
BUN: 15 mg/dL (ref 6–20)
CO2: 25 mmol/L (ref 22–32)
Calcium: 9.4 mg/dL (ref 8.9–10.3)
Chloride: 107 mmol/L (ref 98–111)
Creatinine, Ser: 0.56 mg/dL (ref 0.44–1.00)
GFR calc Af Amer: >60 mL/min (ref >60)
GFR calc non Af Amer: >60 mL/min (ref >60)
Glucose, Bld: 127 mg/dL — ABNORMAL HIGH (ref 70–99)
Potassium: 3.5 mmol/L (ref 3.5–5.1)
Sodium: 140 mmol/L (ref 135–145)
Total Bilirubin: 1 mg/dL (ref 0.3–1.2)
Total Protein: 7.3 g/dL (ref 6.5–8.1)

## 2019-10-28 LAB — RESPIRATORY PANEL BY RT PCR (FLU A&B, COVID)
Influenza A by PCR: NEGATIVE
Influenza B by PCR: NEGATIVE
SARS Coronavirus 2 by RT PCR: NEGATIVE

## 2019-10-28 LAB — ETHANOL: Alcohol, Ethyl (B): <10 mg/dL (ref <10)

## 2019-10-28 MED ORDER — HYDROXYZINE HCL 25 MG PO TABS
25.0000 mg | ORAL_TABLET | Freq: Four times a day (QID) | ORAL | Status: DC | PRN
  Administered 2019-10-28 – 2019-10-29 (×2): 25 mg via ORAL
  Filled 2019-10-28: qty 1
  Filled 2019-10-28: qty 10
  Filled 2019-10-28: qty 1

## 2019-10-28 MED ORDER — VALACYCLOVIR HCL 500 MG PO TABS
1000.0000 mg | ORAL_TABLET | Freq: Every day | ORAL | Status: DC
  Administered 2019-10-29 – 2019-10-31 (×3): 1000 mg via ORAL
  Filled 2019-10-28 (×2): qty 2
  Filled 2019-10-28: qty 14
  Filled 2019-10-28 (×4): qty 2

## 2019-10-28 MED ORDER — FOLIC ACID 1 MG PO TABS
1.0000 mg | ORAL_TABLET | Freq: Every day | ORAL | Status: DC
  Administered 2019-10-28 – 2019-10-31 (×4): 1 mg via ORAL
  Filled 2019-10-28 (×6): qty 1

## 2019-10-28 MED ORDER — BUSPIRONE HCL 10 MG PO TABS
10.0000 mg | ORAL_TABLET | Freq: Three times a day (TID) | ORAL | Status: DC
  Administered 2019-10-28 – 2019-10-31 (×9): 10 mg via ORAL
  Filled 2019-10-28 (×2): qty 1
  Filled 2019-10-28: qty 21
  Filled 2019-10-28: qty 2
  Filled 2019-10-28: qty 21
  Filled 2019-10-28: qty 1
  Filled 2019-10-28: qty 21
  Filled 2019-10-28 (×7): qty 1

## 2019-10-28 MED ORDER — FLUOXETINE HCL 20 MG PO CAPS
20.0000 mg | ORAL_CAPSULE | Freq: Every day | ORAL | Status: DC
  Administered 2019-10-28: 20 mg via ORAL
  Filled 2019-10-28 (×3): qty 1

## 2019-10-28 MED ORDER — TRAZODONE HCL 50 MG PO TABS
50.0000 mg | ORAL_TABLET | Freq: Every evening | ORAL | Status: DC | PRN
  Administered 2019-10-28 – 2019-10-30 (×3): 50 mg via ORAL
  Filled 2019-10-28 (×2): qty 1
  Filled 2019-10-28: qty 7
  Filled 2019-10-28: qty 1

## 2019-10-28 MED ORDER — THIAMINE HCL 100 MG PO TABS
100.0000 mg | ORAL_TABLET | Freq: Every day | ORAL | Status: DC
  Administered 2019-10-28 – 2019-10-31 (×4): 100 mg via ORAL
  Filled 2019-10-28 (×6): qty 1

## 2019-10-28 MED ORDER — LORAZEPAM 1 MG PO TABS
1.0000 mg | ORAL_TABLET | Freq: Four times a day (QID) | ORAL | Status: DC | PRN
  Administered 2019-10-28 – 2019-10-30 (×3): 1 mg via ORAL
  Filled 2019-10-28: qty 2
  Filled 2019-10-28 (×2): qty 1
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
## 2019-10-28 NOTE — Progress Notes (Signed)
Patient ID: Sophia Garcia, female   DOB: 1985/12/14, 34 y.o.   MRN: [id number] Pt A&O x 4, no distress noted, calm & cooperative, sad affect.  Contracts for safety.  Monitoring for safety.  Pending transfer to [address].
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
## 2019-10-28 NOTE — H&P (Signed)
Behavioral Health Medical Screening Exam  Sophia Garcia is an 34 y.o. female.who presented to [facility] today with chief complaint of suicidal thoughts. Per chart review, patient presented to [facility] last night as a walk-inn with similar complaints however, she stated that she left after," I felt traumatized because I was here before and it just brought back the memories."  She states that last night, she was in the kitchen sharpening her 9 inch knife to with the intent to hurt herself although her husband walked in and intervened. She did not mention any relationship issues with husband during this assessment although yesterday she identified her trigger as her toxic living situation with her ex husband which is triggering her daily alcohol drinking. She reports a long history of alcohol use with only a two month as longest sobriety. Reports her last alcohol consumption was at 9:00 pm last night reporting drinking 7-8 beers. She reports a history of multiple drug use to include; mariajuana, various opiate pills, cocaine, xanax and meth. Reports she has been clean from those drugs for 7 months. She reports a history of SA stating August of last year she made an attempt with a knife. She adds that last year, she had SI with a plan to shoot herself with her husbands gun and she states that the gun is still in the home. She denies AVH or homicidal thoughts. Reports current medications as Prozac, Buspar and Hydroxyzine and reports she is complaint with her medications although there was period when she couldn't afford them so she was off the medications for about a month. She reports she has been on the medications since July of last year and she believes that they are no longer helpful. Reports current psychiatrists as Mauricia Area which she sees virtually every 12 weeks. Reports family history of mental health illness as; paternal side depression, substance abuse and an Aunt who attempted suicide on multiple occasions.  Reports a number of depressive symptoms (giult, worthlessness, tearfulness, wanting to be alone, and suicidal thoughts.   During evaluation; she is alert/oriented x 4, mood is depressed and affect is congruent. She is tearful at times.  There is no indication that she is currently intoxicated.There is no indication that she is currently responding to internal/external stimuli or experiencing delusional thought content. With further assessment, she is unable to contract for safety and states she is hoping for psychiatric hospitalization.  Total Time spent with patient: 20 minutes  Psychiatric Specialty Exam: Physical Exam  Vitals reviewed. Constitutional: She is oriented to person, place, and time.  Neurological: She is alert and oriented to person, place, and time.    Review of Systems  Psychiatric/Behavioral: Positive for suicidal ideas.       Depression     There were no vitals taken for this visit.There is no height or weight on file to calculate BMI.  General Appearance: Fairly Groomed  Eye Contact:  Fair  Speech:  Clear and Coherent and Normal Rate  Volume:  Decreased  Mood:  Depressed  Affect:  Depressed and Tearful  Thought Process:  Coherent, Linear and Descriptions of Associations: Intact  Orientation:  Full (Time, Place, and Person)  Thought Content:  Logical  Suicidal Thoughts:  Yes. Had plans to harm herself last night with a kitchen knife until her husband intervened.   Homicidal Thoughts:  No  Memory:  Immediate;   Fair Recent;   Fair  Judgement:  Fair  Insight:  Fair  Psychomotor Activity:  Normal  Concentration:  Concentration: Fair and Attention Span: Fair  Recall:  [employer] of Knowledge:Fair  Language: Good  Akathisia:  Negative  Handed:  Right  AIMS (if indicated):     Assets:  Communication Skills Desire for Improvement Resilience  Sleep:       Musculoskeletal: Strength & Muscle Tone: within normal limits Gait & Station: normal Patient leans:  N/A  There were no vitals taken for this visit.  Recommendations:  Based on my evaluation the patient does not appear to have an emergency medical condition.   Recommending inpatient psychiatric hospitalization for stabilization and safety.     Mordecai Maes, NP 10/28/2019, 1:02 PM

## 2019-10-28 NOTE — Plan of Care (Signed)
[facility] Observation Crisis Plan  Reason for Crisis Plan:  Crisis Stabilization, Medication Management and Substance Abuse   Plan of Care:  Referral for Substance Abuse and Referral for Telepsychiatry/Psychiatric Consult  Family Support:      Current Living Environment:  Living Arrangements: Children, Spouse/significant other  Insurance:   Hospital Account    Name Acct ID Class Status Primary Coverage   Javier, Mamone [id number] BEHAVIORAL HEALTH OBSERVATION [facility] FOR MH/DD/SAS - 3-WAY [place]        Guarantor Account (for Hospital Account [id number])    Name Relation to Pt Service Area Active? Acct Type   Nicki Reaper Self [facility] Yes [facility]   Address Phone       [address] [place], [place] [address] [phone])          Coverage Information (for Hospital Account [id number])    F/O Payor/Plan Precert #   [facility] FOR MH/DD/SAS/3-WAY [facility]    Subscriber Subscriber #   Lorrine, Killilea [id number]   Address Phone   [address] [place], [place] [address] [phone]      Legal Guardian:  Legal Guardian: (self)  Primary Care Provider:  Patient, No Pcp Per  Current Outpatient Providers:  N/A  Psychiatrist:  Name of Psychiatrist: [facility]  Counselor/Therapist:  Name of Therapist: none  Compliant with Medications:  No  Additional Information:   Tania Ade 3/11/20217:58 PM

## 2019-10-28 NOTE — BH Assessment (Signed)
Assessment Note  Sophia Garcia is an 34 y.o. female presenting voluntarily to [facility] for assessment for assessment for SI and substance abuse. Patient accessed [facility] the previous evening with the same complaint and left AMA. Patient reports last night she was attempting to kill herself with a kitchen knife when her husband stopped her. She continues to endorse SI and cannot contract for safety. She denies HI/AVH. 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 Patient reports drinking 6-7 beers per day. She denies any other substance use. Patient cites separating from her husband and relapsing on alcohol as primary stressors. Patient denies any criminal charges or history of abuse.  Patient is alert and oriented x 4. She is dressed appropriately. Her speech is logical, eye contact is poor, and thoughts are organized. Her mood is depressed and affect is congruent. She has poor insight, judgement, and impulse control. She does not appear to be responding to internal stimuli or experiencing delusional thought content.   Diagnosis: F33.2 MDD, recurrent, severe   F10.20 Alcohol use disorder, severe  Past Medical History:  Past Medical History:  Diagnosis Date  . BV (bacterial vaginosis)   . Genital herpes   . HPV (human papilloma virus) infection   . Normal pregnancy, incidental 12/23/2011  . Preterm labor   . Yeast infection     Past Surgical History:  Procedure Laterality Date  . CESAREAN SECTION    . CESAREAN SECTION  06/12/2012   Procedure: CESAREAN SECTION;  Surgeon: Kirkland Hun, MD;  Location: [facility];  Service: Obstetrics;  Laterality: N/A;  Repeat  . CHOLECYSTECTOMY N/A 01/03/2014   Procedure: LAPAROSCOPIC CHOLECYSTECTOMY;  Surgeon: Dalia Heading, MD;  Location: [facility];  Service: General;  Laterality: N/A;  . FOOT SURGERY  2002   STEPPED ON SEWING NEEDLE  . TOOTH EXTRACTION  2009   ONE TOOTH  . TUBAL LIGATION      Family History:  Family  History  Problem Relation Age of Onset  . Diabetes Father        ORAL MEDS  . Cancer Paternal Aunt        BREAST;LIVER  . Diabetes Paternal Aunt   . Diabetes Paternal Uncle   . COPD Paternal Uncle   . Cancer Maternal Grandmother        BREAST  . COPD Mother        EMPHYSEMA  . Diabetes Paternal Grandfather   . Other Neg Hx     Social History:  reports that she has been smoking cigarettes. She has a 6.00 pack-year smoking history. She has never used smokeless tobacco. She reports current alcohol use. She reports current drug use. Drug: Marijuana.  Additional Social History:  Alcohol / Drug Use Pain Medications: see MAR Prescriptions: see MAR Over the Counter: see MAR History of alcohol / drug use?: Yes Substance #1 Name of Substance 1: alcohol 1 - Age of First Use: UTA 1 - Amount (size/oz): 6-7 1 - Frequency: daily 1 - Duration: 6 months 1 - Last Use / Amount: 3/10- 6 beers  CIWA:   COWS:    Allergies:  Allergies  Allergen Reactions  . Latex Other (See Comments)    Yeast infection  . Baby Oil Rash  . Morphine And Related Rash    Home Medications:  Medications Prior to Admission  Medication Sig Dispense Refill  . busPIRone (BUSPAR) 15 MG tablet Take 1 tablet (15 mg total) by mouth 3 (three) times daily. 90  tablet 2  . FLUoxetine (PROZAC) 20 MG capsule Take 1 capsule (20 mg total) by mouth daily. 90 capsule 1  . hydrOXYzine (ATARAX/VISTARIL) 50 MG tablet Take 50 mg by mouth 3 (three) times daily.    . QUEtiapine (SEROQUEL) 50 MG tablet Take 3 tablets (150 mg total) by mouth at bedtime. 90 tablet 1  . loratadine-pseudoephedrine (CLARITIN-D 12 HOUR) 5-120 MG tablet Take 1 tablet by mouth 2 (two) times daily. For congestion and cough. (Patient not taking: Reported on 03/01/2019) 20 tablet 0  . valACYclovir (VALTREX) 1000 MG tablet Take 1 tablet (1,000 mg total) by mouth daily. (Patient not taking: Reported on 10/28/2019) 90 tablet 1    OB/GYN Status:  No LMP  recorded.  General Assessment Data Location of Assessment: [facility] Assessment Services TTS Assessment: In system Is this a Tele or Face-to-Face Assessment?: Face-to-Face Is this an Initial Assessment or a Re-assessment for this encounter?: Initial Assessment Patient Accompanied by:: N/A Language Other than English: No Living Arrangements: Other (Comment)(private home) What gender do you identify as?: Female Marital status: Separated Living Arrangements: Children, Spouse/significant other Can pt return to current living arrangement?: Yes Admission Status: Voluntary Is patient capable of signing voluntary admission?: Yes Referral Source: Self/Family/Friend Insurance type: none     Crisis Care Plan Living Arrangements: Children, Spouse/significant other Legal Guardian: (self) Name of Psychiatrist: Gerrie Nordmann Name of Therapist: none  Education Status Is patient currently in school?: No Is the patient employed, unemployed or receiving disability?: Employed  Risk to self with the past 6 months Suicidal Ideation: Yes-Currently Present Has patient been a risk to self within the past 6 months prior to admission? : Yes Suicidal Intent: No-Not Currently/Within Last 6 Months Has patient had any suicidal intent within the past 6 months prior to admission? : Yes Is patient at risk for suicide?: Yes Suicidal Plan?: Yes-Currently Present Has patient had any suicidal plan within the past 6 months prior to admission? : Yes Specify Current Suicidal Plan: cutting Access to Means: Yes Specify Access to Suicidal Means: access to knife What has been your use of drugs/alcohol within the last 12 months?: beer Previous Attempts/Gestures: Yes How many times?: 1 Other Self Harm Risks: none Triggers for Past Attempts: Spouse contact Intentional Self Injurious Behavior: Cutting Comment - Self Injurious Behavior: hx of cutting Family Suicide History: No Recent stressful life event(s): Divorce,  Financial Problems Persecutory voices/beliefs?: No Depression: Yes Depression Symptoms: Despondent, Insomnia, Tearfulness, Isolating, Fatigue, Guilt, Loss of interest in usual pleasures, Feeling worthless/self pity, Feeling angry/irritable Substance abuse history and/or treatment for substance abuse?: No Suicide prevention information given to non-admitted patients: Not applicable  Risk to Others within the past 6 months Homicidal Ideation: No Does patient have any lifetime risk of violence toward others beyond the six months prior to admission? : No Thoughts of Harm to Others: No Current Homicidal Intent: No Current Homicidal Plan: No Access to Homicidal Means: No Identified Victim: none History of harm to others?: No Assessment of Violence: None Noted Violent Behavior Description: none Does patient have access to weapons?: No Criminal Charges Pending?: No Does patient have a court date: No Is patient on probation?: No  Psychosis Hallucinations: None noted Delusions: None noted  Mental Status Report Appearance/Hygiene: Unremarkable, Poor hygiene Eye Contact: Poor Motor Activity: Freedom of movement Speech: Logical/coherent Level of Consciousness: Alert Mood: Depressed Affect: Depressed Anxiety Level: Minimal Thought Processes: Coherent, Relevant Judgement: Impaired Orientation: Person, Place, Time, Situation Obsessive Compulsive Thoughts/Behaviors: None  Cognitive Functioning Concentration: Normal Memory:  Recent Intact, Remote Intact Is patient IDD: No Insight: Fair Impulse Control: Fair Appetite: Poor Have you had any weight changes? : Gain Amount of the weight change? (lbs): 40 lbs Sleep: Decreased Total Hours of Sleep: 6 Vegetative Symptoms: Decreased grooming  ADLScreening [facility] Assessment Services) Patient's cognitive ability adequate to safely complete daily activities?: Yes Patient able to express need for assistance with ADLs?: Yes Independently  performs ADLs?: Yes (appropriate for developmental age)  Prior Inpatient Therapy Prior Inpatient Therapy: Yes Prior Therapy Dates: 2020 Prior Therapy Facilty/Provider(s): [facility] Reason for Treatment: SI/ substance abuse  Prior Outpatient Therapy Prior Outpatient Therapy: Yes Prior Therapy Dates: ongoing Prior Therapy Facilty/Provider(s): [facility] Reason for Treatment: depression Does patient have an ACCT team?: No Does patient have Intensive In-House Services?  : No Does patient have [facility] services? : No Does patient have P4[facility] services?: No  ADL Screening (condition at time of admission) Patient's cognitive ability adequate to safely complete daily activities?: Yes Patient able to express need for assistance with ADLs?: Yes Independently performs ADLs?: Yes (appropriate for developmental age)       Abuse/Neglect Assessment (Assessment to be complete while patient is alone) Abuse/Neglect Assessment Can Be Completed: Yes Physical Abuse: Denies Verbal Abuse: Denies Sexual Abuse: Denies Exploitation of patient/patient's resources: Denies Self-Neglect: Denies Values / Beliefs Cultural Requests During Hospitalization: None Spiritual Requests During Hospitalization: None Consults Spiritual Care Consult Needed: No Transition of Care Team Consult Needed: No Advance Directives (For Healthcare) Does Patient Have a Medical Advance Directive?: No Would patient like information on creating a medical advance directive?: No - Patient declined          Disposition: Denzil Magnuson, NP recommends in patient treatment. Will review at [facility]. Disposition Initial Assessment Completed for this Encounter: Yes Disposition of Patient: Admit Type of inpatient treatment program: Adult Patient refused recommended treatment: No  On Site Evaluation by:   Reviewed with Physician:    Celedonio Miyamoto 10/28/2019 1:13 PM

## 2019-10-28 NOTE — Progress Notes (Signed)
Patient ID: Sophia Garcia, female   DOB: 01-May-1986, 34 y.o.   MRN: [id number] Pt A&O x 4, presents with SI, plan to cut self with knife, pts husband walked in and intervened.  Pt unable to contract for safety.  Denies HI or AVH.  Skin search completed.  Pt calm & cooperative at present.  Monitoring for safety, no distress noted.

## 2019-10-29 DIAGNOSIS — F32A Depression, unspecified: Secondary | ICD-10-CM | POA: Diagnosis present

## 2019-10-29 DIAGNOSIS — F329 Major depressive disorder, single episode, unspecified: Secondary | ICD-10-CM | POA: Diagnosis present

## 2019-10-29 LAB — HEMOGLOBIN A1C
Hgb A1c MFr Bld: 5 % (ref 4.8–5.6)
Mean Plasma Glucose: 96.8 mg/dL

## 2019-10-29 LAB — PREGNANCY, URINE: Preg Test, Ur: NEGATIVE

## 2019-10-29 LAB — RAPID URINE DRUG SCREEN, HOSP PERFORMED
Amphetamines: NOT DETECTED
Barbiturates: NOT DETECTED
Benzodiazepines: NOT DETECTED
Cocaine: NOT DETECTED
Opiates: NOT DETECTED
Tetrahydrocannabinol: NOT DETECTED

## 2019-10-29 LAB — TSH: TSH: 0.039 u[IU]/mL — ABNORMAL LOW (ref 0.350–4.500)

## 2019-10-29 MED ORDER — ASPIRIN 81 MG PO TBEC
81.0000 mg | DELAYED_RELEASE_TABLET | Freq: Every day | ORAL | Status: DC
  Administered 2019-10-29 – 2019-10-31 (×3): 81 mg via ORAL
  Filled 2019-10-29 (×2): qty 1
  Filled 2019-10-29: qty 7
  Filled 2019-10-29 (×2): qty 1

## 2019-10-29 MED ORDER — NICOTINE 14 MG/24HR TD PT24
14.0000 mg | MEDICATED_PATCH | Freq: Every day | TRANSDERMAL | Status: DC
  Administered 2019-10-29 – 2019-10-31 (×3): 14 mg via TRANSDERMAL
  Filled 2019-10-29 (×6): qty 1

## 2019-10-29 MED ORDER — PANTOPRAZOLE SODIUM 40 MG PO TBEC
40.0000 mg | DELAYED_RELEASE_TABLET | Freq: Every day | ORAL | Status: DC
  Administered 2019-10-29 – 2019-10-31 (×3): 40 mg via ORAL
  Filled 2019-10-29: qty 1
  Filled 2019-10-29: qty 7
  Filled 2019-10-29 (×3): qty 1

## 2019-10-29 MED ORDER — FLUOXETINE HCL 20 MG PO CAPS
40.0000 mg | ORAL_CAPSULE | Freq: Every day | ORAL | Status: DC
  Administered 2019-10-29 – 2019-10-31 (×3): 40 mg via ORAL
  Filled 2019-10-29 (×2): qty 2
  Filled 2019-10-29: qty 14
  Filled 2019-10-29 (×3): qty 2

## 2019-10-29 NOTE — [facility] Suicide Risk Assessment (Signed)
[facility] Admission Suicide Risk Assessment   Nursing information obtained from:  Patient, Review of record Demographic factors:  Caucasian Current Mental Status:  Suicidal ideation indicated by patient Loss Factors:  Financial problems / change in socioeconomic status Historical Factors:  Impulsivity Risk Reduction Factors:  Positive social support, Employed, Positive therapeutic relationship, Sense of responsibility to family, Living with another person, especially a relative, Positive coping skills or problem solving skills  Total Time spent with patient: 30 minutes Principal Problem: <principal problem not specified> Diagnosis:  Active Problems:   MDD (major depressive disorder)  Subjective Data: Patient is seen and examined.  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  The patient stated that the night prior to admission she was in the kitchen sharpening a 9 inch knife, and she was sharpening it to harm her self.  Her husband walked in and intervene.  She stated that she came in contact with her ex-husband, and that brought back memories of the toxic relationship.  She has a history of marijuana use disorder, opiate use disorder, cocaine use disorder, benzodiazepine use disorder and methamphetamines.  She drank 7-8 beers overnight.  On evaluation she admitted to suicidal ideation.  She stated that her last rehabilitation stay was in 2020, and she had been started on fluoxetine at that time.  During the course of her treatment it was maintained at 20 mg, but she felt as though it was not working had had self increased it to 40 mg.  She had only done that for 1 to 2 weeks last fall.  Her request today is to get her depression under better control.  She was admitted to the hospital for  evaluation and stabilization.  Continued Clinical Symptoms:  Alcohol Use Disorder Identification Test Final Score (AUDIT): 30 The "Alcohol Use Disorders Identification Test", Guidelines for Use in Primary Care, Second Edition.  World Science writer [facility]). Score between 0-7:  no or low risk or alcohol related problems. Score between 8-15:  moderate risk of alcohol related problems. Score between 16-19:  high risk of alcohol related problems. Score 20 or above:  warrants further diagnostic evaluation for alcohol dependence and treatment.   CLINICAL FACTORS:   Depression:   Anhedonia Comorbid alcohol abuse/dependence Hopelessness Impulsivity Insomnia Alcohol/Substance Abuse/Dependencies   Musculoskeletal: Strength & Muscle Tone: within normal limits Gait & Station: normal Patient leans: N/A  Psychiatric Specialty Exam: Physical Exam  Nursing note and vitals reviewed. Constitutional: She is oriented to person, place, and time. She appears well-developed and well-nourished.  HENT:  Head: Normocephalic and atraumatic.  Respiratory: Effort normal.  Neurological: She is alert and oriented to person, place, and time.    Review of Systems  Blood pressure 108/73, pulse 97, temperature 97.7 F (36.5 C), temperature source Oral, resp. rate 18, SpO2 98 %.There is no height or weight on file to calculate BMI.  General Appearance: Casual  Eye Contact:  Fair  Speech:  Normal Rate  Volume:  Decreased  Mood:  Anxious and Depressed  Affect:  Congruent  Thought Process:  Coherent and Descriptions of Associations: Circumstantial  Orientation:  Full (Time, Place, and Person)  Thought Content:  Logical  Suicidal Thoughts:  Yes.  without intent/plan  Homicidal Thoughts:  No  Memory:  Immediate;   Fair Recent;   Fair Remote;   Fair  Judgement:  Intact  Insight:  Fair  Psychomotor Activity:  Decreased  Concentration:  Concentration: Fair and Attention Span: Fair  Recall:  [employer]  of Knowledge:  Fair  Language:  Good  Akathisia:  Negative  Handed:  Right  AIMS (if indicated):     Assets:  Desire for Improvement Housing Resilience  ADL's:  Intact  Cognition:  WNL  Sleep:         COGNITIVE FEATURES THAT CONTRIBUTE TO RISK:  None    SUICIDE RISK:   Moderate:  Frequent suicidal ideation with limited intensity, and duration, some specificity in terms of plans, no associated intent, good self-control, limited dysphoria/symptomatology, some risk factors present, and identifiable protective factors, including available and accessible social support.  PLAN OF CARE: Patient is seen and examined.  Patient is a 34 year old female with the above-stated past psychiatric history was admitted with suicidal ideation as well as relapse on alcohol.  She will be admitted to the hospital.  She will be encouraged to attend groups.  She will be encouraged to work on her coping skills.  We discussed the possibility of switching antidepressant medicines or increasing the dose of the fluoxetine.  She stated she had had no side effects to fluoxetine.  We will increase that to 40 mg p.o. daily.  She will also be placed on lorazepam 1 mg p.o. every 6 hours as needed a CIWA greater than 10.  She will also be placed on folic acid as well as thiamine.  She had also been on BuSpar previously, and we will restart that at 10 mg p.o. 3 times daily.  She does have a history of herpes, and her valacyclovir will be continued at 1000 mg p.o. daily.  Her laboratories revealed a mildly elevated glucose at 127.  CBC was normal except for platelets at 546,000.  Beta-hCG is not available yet.  Blood alcohol was less than 10.  Drug screen is not back available yet.  Her vital signs are stable, she is afebrile.  Her CIWA this morning was 6.  Sleep was not recorded.  I certify that inpatient services furnished can reasonably be expected to improve the patient's condition.   Sharma Covert, MD 10/29/2019, 9:52  AM

## 2019-10-29 NOTE — Progress Notes (Signed)
Recreation Therapy Notes  Date:  3.12.21 Time: 0930 Location: [address] Dayroom  Group Topic: Stress Management  Goal Area(s) Addresses:  Patient will identify positive stress management techniques. Patient will identify benefits of using stress management post d/c.  Intervention: Stress Management  Activity :  Meditation.  LRT played a meditation that focused on being resilient in the face of adversity.  Patients were to listen as meditation played to engage in activity.  Education:  Stress Management, Discharge Planning.   Education Outcome: Acknowledges Education  Clinical Observations/Feedback: Pt did not attend group activity.    Caoilainn Sacks, LRT/CTRS         Willamae Demby A 10/29/2019 10:58 AM 

## 2019-10-29 NOTE — Tx Team (Signed)
Initial Treatment Plan 10/29/2019 7:09 AM [name] FXO:[id number]    PATIENT STRESSORS: Marital or family conflict Medication change or noncompliance   PATIENT STRENGTHS: General fund of knowledge Motivation for treatment/growth   PATIENT IDENTIFIED PROBLEMS:  risk SI  depression  "depression"                 DISCHARGE CRITERIA:  Improved stabilization in mood, thinking, and/or behavior Verbal commitment to aftercare and medication compliance  PRELIMINARY DISCHARGE PLAN: Attend aftercare/continuing care group Outpatient therapy  PATIENT/FAMILY INVOLVEMENT: This treatment plan has been presented to and reviewed with the patient, Sophia Garcia.  The patient and family have been given the opportunity to ask questions and make suggestions.  Delos Haring, RN 10/29/2019, 7:09 AM

## 2019-10-29 NOTE — [facility] Group Notes (Signed)
Type of Therapy/Topic: Identifying Irrational Beliefs/Thoughts  Participation Level: Active  Description of Group: The purpose of this group is to assist patients in learning to identify irrational beliefs and thoughts that contribute to their negative emotions and experience positive emotions. Patients will be guided to discuss ways in which they have been effected by irrational thoughts and beliefs and how to transform those irrational beliefs into rational ones. Newly identified rational beliefs will be juxtaposed with experiences of positive emotions or situations, and patients will be challenged to use rational beliefs or thoughts to combat negative ones. Special emphasis will be placed on coping with irrational beliefs in conflict situations, and patients will process healthy conflict resolution skills.  Therapeutic Goals: 1. Patient will identify two irrational thoughts or beliefs  to reflect on in order to balance out those thoughts 2. Patient will label two or more irrational thoughts/beliefs that they find the most difficult to cope with 3. Patient will demonstrate positive conflict resolution skills through discussion and/or role plays that will assist in transforming irrational thoughts or beliefs into positive ones.  Summary of Patient Progress:  Psycho-education worksheet provided. CSW and patient's discussed worksheet and addressed any questions. Patient's kept worksheet as a reference for health coping skills.    Therapeutic Modalities: Cognitive Behavioral Therapy Feelings Identification Dialectical Behavioral Therapy   
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
## 2019-10-29 NOTE — Tx Team (Signed)
Interdisciplinary Treatment and Diagnostic Plan Update  10/29/2019 Time of Session: 9:05am Sophia Garcia MRN: [id number]  Principal Diagnosis: <principal problem not specified>  Secondary Diagnoses: Active Problems:   MDD (major depressive disorder)   Current Medications:  Current Facility-Administered Medications  Medication Dose Route Frequency Provider Last Rate Last Admin  . busPIRone (BUSPAR) tablet 10 mg  10 mg Oral TID Sharma Covert, MD   10 mg at 10/28/19 1731  . FLUoxetine (PROZAC) capsule 40 mg  40 mg Oral Daily Sharma Covert, MD      . folic acid (FOLVITE) tablet 1 mg  1 mg Oral Daily Sharma Covert, MD   1 mg at 10/28/19 1538  . hydrOXYzine (ATARAX/VISTARIL) tablet 25 mg  25 mg Oral Q6H PRN Sharma Covert, MD   25 mg at 10/28/19 2200  . LORazepam (ATIVAN) tablet 1 mg  1 mg Oral Q6H PRN Sharma Covert, MD   1 mg at 10/28/19 2200  . nicotine (NICODERM CQ - dosed in mg/24 hours) patch 14 mg  14 mg Transdermal Daily Anike, Adaku C, NP      . thiamine tablet 100 mg  100 mg Oral Daily Sharma Covert, MD   100 mg at 10/28/19 1538  . traZODone (DESYREL) tablet 50 mg  50 mg Oral QHS PRN Sharma Covert, MD   50 mg at 10/28/19 2200  . valACYclovir (VALTREX) tablet 1,000 mg  1,000 mg Oral Daily Sharma Covert, MD       PTA Medications: Medications Prior to Admission  Medication Sig Dispense Refill Last Dose  . busPIRone (BUSPAR) 15 MG tablet Take 1 tablet (15 mg total) by mouth 3 (three) times daily. (Patient taking differently: Take 15 mg by mouth daily. ) 90 tablet 2   . FLUoxetine (PROZAC) 20 MG capsule Take 1 capsule (20 mg total) by mouth daily. 90 capsule 1   . hydrOXYzine (ATARAX/VISTARIL) 50 MG tablet Take 50 mg by mouth 3 (three) times daily.     . Melatonin 5 MG TABS Take 10 mg by mouth at bedtime.     . valACYclovir (VALTREX) 1000 MG tablet Take 1 tablet (1,000 mg total) by mouth daily. (Patient taking differently: Take 1,000 mg by mouth  daily. May take 1,037m twice daily as needed for outbreak.) 90 tablet 1   . loratadine-pseudoephedrine (CLARITIN-D 12 HOUR) 5-120 MG tablet Take 1 tablet by mouth 2 (two) times daily. For congestion and cough. (Patient not taking: Reported on 03/01/2019) 20 tablet 0 Not Taking at Unknown time  . QUEtiapine (SEROQUEL) 50 MG tablet Take 3 tablets (150 mg total) by mouth at bedtime. (Patient not taking: Reported on 10/28/2019) 90 tablet 1 Not Taking at Unknown time    Patient Stressors: Marital or family conflict Medication change or noncompliance  Patient Strengths: GTechnical sales engineerfor treatment/growth  Treatment Modalities: Medication Management, Group therapy, Case management,  1 to 1 session with clinician, Psychoeducation, Recreational therapy.   Physician Treatment Plan for Primary Diagnosis: <principal problem not specified> Long Term Goal(s):     Short Term Goals:    Medication Management: Evaluate patient's response, side effects, and tolerance of medication regimen.  Therapeutic Interventions: 1 to 1 sessions, Unit Group sessions and Medication administration.  Evaluation of Outcomes: Not Met  Physician Treatment Plan for Secondary Diagnosis: Active Problems:   MDD (major depressive disorder)  Long Term Goal(s):     Short Term Goals:       Medication  Management: Evaluate patient's response, side effects, and tolerance of medication regimen.  Therapeutic Interventions: 1 to 1 sessions, Unit Group sessions and Medication administration.  Evaluation of Outcomes: Not Met   RN Treatment Plan for Primary Diagnosis: <principal problem not specified> Long Term Goal(s): Knowledge of disease and therapeutic regimen to maintain health will improve  Short Term Goals: Ability to participate in decision making will improve, Ability to verbalize feelings will improve, Ability to disclose and discuss suicidal ideas, Ability to identify and develop effective  coping behaviors will improve and Compliance with prescribed medications will improve  Medication Management: RN will administer medications as ordered by provider, will assess and evaluate patient's response and provide education to patient for prescribed medication. RN will report any adverse and/or side effects to prescribing provider.  Therapeutic Interventions: 1 on 1 counseling sessions, Psychoeducation, Medication administration, Evaluate responses to treatment, Monitor vital signs and CBGs as ordered, Perform/monitor CIWA, COWS, AIMS and Fall Risk screenings as ordered, Perform wound care treatments as ordered.  Evaluation of Outcomes: Not Met   LCSW Treatment Plan for Primary Diagnosis: <principal problem not specified> Long Term Goal(s): Safe transition to appropriate next level of care at discharge, Engage patient in therapeutic group addressing interpersonal concerns.  Short Term Goals: Engage patient in aftercare planning with referrals and resources  Therapeutic Interventions: Assess for all discharge needs, 1 to 1 time with Social worker, Explore available resources and support systems, Assess for adequacy in community support network, Educate family and significant other(s) on suicide prevention, Complete Psychosocial Assessment, Interpersonal group therapy.  Evaluation of Outcomes: Not Met   Progress in Treatment: Attending groups: No. New to unit  Participating in groups: No. Taking medication as prescribed: Yes. Toleration medication: Yes. Family/Significant other contact made: No, will contact:  the patient's husband Patient understands diagnosis: Yes. Discussing patient identified problems/goals with staff: Yes. Medical problems stabilized or resolved: Yes. Denies suicidal/homicidal ideation: Yes. Issues/concerns per patient self-inventory: No. Other:   New problem(s) identified: None   New Short Term/Long Term Goal(s): Detox, medication stabilization,  elimination of SI thoughts, development of comprehensive mental wellness plan.    Patient Goals:  "To get better with my depression. I want a residential treatment"   Discharge Plan or Barriers: Patient recently admitted. CSW will continue to follow and assess for appropriate referrals and possible discharge planning.    Reason for Continuation of Hospitalization: Anxiety Depression Medication stabilization Suicidal ideation  Estimated Length of Stay: 3-5 days   Attendees: Patient: Sophia Garcia  10/29/2019 8:50 AM  Physician: Dr. Myles Lipps, MD 10/29/2019 8:50 AM  Nursing:  10/29/2019 8:50 AM  RN Care Manager: 10/29/2019 8:50 AM  Social Worker: Radonna Ricker, LCSW 10/29/2019 8:50 AM  Recreational Therapist:  10/29/2019 8:50 AM  Other:  10/29/2019 8:50 AM  Other:  10/29/2019 8:50 AM  Other: 10/29/2019 8:50 AM    Scribe for Treatment Team: Marylee Floras, [place] 10/29/2019 8:50 AM

## 2019-10-29 NOTE — Progress Notes (Signed)
Patient ID: Sophia Garcia, female   DOB: [birth year], 34 y.o.   MRN: [id number]   D: Patient pleasant on approach. Smiles some when interacting. Reports having anxiety and tremors related to alcohol withdrawal but reports anxiety is usually a big issue for her anyway.  A: Staff will monitor q 15 minutes, give medications, and follow treatment plan ans scheduled. R: Calm and cooperative on the unit

## 2019-10-29 NOTE — Progress Notes (Signed)
Pt is alert and oriented to person, place, time and situation. Pt is calm, cooperative, flat affect, is visible in the dayroom, social with some of he peer but often is sitting quietly without interaction. Pt reports she feels depressed, rates it 7/10 on a 0-10 scale, 10 being worst. Pt denies SI/HI/AVH. Pt denies anxiety. Pt completed her self inventory tool, and reports her goal is to "work on my depression," and to "try and be positive." No distress noted, none reported. Will continue to monitor pt per Q15 minute face checks and monitor for safety and progress.

## 2019-10-29 NOTE — Progress Notes (Signed)
Pt brought over from the observation unit, much of the admission finished in obs

## 2019-10-29 NOTE — H&P (Signed)
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  The patient stated that the night prior to admission she was in the kitchen sharpening a 9 inch knife, and she was sharpening it to harm her self.  Her husband walked in and intervene.  She stated that she came in contact with her ex-husband, and that brought back memories of the toxic relationship.  She has a history of marijuana use disorder, opiate use disorder, cocaine use disorder, benzodiazepine use disorder and methamphetamines.  She drank 7-8 beers overnight.  On evaluation she admitted to suicidal ideation.  She stated that her last rehabilitation stay was in 2020, and she had been started on fluoxetine at that time.  During the course of her treatment it was maintained at 20 mg, but she felt as though it was not working had had self increased it to 40 mg.  She had only done that for 1 to 2 weeks last fall.  Her request today is to get her depression under better control.  She was admitted to the hospital for evaluation and stabilization.  Associated Signs/Symptoms:  Depression Symptoms:  depressed mood, insomnia, feelings of worthlessness/guilt, hopelessness, anxiety,  (Hypo) Manic Symptoms:  Labiality of Mood,  Anxiety Symptoms:  Excessive Worry,  Psychotic Symptoms:  Denies any hallucinations, delusions or paranoia  PTSD Symptoms: NA  Total Time spent  with patient: 1 hour  Past Psychiatric History: Methamphetamine use disorder  Is the patient at risk to self? No.  Has the patient been a risk to self in the past 6 months? Yes.    Has the patient been a risk to self within the distant past? Yes.    Is the patient a risk to others? No.  Has the patient been a risk to others in the past 6 months? No.  Has the patient been a risk to others within the distant past? No.   Prior Inpatient Therapy: Prior Inpatient Therapy: Yes Prior Therapy Dates: 2020 Prior Therapy Facilty/Provider(s): [facility] Reason for Treatment: SI/ substance abuse  Prior Outpatient Therapy: Prior Outpatient Therapy: Yes Prior Therapy Dates: ongoing Prior Therapy Facilty/Provider(s): [facility] Reason for Treatment: depression Does patient have an ACCT team?: No Does patient have Intensive In-House Services?  : No Does patient have [facility] services? : No Does patient have P4CC services?: No  Alcohol Screening: 1. How often do you have a drink containing alcohol?: 4 or more times a week 2. How many drinks containing alcohol do you have on a typical day when you are drinking?: 10 or more 3. How often do you have six or more drinks on one occasion?: Daily or almost daily AUDIT-C Score: 12 4. How often during the last year have you found that you were not able to stop drinking once you had started?: Weekly 5. How often during the last year have you failed to do what was normally expected from you becasue of drinking?: Daily or almost daily 6. How often during the last year have you needed a first drink  in the morning to get yourself going after a heavy drinking session?: Daily or almost daily 7. How often during the last year have you had a feeling of guilt of remorse after drinking?: Daily or almost daily 8. How often during the last year have you been unable to remember what happened the night before because you had been drinking?: Weekly 9. Have you or someone else been  injured as a result of your drinking?: No 10. Has a relative or friend or a doctor or another health worker been concerned about your drinking or suggested you cut down?: No Alcohol Use Disorder Identification Test Final Score (AUDIT): 30 Alcohol Brief Interventions/Follow-up: Brief Advice, Continued Monitoring, Medication Offered/Prescribed  Substance Abuse History in the last 12 months:  Yes.    Consequences of Substance Abuse: Medical Consequences:  Liver damage, Possible death by overdose Legal Consequences:  Arrests, jail time, Loss of driving privilege. Family Consequences:  Family discord, divorce and or separation.  Previous Psychotropic Medications: Yes (Seroquel, Hydroxyzine, Prozac)  Psychological Evaluations: No   Past Medical History:  Past Medical History:  Diagnosis Date  . BV (bacterial vaginosis)   . Genital herpes   . HPV (human papilloma virus) infection   . Normal pregnancy, incidental 12/23/2011  . Preterm labor   . Yeast infection     Past Surgical History:  Procedure Laterality Date  . CESAREAN SECTION    . CESAREAN SECTION  06/12/2012   Procedure: CESAREAN SECTION;  Surgeon: Kirkland HunArthur Stringer, MD;  Location: [facility];  Service: Obstetrics;  Laterality: N/A;  Repeat  . CHOLECYSTECTOMY N/A 01/03/2014   Procedure: LAPAROSCOPIC CHOLECYSTECTOMY;  Surgeon: Dalia HeadingMark A Jenkins, MD;  Location: [facility];  Service: General;  Laterality: N/A;  . FOOT SURGERY  2002   STEPPED ON SEWING NEEDLE  . TOOTH EXTRACTION  2009   ONE TOOTH  . TUBAL LIGATION     Family History:  Family History  Problem Relation Age of Onset  . Diabetes Father        ORAL MEDS  . Cancer Paternal Aunt        BREAST;LIVER  . Diabetes Paternal Aunt   . Diabetes Paternal Uncle   . COPD Paternal Uncle   . Cancer Maternal Grandmother        BREAST  . COPD Mother        EMPHYSEMA  . Diabetes Paternal Grandfather   . Other Neg Hx    Family Psychiatric  History: Paternal Aunt: major depression                                                       Father: major depression/substance use disorder.  Tobacco Screening: Have you used any form of tobacco in the last 30 days? (Cigarettes, Smokeless Tobacco, Cigars, and/or Pipes): Yes Tobacco use, Select all that apply: 4 or less cigarettes per day Are you interested in Tobacco Cessation Medications?: Yes, will notify MD for an order Counseled patient on smoking cessation including recognizing danger situations, developing coping skills and basic information about quitting provided: Refused/Declined practical counseling  Social History:  Social History   Substance and Sexual Activity  Alcohol Use Yes   Comment: occasionally     Social History   Substance and Sexual Activity  Drug Use Yes  . Types: Marijuana   Comment: occasionally  Additional Social History: Marital status: Separated Pain Medications: see MAR Prescriptions: see MAR Over the Counter: see MAR History of alcohol / drug use?: Yes Name of Substance 1: alcohol 1 - Age of First Use: UTA 1 - Amount (size/oz): 6-7 1 - Frequency: daily 1 - Duration: 6 months 1 - Last Use / Amount: 3/10- 6 beers  Allergies:   Allergies  Allergen Reactions  . Latex Other (See Comments)    Yeast infection  . Baby Oil Rash  . Morphine And Related Rash   Lab Results:  Results for orders placed or performed during the hospital encounter of 10/28/19 (from the past 48 hour(s))  Respiratory Panel by RT PCR (Flu A&B, Covid) - Nasopharyngeal Swab     Status: None   Collection Time: 10/28/19  1:22 PM   Specimen: Nasopharyngeal Swab  Result Value Ref Range   SARS Coronavirus 2 by RT PCR NEGATIVE NEGATIVE    Comment: (NOTE) SARS-CoV-2 target nucleic acids are NOT DETECTED. The SARS-CoV-2 RNA is generally detectable in upper respiratoy specimens during the acute phase of infection. The lowest concentration of SARS-CoV-2 viral copies this assay can detect is 131 copies/mL. A negative result  does not preclude SARS-Cov-2 infection and should not be used as the sole basis for treatment or other patient management decisions. A negative result may occur with  improper specimen collection/handling, submission of specimen other than nasopharyngeal swab, presence of viral mutation(s) within the areas targeted by this assay, and inadequate number of viral copies (<131 copies/mL). A negative result must be combined with clinical observations, patient history, and epidemiological information. The expected result is Negative. Fact Sheet for Patients:  https://www.moore.com/ Fact Sheet for Healthcare Providers:  https://www.young.biz/ This test is not yet ap proved or cleared by the [place] FDA and  has been authorized for detection and/or diagnosis of SARS-CoV-2 by FDA under an Emergency Use Authorization (EUA). This EUA will remain  in effect (meaning this test can be used) for the duration of the COVID-19 declaration under Section 564(b)(1) of the Act, 21 U.S.C. section 360bbb-3(b)(1), unless the authorization is terminated or revoked sooner.    Influenza A by PCR NEGATIVE NEGATIVE   Influenza B by PCR NEGATIVE NEGATIVE    Comment: (NOTE) The Xpert Xpress SARS-CoV-2/FLU/RSV assay is intended as an aid in  the diagnosis of influenza from Nasopharyngeal swab specimens and  should not be used as a sole basis for treatment. Nasal washings and  aspirates are unacceptable for Xpert Xpress SARS-CoV-2/FLU/RSV  testing. Fact Sheet for Patients: https://www.moore.com/ Fact Sheet for Healthcare Providers: https://www.young.biz/ This test is not yet approved or cleared by the [place] FDA and  has been authorized for detection and/or diagnosis of SARS-CoV-2 by  FDA under an Emergency Use Authorization (EUA). This EUA will remain  in effect (meaning this test can be used) for the duration of the   Covid-19 declaration under Section 564(b)(1) of the Act, 21  U.S.C. section 360bbb-3(b)(1), unless the authorization is  terminated or revoked. Performed at [facility], [address], [place], [place] [address]   CBC with Differential/Platelet     Status: Abnormal   Collection Time: 10/28/19  6:33 PM  Result Value Ref Range   WBC 7.8 4.0 - 10.5 K/uL   RBC 4.22 3.87 - 5.11 MIL/uL   Hemoglobin 12.5 12.0 - 15.0 g/dL   HCT 16.0 10.9 - 32.3 %   MCV 91.7 80.0 - 100.0 fL   MCH 29.6 26.0 - 34.0  pg   MCHC 32.3 30.0 - 36.0 g/dL   RDW 14.6 11.5 - 15.5 %   Platelets 546 (H) 150 - 400 K/uL   nRBC 0.0 0.0 - 0.2 %   Neutrophils Relative % 68 %   Neutro Abs 5.3 1.7 - 7.7 K/uL   Lymphocytes Relative 24 %   Lymphs Abs 1.9 0.7 - 4.0 K/uL   Monocytes Relative 6 %   Monocytes Absolute 0.5 0.1 - 1.0 K/uL   Eosinophils Relative 2 %   Eosinophils Absolute 0.2 0.0 - 0.5 K/uL   Basophils Relative 0 %   Basophils Absolute 0.0 0.0 - 0.1 K/uL   Immature Granulocytes 0 %   Abs Immature Granulocytes 0.02 0.00 - 0.07 K/uL    Comment: Performed at [facility], [place] [address] [place], [place] [address]  Comprehensive metabolic panel     Status: Abnormal   Collection Time: 10/28/19  6:33 PM  Result Value Ref Range   Sodium 140 135 - 145 mmol/L   Potassium 3.5 3.5 - 5.1 mmol/L   Chloride 107 98 - 111 mmol/L   CO2 25 22 - 32 mmol/L   Glucose, Bld 127 (H) 70 - 99 mg/dL    Comment: Glucose reference range applies only to samples taken after fasting for at least 8 hours.   BUN 15 6 - 20 mg/dL   Creatinine, Ser 0.56 0.44 - 1.00 mg/dL   Calcium 9.4 8.9 - 10.3 mg/dL   Total Protein 7.3 6.5 - 8.1 g/dL   Albumin 4.3 3.5 - 5.0 g/dL   AST 18 15 - 41 U/L   ALT 15 0 - 44 U/L   Alkaline Phosphatase 24 (L) 38 - 126 U/L   Total Bilirubin 1.0 0.3 - 1.2 mg/dL   GFR calc non Af Amer >60 >60 mL/min   GFR calc Af Amer >60 >60 mL/min   Anion gap 8 5 - 15    Comment: Performed  at [facility], [place] [address] [place], [place] [address]  Ethanol     Status: None   Collection Time: 10/28/19  6:33 PM  Result Value Ref Range   Alcohol, Ethyl (B) <10 <10 mg/dL    Comment: (NOTE) Lowest detectable limit for serum alcohol is 10 mg/dL. For medical purposes only. Performed at [facility], [place] [address], [place], [place] [address]    Blood Alcohol level:  Lab Results  Component Value Date   ETH <10 10/28/2019   ETH <10 05/11/3006   Metabolic Disorder Labs:  No results found for: HGBA1C, MPG No results found for: PROLACTIN No results found for: CHOL, TRIG, HDL, CHOLHDL, VLDL, LDLCALC  Current Medications: Current Facility-Administered Medications  Medication Dose Route Frequency Provider Last Rate Last Admin  . aspirin EC tablet 81 mg  81 mg Oral Daily Sharma Covert, MD      . busPIRone (BUSPAR) tablet 10 mg  10 mg Oral TID Sharma Covert, MD   10 mg at 10/29/19 0902  . FLUoxetine (PROZAC) capsule 40 mg  40 mg Oral Daily Sharma Covert, MD   40 mg at 10/29/19 0859  . folic acid (FOLVITE) tablet 1 mg  1 mg Oral Daily Sharma Covert, MD   1 mg at 10/29/19 0902  . hydrOXYzine (ATARAX/VISTARIL) tablet 25 mg  25 mg Oral Q6H PRN Sharma Covert, MD   25 mg at 10/29/19 0904  . LORazepam (ATIVAN) tablet 1 mg  1 mg Oral  Q6H PRN Antonieta Pert, MD   1 mg at 10/28/19 2200  . nicotine (NICODERM CQ - dosed in mg/24 hours) patch 14 mg  14 mg Transdermal Daily Anike, Adaku C, NP   14 mg at 10/29/19 0903  . pantoprazole (PROTONIX) EC tablet 40 mg  40 mg Oral Daily Antonieta Pert, MD      . thiamine tablet 100 mg  100 mg Oral Daily Antonieta Pert, MD   100 mg at 10/29/19 0859  . traZODone (DESYREL) tablet 50 mg  50 mg Oral QHS PRN Antonieta Pert, MD   50 mg at 10/28/19 2200  . valACYclovir (VALTREX) tablet 1,000 mg  1,000 mg Oral Daily Antonieta Pert, MD   1,000 mg at 10/29/19 8416   PTA  Medications: Medications Prior to Admission  Medication Sig Dispense Refill Last Dose  . busPIRone (BUSPAR) 15 MG tablet Take 1 tablet (15 mg total) by mouth 3 (three) times daily. (Patient taking differently: Take 15 mg by mouth daily. ) 90 tablet 2   . FLUoxetine (PROZAC) 20 MG capsule Take 1 capsule (20 mg total) by mouth daily. 90 capsule 1   . hydrOXYzine (ATARAX/VISTARIL) 50 MG tablet Take 50 mg by mouth 3 (three) times daily.     . Melatonin 5 MG TABS Take 10 mg by mouth at bedtime.     . valACYclovir (VALTREX) 1000 MG tablet Take 1 tablet (1,000 mg total) by mouth daily. (Patient taking differently: Take 1,000 mg by mouth daily. May take 1,000mg  twice daily as needed for outbreak.) 90 tablet 1   . loratadine-pseudoephedrine (CLARITIN-D 12 HOUR) 5-120 MG tablet Take 1 tablet by mouth 2 (two) times daily. For congestion and cough. (Patient not taking: Reported on 03/01/2019) 20 tablet 0 Not Taking at Unknown time  . QUEtiapine (SEROQUEL) 50 MG tablet Take 3 tablets (150 mg total) by mouth at bedtime. (Patient not taking: Reported on 10/28/2019) 90 tablet 1 Not Taking at Unknown time   Musculoskeletal: Strength & Muscle Tone: within normal limits Gait & Station: normal Patient leans: N/A  Psychiatric Specialty Exam: Physical Exam  Nursing note and vitals reviewed. Constitutional: She is oriented to person, place, and time. She appears well-developed.  Cardiovascular: Normal rate.  Respiratory: Effort normal.  Genitourinary:    Genitourinary Comments: Deferred   Musculoskeletal:        General: Normal range of motion.     Cervical back: Normal range of motion.  Neurological: She is alert and oriented to person, place, and time.  Skin: Skin is warm and dry.    Review of Systems  Constitutional: Negative for chills, diaphoresis and fever.  HENT: Negative for congestion, rhinorrhea, sneezing and sore throat.   Respiratory: Negative for cough, shortness of breath and wheezing.    Cardiovascular: Negative for chest pain and palpitations.  Gastrointestinal: Negative for diarrhea, nausea and vomiting.  Genitourinary: Negative for difficulty urinating.  Musculoskeletal: Negative for arthralgias and myalgias.  Allergic/Immunologic: Positive for environmental allergies (Latex, Baby oil).       Morphine  Neurological: Negative for dizziness, syncope and light-headedness.  Psychiatric/Behavioral: Positive for dysphoric mood and sleep disturbance. Negative for agitation, behavioral problems, confusion, decreased concentration and hallucinations. The patient is nervous/anxious. The patient is not hyperactive.     Blood pressure 108/73, pulse 97, temperature 97.7 F (36.5 C), temperature source Oral, resp. rate 18, SpO2 98 %.There is no height or weight on file to calculate BMI.  General Appearance: Casual  Eye Contact:  Fair  Speech:  Normal Rate  Volume:  Decreased  Mood:  Anxious and Depressed  Affect:  Congruent  Thought Process:  Coherent and Descriptions of Associations: Circumstantial  Orientation:  Full (Time, Place, and Person)  Thought Content:  Logical  Suicidal Thoughts:  Yes.  without intent/plan  Homicidal Thoughts:  No  Memory:  Immediate;   Fair Recent;   Fair Remote;   Fair  Judgement:  Intact  Insight:  Fair  Psychomotor Activity:  Decreased  Concentration:  Concentration: Fair and Attention Span: Fair  Recall:  [employer] of Knowledge:  Fair  Language:  Good  Akathisia:  Negative  Handed:  Right  AIMS (if indicated):     Assets:  Desire for Improvement Housing Resilience  ADL's:  Intact  Cognition    Sleep: New admit   Treatment Plan Summary: Daily contact with patient to assess and evaluate symptoms and progress in treatment and Medication management.  Treatment Plan/Recommendations:  1. Admit for crisis management and stabilization, estimated length of stay 3-5 days.    2. Medication management to reduce current symptoms to base  line and improve the patient's overall level of functioning: See MAR, Md's SRA & treatment plan.   Observation Level/Precautions:  15 minute checks  Laboratory:  Per ED, UDs (+) for [facility]  Psychotherapy: Group sessions   Medications: See [facility]  Consultations: As needed    Discharge Concerns: Safety, mood stability  Estimated LOS: 3-5 days  Other: Admit to the 300-hall.   Physician Treatment Plan for Primary Diagnosis: MDD (major depressive disorder)  Long Term Goal(s): Improvement in symptoms so as ready for discharge  Short Term Goals: Ability to identify changes in lifestyle to reduce recurrence of condition will improve, Ability to verbalize feelings will improve and Ability to demonstrate self-control will improve  Physician Treatment Plan for Secondary Diagnosis: Principal Problem:   MDD (major depressive disorder)  Long Term Goal(s): Improvement in symptoms so as ready for discharge  Short Term Goals: Ability to identify and develop effective coping behaviors will improve, Compliance with prescribed medications will improve and Ability to identify triggers associated with substance abuse/mental health issues will improve  I certify that inpatient services furnished can reasonably be expected to improve the patient's condition.    Armandina Stammer, NP, PMHNP, FNP-BC 3/12/202110:42 AM

## 2019-10-30 NOTE — Progress Notes (Signed)
   10/30/19 1300  Psych Admission Type (Psych Patients Only)  Admission Status Voluntary  Psychosocial Assessment  Patient Complaints Anxiety  Eye Contact Fair  Facial Expression Flat  Affect Appropriate to circumstance  Speech Logical/coherent  Interaction Assertive  Motor Activity Fidgety;Slow  Appearance/Hygiene Unremarkable  Behavior Characteristics Cooperative;Appropriate to situation  Mood Anxious  Aggressive Behavior  Effect No apparent injury  Thought Process  Coherency WDL  Content WDL  Delusions WDL  Perception WDL  Hallucination None reported or observed  Judgment WDL  Confusion WDL  Danger to Self  Current suicidal ideation? Denies  Self-Injurious Behavior No self-injurious ideation or behavior indicators observed or expressed   Agreement Not to Harm Self Yes  Description of Agreement Verbal  Danger to Others  Danger to Others None reported or observed

## 2019-10-30 NOTE — Progress Notes (Signed)
[facility] MD Progress Note  10/30/2019 12:36 PM Sophia Garcia  MRN:  [id number]  Subjective: Sophia Garcia reports, "I'm feeling a lot better. I think I'm ready to be discharged in the morning. I feel really good".  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 The patient stated that the night prior to admission she was in the kitchen sharpening a 9 inch knife, and she was sharpening itto harm her self. Her husband walked in and intervene. She stated that she came in contact with her ex-husband, and that brought back memories of the toxic relationship. She has a history of marijuana use disorder, opiate use disorder, cocaine use disorder, benzodiazepine use disorder and methamphetamines. Tahirih is seen, chart reviewed. The char findings discussed with the treatment team. She presents alert, oriented & aware of situation. She is visible on the unit, attending group sessions. She says she is doing a lot better. She denies any symptoms of depression or anxiety. She denies any SIHI, AVH, delusional thoughts or paranoia. She is taking & tolerating her treatment regimen. She denies any side effects. She says she feels she is stable enough to be discharged tomorrow morning. She plans to go to her friend's house to reside after discharge.  Ikeisha is in agreement to continue her current plan of care as already in progress. She does not appear to be responding to any internal stimuli.  Principal Problem: MDD (major depressive disorder)  Diagnosis: Principal Problem:   MDD (major depressive disorder) Active Problems:   Depression   Major depression  Total Time spent with patient: 25 minutes  Past Psychiatric History: Major depressive disorder.                                              Amphetamine use disorders.  Past Medical History:  Past Medical History:  Diagnosis Date  . BV (bacterial vaginosis)   . Genital herpes   . HPV (human papilloma virus) infection   . Normal pregnancy, incidental 12/23/2011  . Preterm labor   . Yeast infection     Past Surgical History:  Procedure Laterality Date  . CESAREAN SECTION    . CESAREAN SECTION  06/12/2012   Procedure: CESAREAN SECTION;  Surgeon: Kirkland Hun, MD;  Location: [facility];  Service: Obstetrics;  Laterality: N/A;  Repeat  . CHOLECYSTECTOMY N/A 01/03/2014   Procedure: LAPAROSCOPIC CHOLECYSTECTOMY;  Surgeon: Dalia Heading, MD;  Location: [facility];  Service: General;  Laterality: N/A;  . FOOT SURGERY  2002   STEPPED ON SEWING NEEDLE  . TOOTH EXTRACTION  2009   ONE TOOTH  . TUBAL LIGATION     Family History:  Family History  Problem Relation Age of Onset  . Diabetes Father        ORAL MEDS  . Cancer Paternal Aunt        BREAST;LIVER  . Diabetes Paternal Aunt   . Diabetes Paternal Uncle   . COPD Paternal Uncle   . Cancer Maternal Grandmother        BREAST  . COPD Mother        EMPHYSEMA  . Diabetes Paternal Grandfather   . Other Neg Hx    Family Psychiatric  History: See  H&P  Social History:  Social History   Substance and Sexual Activity  Alcohol Use Yes   Comment: occasionally     Social History   Substance and Sexual Activity  Drug Use Yes  . Types: Marijuana   Comment: occasionally    Social History   Socioeconomic History  . Marital status: Married    Spouse name: Not on file  . Number of children: Not on file  . Years of education: 64  . Highest education level: Not on file  Occupational History  . Not on file  Tobacco Use  . Smoking status: Current Every Day Smoker    Packs/day: 0.50    Years: 12.00    Pack years: 6.00    Types: Cigarettes  . Smokeless tobacco: Never Used  . Tobacco comment: 1 pack lasts 4 days  Substance and Sexual Activity  . Alcohol use: Yes    Comment:  occasionally  . Drug use: Yes    Types: Marijuana    Comment: occasionally  . Sexual activity: Yes    Partners: Male    Birth control/protection: Surgical    Comment: BTL   Other Topics Concern  . Not on file  Social History Narrative  . Not on file   Social Determinants of Health   Financial Resource Strain: Unknown  . Difficulty of Paying Living Expenses: Patient refused  Food Insecurity: Unknown  . Worried About Programme researcher, broadcasting/film/video in the Last Year: Patient refused  . Ran Out of Food in the Last Year: Patient refused  Transportation Needs: Unknown  . Lack of Transportation (Medical): Patient refused  . Lack of Transportation (Non-Medical): Patient refused  Physical Activity: Unknown  . Days of Exercise per Week: Patient refused  . Minutes of Exercise per Session: Patient refused  Stress: Stress Concern Present  . Feeling of Stress : Very much  Social Connections: Unknown  . Frequency of Communication with Friends and Family: Patient refused  . Frequency of Social Gatherings with Friends and Family: Patient refused  . Attends Religious Services: Patient refused  . Active Member of Clubs or Organizations: Patient refused  . Attends Banker Meetings: Patient refused  . Marital Status: Patient refused   Additional Social History:    Pain Medications: see MAR Prescriptions: see MAR Over the Counter: see MAR History of alcohol / drug use?: Yes Name of Substance 1: alcohol 1 - Age of First Use: UTA 1 - Amount (size/oz): 6-7 1 - Frequency: daily 1 - Duration: 6 months 1 - Last Use / Amount: 3/10- 6 beers  Sleep: Good  Appetite:  Good  Current Medications: Current Facility-Administered Medications  Medication Dose Route Frequency Provider Last Rate Last Admin  . aspirin EC tablet 81 mg  81 mg Oral Daily Antonieta Pert, MD   81 mg at 10/30/19 0905  . busPIRone (BUSPAR) tablet 10 mg  10 mg Oral TID Antonieta Pert, MD   10 mg at 10/30/19 0905  .  FLUoxetine (PROZAC) capsule 40 mg  40 mg Oral Daily Antonieta Pert, MD   40 mg at 10/30/19 0904  . folic acid (FOLVITE) tablet 1 mg  1 mg Oral Daily Antonieta Pert, MD   1 mg at 10/30/19 0905  . hydrOXYzine (ATARAX/VISTARIL) tablet 25 mg  25 mg Oral Q6H PRN Antonieta Pert, MD   25 mg at 10/29/19 0904  . LORazepam (ATIVAN) tablet 1 mg  1 mg Oral Q6H PRN Antonieta Pert, MD  1 mg at 10/29/19 2114  . nicotine (NICODERM CQ - dosed in mg/24 hours) patch 14 mg  14 mg Transdermal Daily Anike, Adaku C, NP   14 mg at 10/30/19 0908  . pantoprazole (PROTONIX) EC tablet 40 mg  40 mg Oral Daily Antonieta Pert, MD   40 mg at 10/30/19 0904  . thiamine tablet 100 mg  100 mg Oral Daily Antonieta Pert, MD   100 mg at 10/30/19 0905  . traZODone (DESYREL) tablet 50 mg  50 mg Oral QHS PRN Antonieta Pert, MD   50 mg at 10/29/19 2114  . valACYclovir (VALTREX) tablet 1,000 mg  1,000 mg Oral Daily Antonieta Pert, MD   1,000 mg at 10/30/19 4098    Lab Results:  Results for orders placed or performed during the hospital encounter of 10/28/19 (from the past 48 hour(s))  Respiratory Panel by RT PCR (Flu A&B, Covid) - Nasopharyngeal Swab     Status: None   Collection Time: 10/28/19  1:22 PM   Specimen: Nasopharyngeal Swab  Result Value Ref Range   SARS Coronavirus 2 by RT PCR NEGATIVE NEGATIVE    Comment: (NOTE) SARS-CoV-2 target nucleic acids are NOT DETECTED. The SARS-CoV-2 RNA is generally detectable in upper respiratoy specimens during the acute phase of infection. The lowest concentration of SARS-CoV-2 viral copies this assay can detect is 131 copies/mL. A negative result does not preclude SARS-Cov-2 infection and should not be used as the sole basis for treatment or other patient management decisions. A negative result may occur with  improper specimen collection/handling, submission of specimen other than nasopharyngeal swab, presence of viral mutation(s) within the areas  targeted by this assay, and inadequate number of viral copies (<131 copies/mL). A negative result must be combined with clinical observations, patient history, and epidemiological information. The expected result is Negative. Fact Sheet for Patients:  https://www.moore.com/ Fact Sheet for Healthcare Providers:  https://www.young.biz/ This test is not yet ap proved or cleared by the [place] FDA and  has been authorized for detection and/or diagnosis of SARS-CoV-2 by FDA under an Emergency Use Authorization (EUA). This EUA will remain  in effect (meaning this test can be used) for the duration of the COVID-19 declaration under Section 564(b)(1) of the Act, 21 U.S.C. section 360bbb-3(b)(1), unless the authorization is terminated or revoked sooner.    Influenza A by PCR NEGATIVE NEGATIVE   Influenza B by PCR NEGATIVE NEGATIVE    Comment: (NOTE) The Xpert Xpress SARS-CoV-2/FLU/RSV assay is intended as an aid in  the diagnosis of influenza from Nasopharyngeal swab specimens and  should not be used as a sole basis for treatment. Nasal washings and  aspirates are unacceptable for Xpert Xpress SARS-CoV-2/FLU/RSV  testing. Fact Sheet for Patients: https://www.moore.com/ Fact Sheet for Healthcare Providers: https://www.young.biz/ This test is not yet approved or cleared by the [place] FDA and  has been authorized for detection and/or diagnosis of SARS-CoV-2 by  FDA under an Emergency Use Authorization (EUA). This EUA will remain  in effect (meaning this test can be used) for the duration of the  Covid-19 declaration under Section 564(b)(1) of the Act, 21  U.S.C. section 360bbb-3(b)(1), unless the authorization is  terminated or revoked. Performed at [facility], [address] [place], [place] [address]   CBC with Differential/Platelet     Status: Abnormal   Collection Time:  10/28/19  6:33 PM  Result Value Ref Range   WBC 7.8 4.0 - 10.5 K/uL  RBC 4.22 3.87 - 5.11 MIL/uL   Hemoglobin 12.5 12.0 - 15.0 g/dL   HCT 16.1 09.6 - 04.5 %   MCV 91.7 80.0 - 100.0 fL   MCH 29.6 26.0 - 34.0 pg   MCHC 32.3 30.0 - 36.0 g/dL   RDW 40.9 81.1 - 91.4 %   Platelets 546 (H) 150 - 400 K/uL   nRBC 0.0 0.0 - 0.2 %   Neutrophils Relative % 68 %   Neutro Abs 5.3 1.7 - 7.7 K/uL   Lymphocytes Relative 24 %   Lymphs Abs 1.9 0.7 - 4.0 K/uL   Monocytes Relative 6 %   Monocytes Absolute 0.5 0.1 - 1.0 K/uL   Eosinophils Relative 2 %   Eosinophils Absolute 0.2 0.0 - 0.5 K/uL   Basophils Relative 0 %   Basophils Absolute 0.0 0.0 - 0.1 K/uL   Immature Granulocytes 0 %   Abs Immature Granulocytes 0.02 0.00 - 0.07 K/uL    Comment: Performed at [facility], [address], [place], [place] [address]  Comprehensive metabolic panel     Status: Abnormal   Collection Time: 10/28/19  6:33 PM  Result Value Ref Range   Sodium 140 135 - 145 mmol/L   Potassium 3.5 3.5 - 5.1 mmol/L   Chloride 107 98 - 111 mmol/L   CO2 25 22 - 32 mmol/L   Glucose, Bld 127 (H) 70 - 99 mg/dL    Comment: Glucose reference range applies only to samples taken after fasting for at least 8 hours.   BUN 15 6 - 20 mg/dL   Creatinine, Ser 6.21 0.44 - 1.00 mg/dL   Calcium 9.4 8.9 - 30.8 mg/dL   Total Protein 7.3 6.5 - 8.1 g/dL   Albumin 4.3 3.5 - 5.0 g/dL   AST 18 15 - 41 U/L   ALT 15 0 - 44 U/L   Alkaline Phosphatase 24 (L) 38 - 126 U/L   Total Bilirubin 1.0 0.3 - 1.2 mg/dL   GFR calc non Af Amer >60 >60 mL/min   GFR calc Af Amer >60 >60 mL/min   Anion gap 8 5 - 15    Comment: Performed at [facility], [address], [place], [place] [address]  Ethanol     Status: None   Collection Time: 10/28/19  6:33 PM  Result Value Ref Range   Alcohol, Ethyl (B) <10 <10 mg/dL    Comment: (NOTE) Lowest detectable limit for serum alcohol is 10 mg/dL. For medical purposes  only. Performed at [facility], [address], [place], [place] [address]   Hemoglobin A1c     Status: None   Collection Time: 10/29/19  6:42 PM  Result Value Ref Range   Hgb A1c MFr Bld 5.0 4.8 - 5.6 %    Comment: (NOTE) Pre diabetes:          5.7%-6.4% Diabetes:              >6.4% Glycemic control for   <7.0% adults with diabetes    Mean Plasma Glucose 96.8 mg/dL    Comment: Performed at [facility], [address], [place], [place] [address]  TSH     Status: Abnormal   Collection Time: 10/29/19  6:42 PM  Result Value Ref Range   TSH 0.039 (L) 0.350 - 4.500 uIU/mL    Comment: Performed by a 3rd Generation assay with a functional sensitivity of <=0.01 uIU/mL. Performed at [facility], [address] [place], [place] [address]   Rapid urine drug screen (hospital performed)     Status: None   Collection Time: 10/29/19  7:40 PM  Result Value Ref Range   Opiates NONE DETECTED NONE DETECTED   Cocaine NONE DETECTED NONE DETECTED   Benzodiazepines NONE DETECTED NONE DETECTED   Amphetamines NONE DETECTED NONE DETECTED   Tetrahydrocannabinol NONE DETECTED NONE DETECTED   Barbiturates NONE DETECTED NONE DETECTED    Comment: (NOTE) DRUG SCREEN FOR MEDICAL PURPOSES ONLY.  IF CONFIRMATION IS NEEDED FOR ANY PURPOSE, NOTIFY LAB WITHIN 5 DAYS. LOWEST DETECTABLE LIMITS FOR URINE DRUG SCREEN Drug Class                     Cutoff (ng/mL) Amphetamine and metabolites    1000 Barbiturate and metabolites    200 Benzodiazepine                 200 Tricyclics and metabolites     300 Opiates and metabolites        300 Cocaine and metabolites        300 THC                            50 Performed at [facility], [address] [place], [place] [address]   Pregnancy, urine     Status: None   Collection Time: 10/29/19  7:40 PM  Result Value Ref Range   Preg Test, Ur NEGATIVE NEGATIVE    Comment:        THE SENSITIVITY OF  THIS METHODOLOGY IS >20 mIU/mL. Performed at [facility], [address] [place], [place] [address]     Blood Alcohol level:  Lab Results  Component Value Date   ETH <10 10/28/2019   ETH <10 03/01/2019    Metabolic Disorder Labs: Lab Results  Component Value Date   HGBA1C 5.0 10/29/2019   MPG 96.8 10/29/2019   No results found for: PROLACTIN No results found for: CHOL, TRIG, HDL, CHOLHDL, VLDL, LDLCALC  Physical Findings: AIMS: Facial and Oral Movements Muscles of Facial Expression: None, normal Lips and Perioral Area: None, normal Jaw: None, normal Tongue: None, normal,Extremity Movements Upper (arms, wrists, hands, fingers): None, normal Lower (legs, knees, ankles, toes): None, normal, Trunk Movements Neck, shoulders, hips: None, normal, Overall Severity Severity of abnormal movements (highest score from questions above): None, normal Incapacitation due to abnormal movements: None, normal Patient's awareness of abnormal movements (rate only patient's report): No Awareness, Dental Status Current problems with teeth and/or dentures?: No Does patient usually wear dentures?: No  CIWA:  CIWA-Ar Total: 10 COWS:  COWS Total Score: 4  Musculoskeletal: Strength & Muscle Tone: within normal limits Gait & Station: normal Patient leans: N/A  Psychiatric Specialty Exam: Physical Exam  Nursing note and vitals reviewed. Constitutional: She is oriented to person, place, and time. She appears well-developed.  Cardiovascular: Normal rate.  Respiratory: Effort normal.  Genitourinary:    Genitourinary Comments: Deferred   Musculoskeletal:        General: Normal range of motion.     Cervical back: Normal range of motion.  Neurological: She is alert and oriented to person, place, and time.  Skin: Skin is warm and dry.    Review of Systems  Constitutional: Negative for chills, diaphoresis and fever.  HENT: Negative for congestion, rhinorrhea, sneezing and  sore throat.   Eyes: Negative for discharge.  Respiratory: Negative  for cough, shortness of breath and wheezing.   Cardiovascular: Negative for chest pain and palpitations.  Gastrointestinal: Negative for diarrhea, nausea and vomiting.  Genitourinary: Negative for difficulty urinating.  Musculoskeletal: Negative.   Skin: Negative.   Allergic/Immunologic:       Allergies: Morphine.  Others: Latex, Baby oil  Neurological: Negative for dizziness.  Psychiatric/Behavioral: Positive for dysphoric mood ("Improving"), self-injury and sleep disturbance ("Improving"). Negative for agitation, behavioral problems, confusion, decreased concentration, hallucinations and suicidal ideas. The patient is not nervous/anxious (Stable) and is not hyperactive.     Blood pressure 108/73, pulse 97, temperature 97.7 F (36.5 C), temperature source Oral, resp. rate 18, SpO2 98 %.There is no height or weight on file to calculate BMI.  General Appearance: Casual  Eye Contact:  Fair  Speech:  Normal Rate  Volume:  Decreased  Mood: "My mood is improving a lot".  Affect:  Congruent  Thought Process:  Coherent and Descriptions of Associations: Intact  Orientation:  Full (Time, Place, and Person)  Thought Content:  Logical  Suicidal Thoughts:  Denies any thoughts, plans or intent.  Homicidal Thoughts:  No  Memory:  Immediate;   Fair Recent;   Fair Remote;   Fair  Judgement:  Intact  Insight:  Fair  Psychomotor Activity:  Decreased  Concentration:  Concentration: Fair and Attention Span: Fair  Recall:  [employer] of Knowledge:  Fair  Language:  Good  Akathisia:  Negative  Handed:  Right  AIMS (if indicated):     Assets:  Desire for Improvement Housing Resilience  ADL's:  Intact  Cognition:  WNL    Sleep:  Number of Hours: 6.75   Treatment Plan Summary: Daily contact with patient to assess and evaluate symptoms and progress in treatment and Medication management.  - Continue inpatient  hospitalization.  - Will continue today 10/30/2019 plan as below except where it is noted.  Depression.     - Continue Prozac 40 mg po daily.  Anxiety.     - Continue Buspar 10 mg po tid.     - Hydroxyzine 25 mg po tid prn.     - Continue Lorazepam 1 mg po Q 6 hrs prn for CIWA.  GERD.      - Continue Protonix 40 mg po Q am.      - Continue Thiamine 100 mg po daily for thiamine replacement.      - Continue Folic acid 1 mg po daily for folate replacement.      - Continue Valtrex 1,000 mg po daily for herpes simplex.  Insomnia.       - Continue Trazodone 50 mg po prn.  - Patient to attend & participate in the group milieu. - Discharge disposition in progress.  Lindell Spar, NP, PMHNP, FNP-BC 10/30/2019, 12:36 PM

## 2019-10-30 NOTE — [facility] Group Notes (Signed)
LCSW Group Therapy Note  10/30/2019    10:00-11:00am   Type of Therapy and Topic:  Group Therapy: Early Messages Received About Anger  Participation Level:  Active   Description of Group:   In this group, patients shared and discussed the early messages received in their lives about anger through parental or other adult modeling, teaching, repression, punishment, violence, and more.  Participants identified how those childhood lessons influence even now how they usually or often react when angered.  The group discussed that anger is a secondary emotion and what may be the underlying emotional themes that come out through anger outbursts or that are ignored through anger suppression.  Finally, as a group there was a conversation about the workbook's quote that "There is nothing wrong with anger; it is just a sign something needs to change."     Therapeutic Goals: Patients will identify one or more childhood message about anger that they received and how it was taught to them. Patients will discuss how these childhood experiences have influenced and continue to influence their own expression or repression of anger even today. Patients will explore possible primary emotions that tend to fuel their secondary emotion of anger. Patients will learn that anger itself is normal and cannot be eliminated, and that healthier coping skills can assist with resolving conflict rather than worsening situations.  Summary of Patient Progress:  The patient shared that her childhood lessons about anger were from mother who would put her and her sister into a time-out, then would make them say they were sorry to each other and hug.  Later, as a teenager, their father instructed them to "walk off."  As a result, the patient learned to bottle up her anger.  However, she found that at some point she would explode and that would make her feel guilty.  She has been trying to journal about her feelings which is at least  letting them out and not bottling them up, which is helpful to some degree.  She was attentive and cooperative throughout group, seemed interested in what others shared.  Therapeutic Modalities: Cognitive Behavioral Therapy Motivation Interviewing  Lynnell Chad  .

## 2019-10-30 NOTE — Progress Notes (Signed)
[facility] Group Notes:  (Nursing/MHT/Case Management/Adjunct)  Date:  10/30/2019  Time: 2030  Type of Therapy:  wrap up group  Participation Level:  Active  Participation Quality:  Appropriate, Attentive, Sharing and Supportive  Affect:  Appropriate  Cognitive:  Appropriate  Insight:  Improving  Engagement in Group:  Engaged  Modes of Intervention:  Clarification, Education and Support  Summary of Progress/Problems: Positive thinking and positive change.   Marcille Buffy 10/30/2019, 9:00 PM

## 2019-10-30 NOTE — Progress Notes (Signed)
   10/30/19 2244  COVID-19 Daily Checkoff  Have you had a fever (temp > 37.80C/100F)  in the past 24 hours?  No  If you have had runny nose, nasal congestion, sneezing in the past 24 hours, has it worsened? No  COVID-19 EXPOSURE  Have you traveled outside the state in the past 14 days? No  Have you been in contact with someone with a confirmed diagnosis of COVID-19 or PUI in the past 14 days without wearing appropriate PPE? No  Have you been living in the same home as a person with confirmed diagnosis of COVID-19 or a PUI (household contact)? No  Have you been diagnosed with COVID-19? No

## 2019-10-30 NOTE — Progress Notes (Signed)
   10/30/19 2246  Psych Admission Type (Psych Patients Only)  Admission Status Voluntary  Psychosocial Assessment  Patient Complaints Anxiety  Eye Contact Fair  Facial Expression Flat  Affect Appropriate to circumstance  Speech Logical/coherent  Interaction Assertive  Motor Activity Other (Comment) (WDL)  Appearance/Hygiene Unremarkable  Behavior Characteristics Appropriate to situation  Mood Anxious  Thought Process  Coherency WDL  Content WDL  Delusions None reported or observed  Perception WDL  Hallucination None reported or observed  Judgment WDL  Confusion None  Danger to Self  Current suicidal ideation? Denies  Agreement Not to Harm Self Yes  Description of Agreement Verbal  Danger to Others  Danger to Others None reported or observed

## 2019-10-30 NOTE — Progress Notes (Signed)
Pt enrolled in the Q Actual Research Study on 10/29/2019 @ 1300    Daily check-in time @ 1230

## 2019-10-31 DIAGNOSIS — F332 Major depressive disorder, recurrent severe without psychotic features: Secondary | ICD-10-CM

## 2019-10-31 MED ORDER — NICOTINE 14 MG/24HR TD PT24: 14.0000 mg | MEDICATED_PATCH | Freq: Every day | TRANSDERMAL | 0 refills | Status: AC

## 2019-10-31 MED ORDER — PANTOPRAZOLE SODIUM 40 MG PO TBEC: 40.0000 mg | DELAYED_RELEASE_TABLET | Freq: Every day | ORAL | 0 refills | Status: AC

## 2019-10-31 MED ORDER — TRAZODONE HCL 50 MG PO TABS: 50.0000 mg | ORAL_TABLET | Freq: Every evening | ORAL | 0 refills | Status: DC | PRN

## 2019-10-31 MED ORDER — HYDROXYZINE HCL 25 MG PO TABS: 25.0000 mg | ORAL_TABLET | Freq: Four times a day (QID) | ORAL | 0 refills | Status: AC | PRN

## 2019-10-31 MED ORDER — ASPIRIN 81 MG PO TBEC: 81.0000 mg | DELAYED_RELEASE_TABLET | Freq: Every day | ORAL | 0 refills | Status: AC

## 2019-10-31 MED ORDER — FLUOXETINE HCL 40 MG PO CAPS: 40.0000 mg | ORAL_CAPSULE | Freq: Every day | ORAL | 0 refills | Status: AC

## 2019-10-31 MED ORDER — BUSPIRONE HCL 10 MG PO TABS: 10.0000 mg | ORAL_TABLET | Freq: Three times a day (TID) | ORAL | 0 refills | Status: AC

## 2019-10-31 NOTE — [facility] Suicide Risk Assessment (Signed)
[facility] INPATIENT:  Family/Significant Other Suicide Prevention Education  Suicide Prevention Education:  Education Completed; husband, Alsie Younes, [phone],  (name of family member/significant other) has been identified by the patient as the family member/significant other with whom the patient will be residing, and identified as the person(s) who will aid the patient in the event of a mental health crisis (suicidal ideations/suicide attempt).  With written consent from the patient, the family member/significant other has been provided the following suicide prevention education, prior to the and/or following the discharge of the patient.  Husband stated that patient told him a friend will be picking her up.  She will not be staying with him and the children for awhile.  He stated he hopes she feels safe to be around the children now.  CSW went over patient's medications.  He had no questions.  The suicide prevention education provided includes the following:  Suicide risk factors  Suicide prevention and interventions  National Suicide Hotline telephone number  [facility] assessment telephone number  [facility] Emergency Assistance 911  [facility] and/or Residential Mobile Crisis Unit telephone number  Request made of family/significant other to:  Remove weapons (e.g., guns, rifles, knives), all items previously/currently identified as safety concern.    Remove drugs/medications (over-the-counter, prescriptions, illicit drugs), all items previously/currently identified as a safety concern.  The family member/significant other verbalizes understanding of the suicide prevention education information provided.  The family member/significant other agrees to remove the items of safety concern listed above.  Carloyn Jaeger Grossman-Orr 10/31/2019, 9:51 AM

## 2019-10-31 NOTE — Discharge Summary (Signed)
Physician Discharge Summary Note  Patient:  Sophia Garcia is an 34 y.o., female  MRN:  [id number]  DOB:  06-Mar-1986  Patient phone:  [phone] (home)   Patient address:   [address] [place] [address],   Total Time spent with patient: 15 minutes  Date of Admission:  10/28/2019  Date of Discharge: 10/31/19  Reason for Admission: Suicidal ideations with plans to stab herself with a kitchen knife.   Principal Problem: MDD (major depressive disorder)  Discharge Diagnoses: Principal Problem:   MDD (major depressive disorder) Active Problems:   Depression   Major depression  Past Psychiatric History: Polysubstance use disorders since age 5.                                              Previous psych. Hospitalizations here at [facility].                                             Major depressive disorder, recurrent episodes.  Past Medical History:  Past Medical History:  Diagnosis Date  . BV (bacterial vaginosis)   . Genital herpes   . HPV (human papilloma virus) infection   . Normal pregnancy, incidental 12/23/2011  . Preterm labor   . Yeast infection     Past Surgical History:  Procedure Laterality Date  . CESAREAN SECTION    . CESAREAN SECTION  06/12/2012   Procedure: CESAREAN SECTION;  Surgeon: Kirkland Hun, MD;  Location: [facility];  Service: Obstetrics;  Laterality: N/A;  Repeat  . CHOLECYSTECTOMY N/A 01/03/2014   Procedure: LAPAROSCOPIC CHOLECYSTECTOMY;  Surgeon: Dalia Heading, MD;  Location: [facility];  Service: General;  Laterality: N/A;  . FOOT SURGERY  2002   STEPPED ON SEWING NEEDLE  . TOOTH EXTRACTION  2009   ONE TOOTH  . TUBAL LIGATION     Family History:  Family History  Problem Relation Age of Onset  . Diabetes Father        ORAL MEDS  . Cancer Paternal Aunt        BREAST;LIVER  . Diabetes Paternal Aunt   . Diabetes Paternal Uncle   . COPD Paternal Uncle   . Cancer Maternal Grandmother        BREAST  . COPD Mother         EMPHYSEMA  . Diabetes Paternal Grandfather   . Other Neg Hx    Family Psychiatric  History: See H&P  Social History:  Social History   Substance and Sexual Activity  Alcohol Use Yes   Comment: occasionally     Social History   Substance and Sexual Activity  Drug Use Yes  . Types: Marijuana   Comment: occasionally    Social History   Socioeconomic History  . Marital status: Married    Spouse name: Not on file  . Number of children: Not on file  . Years of education: 75  . Highest education level: Not on file  Occupational History  . Not on file  Tobacco Use  . Smoking status: Current Every Day Smoker    Packs/day: 0.50    Years: 12.00    Pack years: 6.00    Types: Cigarettes  . Smokeless tobacco: Never Used  . Tobacco  comment: 1 pack lasts 4 days  Substance and Sexual Activity  . Alcohol use: Yes    Comment: occasionally  . Drug use: Yes    Types: Marijuana    Comment: occasionally  . Sexual activity: Yes    Partners: Male    Birth control/protection: Surgical    Comment: BTL   Other Topics Concern  . Not on file  Social History Narrative  . Not on file   Social Determinants of Health   Financial Resource Strain: Unknown  . Difficulty of Paying Living Expenses: Patient refused  Food Insecurity: Unknown  . Worried About Programme researcher, broadcasting/film/video in the Last Year: Patient refused  . Ran Out of Food in the Last Year: Patient refused  Transportation Needs: Unknown  . Lack of Transportation (Medical): Patient refused  . Lack of Transportation (Non-Medical): Patient refused  Physical Activity: Unknown  . Days of Exercise per Week: Patient refused  . Minutes of Exercise per Session: Patient refused  Stress: Stress Concern Present  . Feeling of Stress : Very much  Social Connections: Unknown  . Frequency of Communication with Friends and Family: Patient refused  . Frequency of Social Gatherings with Friends and Family: Patient refused  . Attends Religious  Services: Patient refused  . Active Member of Clubs or Organizations: Patient refused  . Attends Banker Meetings: Patient refused  . 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 The patient stated that the night prior to admission she was in the kitchen sharpening a 9 inch knife, and she was sharpening itto harm herself. Her husband walked in and intervene. She stated that she came in contact with her ex-husband, and that brought back memories of the toxic relationship. She has a history of marijuana use disorder, opiate use disorder, cocaine use disorder, benzodiazepine use disorder and methamphetamines. She drank 7-8 beers overnight. On evaluation she admitted to suicidal ideation. She stated that her last rehabilitation stay was in 2020, and she had been started on fluoxetine at that time. During the course of her treatment it was maintained at 20 mg, but she felt as though it was not working had had self increased it to 40 mg. She had only done that for 1 to 2 weeks last fall. Her request today is to get her depression under better control.  This is the second discharge summary for Sophia Garcia, a 34 year old Caucasian female with hx of mental illness & substance use this orders. She was admitted to the [facility] this time around with complaints of suicidal ideations with plan to stab herself with a kitchen knife. She was brought to the hospital for evaluation & stabilization. After evaluation of her presenting symptoms, Sophia Garcia was recommended for mood stabilization treatments. The medication regimen for her presenting symptoms were discussed & with her consent initiated. She received,  stabilized & was discharged on the medications as listed below on her discharge medication lists. She was also enrolled & participated in the group counseling sessions being offered & held on this unit. She learned coping skills. She presented on this admission, other pre-existing medical conditions that required treatment & monitoring. She was treated & discharged on the medication used to treat those ailments. She tolerated her treatment regimen without  any adverse effects or reactions reported.   During the course of her hospitalization, the 15-minute checks were adequate to ensure Severa's safety. Patient did not display any dangerous, violent or suicidal behavior on the unit.  She interacted with patients & staff appropriately, participated appropriately in the group sessions/therapies. Her medications were addressed & adjusted to meet her needs. She was recommended for outpatient follow-up care & medication management upon discharge to assure her continuity of care.  At the time of discharge, patient is not reporting any acute suicidal/homicidal ideations. She feels more confident about her self & mental health care. She currently denies any new issues or concerns. Education and supportive counseling provided throughout her hospital stay & upon discharge.   Today upon her discharge evaluation with the attending psychiatrist, Susy shares she is doing well. She denies any other specific concerns. She is sleeping well. Her appetite is good. She denies other physical complaints. She denies AH/VH. She feels that her medications have been helpful & is in agreement to continue her current treatment regimen as recommended. She was able to engage in safety planning including plan to return to [facility] or contact emergency services if she feels unable to maintain her own safety or the safety of others. Pt had no further questions, comments, or concerns. She left [facility] with all personal belongings in no apparent distress.  Transportation per her friend.  Physical Findings: AIMS: Facial and Oral Movements Muscles of Facial Expression: None, normal Lips and Perioral Area: None, normal Jaw: None, normal Tongue: None, normal,Extremity Movements Upper (arms, wrists, hands, fingers): None, normal Lower (legs, knees, ankles, toes): None, normal, Trunk Movements Neck, shoulders, hips: None, normal, Overall Severity Severity of abnormal movements (highest score from questions above): None, normal Incapacitation due to abnormal movements: None, normal Patient's awareness of abnormal movements (rate only patient's report): No Awareness, Dental Status Current problems with teeth and/or dentures?: No Does patient usually wear dentures?: No  CIWA:  CIWA-Ar Total: 5 COWS:  COWS Total Score: 4  Musculoskeletal: Strength & Muscle Tone: within normal limits Gait & Station: normal Patient leans: N/A  Psychiatric Specialty Exam: Physical Exam  Nursing note and vitals reviewed. Constitutional: She is oriented to person, place, and time. She appears well-developed and well-nourished.  Cardiovascular: Normal rate.  Respiratory: Effort normal.  GI: She exhibits no distension.  Genitourinary:    Genitourinary Comments: Deferred   Musculoskeletal:        General: Normal range of motion.     Cervical back: Normal range of motion.  Neurological: She is alert and oriented to person, place, and time.  Skin: Skin is warm and dry.    Review of Systems  Constitutional: Negative.  Negative for chills and diaphoresis.  Respiratory: Negative for cough, shortness of breath and wheezing.   Cardiovascular: Negative for chest pain and palpitations.  Gastrointestinal: Negative for diarrhea, nausea and vomiting.  Genitourinary: Negative for dysuria.  Musculoskeletal: Negative for myalgias.  Skin: Negative for itching and rash.  Neurological: Negative for dizziness and headaches.  Endo/Heme/Allergies:       Allergies:  Morphine, Baby oil Latex  Psychiatric/Behavioral: Positive for depression (Stabilized with medication prior to discharge ) and substance abuse ( Hx. Polysubstance use disorder (Stable)). Negative for hallucinations and suicidal ideas. The patient has insomnia (Stabilized with medication prior to discharge). The patient is not nervous/anxious (Stable).     Blood pressure 113/72, pulse 84, temperature 97.6 F (36.4 C), temperature source Oral, resp. rate 18, SpO2 100 %.There is no  height or weight on file to calculate BMI.  See MD's discharge SRA   Have you used any form of tobacco in the last 30 days? (Cigarettes, Smokeless Tobacco, Cigars, and/or Pipes): Yes  Has this patient used any form of tobacco in the last 30 days? (Cigarettes, Smokeless Tobacco, Cigars, and/or Pipes): Yes, an FDA-approved tobacco cessation medication was recommended at discharge.  Blood Alcohol level:  Lab Results  Component Value Date   ETH <10 10/28/2019   ETH <10 03/01/2019   Metabolic Disorder Labs:  Lab Results  Component Value Date   HGBA1C 5.0 10/29/2019   MPG 96.8 10/29/2019   No results found for: PROLACTIN No results found for: CHOL, TRIG, HDL, CHOLHDL, VLDL, LDLCALC  See Psychiatric Specialty Exam and Suicide Risk Assessment completed by Attending Physician prior to discharge.  Discharge destination:  Home  Is patient on multiple antipsychotic therapies at discharge:  No   Has Patient had three or more failed trials of antipsychotic monotherapy by history:  No  Recommended Plan for Multiple Antipsychotic Therapies: NA  Allergies as of 10/31/2019      Reactions   Latex Other (See Comments)   Yeast infection   Baby Oil Rash   Morphine And Related Rash      Medication List    STOP taking these medications   Claritin-D 12 Hour 5-120 MG tablet Generic drug: loratadine-pseudoephedrine   Melatonin 5 MG Tabs   QUEtiapine 50 MG tablet Commonly known as: SEROQUEL     TAKE these  medications     Indication  aspirin 81 MG EC tablet Take 1 tablet (81 mg total) by mouth daily. (May buy from over the counter)Swallow whole: For heart health. Start taking on: November 01, 2019  Indication: Heart health   busPIRone 10 MG tablet Commonly known as: BUSPAR Take 1 tablet (10 mg total) by mouth 3 (three) times daily. For anxiety What changed:   medication strength  how much to take  additional instructions  Indication: Anxiety Disorder   FLUoxetine 40 MG capsule Commonly known as: PROZAC Take 1 capsule (40 mg total) by mouth daily. For depression Start taking on: November 01, 2019 What changed:   medication strength  how much to take  additional instructions  Indication: Major Depressive Disorder   hydrOXYzine 25 MG tablet Commonly known as: ATARAX/VISTARIL Take 1 tablet (25 mg total) by mouth every 6 (six) hours as needed for itching. What changed:   medication strength  how much to take  when to take this  reasons to take this  Indication: Feeling Anxious   nicotine 14 mg/24hr patch Commonly known as: NICODERM CQ - dosed in mg/24 hours Place 1 patch (14 mg total) onto the skin daily. (May buy from over the counter): For smoking cessation Start taking on: November 01, 2019  Indication: Nicotine Addiction   pantoprazole 40 MG tablet Commonly known as: PROTONIX Take 1 tablet (40 mg total) by mouth daily. For acid reflux Start taking on: November 01, 2019  Indication: Gastroesophageal Reflux Disease   traZODone 50 MG tablet Commonly known as: DESYREL Take 1 tablet (50 mg total) by mouth at bedtime as needed for sleep.  Indication: Trouble Sleeping   valACYclovir 1000 MG tablet Commonly known as: VALTREX Take 1 tablet (1,000 mg total) by mouth daily. What changed: additional instructions  Indication: Herpes Simplex Infection      Follow-up Information    [facility] Follow up.   Contact information: [address] [place]  [id number]  [phone]          Follow-up recommendations: Activity:  As tolerated Diet: As recommended by your primary care doctor. Keep all scheduled follow-up appointments as recommended.  Comments: Prescriptions given at discharge.  Patient agreeable to plan.  Given opportunity to ask questions.  Appears to feel comfortable with discharge denies any current suicidal or homicidal thought. Patient is also instructed prior to discharge to: Take all medications as prescribed by his/her mental healthcare provider. Report any adverse effects and or reactions from the medicines to his/her outpatient provider promptly. Patient has been instructed & cautioned: To not engage in alcohol and or illegal drug use while on prescription medicines. In the event of worsening symptoms, patient is instructed to call the crisis hotline, 911 and or go to the nearest ED for appropriate evaluation and treatment of symptoms. To follow-up with his/her primary care provider for your other medical issues, concerns and or health care needs.  Signed: Lindell Spar, NP, PMHNP, FNP-BC 10/31/2019, 9:44 AM

## 2019-10-31 NOTE — Progress Notes (Signed)
D:  Patient denied SI and HI, contracts for safety.  Denied A/V hallucinations.  Denied pain. A:  Medications administered per MD orders.  Emotional support and encouragement given patient. R:  Safety maintained with 15 minute checks.  

## 2019-10-31 NOTE — Progress Notes (Addendum)
  [facility] Adult Case Management Discharge Plan :  Will you be returning to the same living situation after discharge:  No.  Will not be living with husband and children. At discharge, do you have transportation home?: Yes,  friend Do you have the ability to pay for your medications: No.  Will need assistance from [facility]  Release of information consent forms completed and emailed to Medical Records, then turned in to Medical Records by CSW.   Patient to Follow up at: Follow-up Information    [facility]. Call.   Why: Follow up with your current provider if desired.   Be sure to ask the provider you see for a referral to individual therapy and/or group therapy. Contact information: [address] [place] [address] [phone]          Follow-up Information    [facility]. Call.   Why: Call and/or present to their office to set up new services within no more than 7 days of hospital discharge. Contact information: [address] [place] [address] [phone]         Next level of care provider has access to [facility] Link:no  Safety Planning and Suicide Prevention discussed: Yes,  with husband  Have you used any form of tobacco in the last 30 days? (Cigarettes, Smokeless Tobacco, Cigars, and/or Pipes): Yes  Has patient been referred to the Quitline?: Yes, faxed on 10/31/19  Patient has been referred for addiction treatment: Yes  Lynnell Chad, LCSW 10/31/2019, 10:02 AM

## 2019-10-31 NOTE — [facility] Suicide Risk Assessment (Addendum)
[facility] Discharge Suicide Risk Assessment   Principal Problem: MDD (major depressive disorder) Discharge Diagnoses: Principal Problem:   MDD (major depressive disorder) Active Problems:   Depression   Major depression   Total Time spent with patient: 20 minutes  Musculoskeletal: Strength & Muscle Tone: within normal limits Gait & Station: normal Patient leans: N/A  Psychiatric Specialty Exam: Review of Systems  All other systems reviewed and are negative.   Blood pressure 113/72, pulse 84, temperature 97.6 F (36.4 C), temperature source Oral, resp. rate 18, SpO2 100 %.There is no height or weight on file to calculate BMI.  General Appearance: Casual  Eye Contact::  Fair  Speech:  Normal Rate409  Volume:  Normal  Mood:  Euthymic  Affect:  Congruent  Thought Process:  Coherent and Descriptions of Associations: Intact  Orientation:  Full (Time, Place, and Person)  Thought Content:  Logical  Suicidal Thoughts:  No  Homicidal Thoughts:  No  Memory:  Immediate;   Good Recent;   Good Remote;   Good  Judgement:  Intact  Insight:  Fair  Psychomotor Activity:  Normal  Concentration:  Good  Recall:  Good  Fund of Knowledge:Good  Language: Good  Akathisia:  Negative  Handed:  Right  AIMS (if indicated):     Assets:  Communication Skills Desire for Improvement Housing Resilience Social Support  Sleep:  Number of Hours: 4.25  Cognition: WNL  ADL's:  Intact   Mental Status Per Nursing Assessment::   On Admission:  Suicidal ideation indicated by patient  Demographic Factors:  Caucasian and Low socioeconomic status  Loss Factors: Loss of significant relationship and Financial problems/change in socioeconomic status  Historical Factors: Impulsivity  Risk Reduction Factors:   Sense of responsibility to family, Living with another person, especially a relative and Positive social support  Continued Clinical Symptoms:  Depression:   Comorbid alcohol  abuse/dependence Impulsivity Alcohol/Substance Abuse/Dependencies  Cognitive Features That Contribute To Risk:  None    Suicide Risk:  Minimal: No identifiable suicidal ideation.  Patients presenting with no risk factors but with morbid ruminations; may be classified as minimal risk based on the severity of the depressive symptoms  Follow-up Information    [facility] Follow up.   Contact information: [address] [place] [address] [phone]           Plan Of Care/Follow-up recommendations:  Activity:  ad lib Other:  Contact PCP about low TSH suggesting hyperthyroidism  Antonieta Pert, MD 10/31/2019, 9:26 AM

## 2019-10-31 NOTE — [facility] Group Notes (Signed)
[facility] LCSW Group Therapy Note  10/31/2019    Type of Therapy and Topic:  Group Therapy:  Adding Supports Including Yourself  Participation Level:  Active   Description of Group:   Patients in this group were introduced to the concept that additional supports including self-support are an essential part of recovery.  Patients listed what supports they believe they need to add to their lives to achieve their goals at discharge, and they listed such things as therapist, family, doctor, support groups, 12-step groups and service animals.   A song entitled "My Own Hero" was played and a group discussion ensued in which patients stated they could relate to the song and it inspired them to realize they have be willing to help themselves in order to succeed, because other people cannot achieve sobriety or stability for them.  "Fight For It" was played, then "I Am Enough" to encourage patients.  They discussed the impact on them and how they must remain convinced that their lives are worth the effort it takes to become sober and/or stable.  Therapeutic Goals: 1)  demonstrate the importance of being a key part of one's own support system 2)  discuss various available supports 3)  encourage patient to use music as part of their self-support and focus on goals 4)  elicit ideas from patients about supports that need to be added   Summary of Patient Progress:  The patient expressed that she understands her husband's support is now different because they are separated, but she needs him to be a friend to her and support her in that way now.  She also said she needs a therapist, and when she was told to mention this to her provider at [facility], she stated she now plans to go to [facility] instead.  CSW explained to her the process for starting with them.   Therapeutic Modalities:   Motivational Interviewing Activity  Sophia Garcia  8:51 AM

## 2019-10-31 NOTE — Progress Notes (Addendum)
Patient discharged home with family.  Denied SI and HI.  Denied A/V hallucinations.   Suicide prevention information given and discussed with patient who stated she understood and had no questions.  Patient stated she received all her belongings, clothing, toiletries, misc items, etc.  Patient stated she appreciated all assistance received from [facility] staff.  All required discharge information given to patient at discharge.

## 2019-11-10 ENCOUNTER — Encounter (HOSPITAL_COMMUNITY): Payer: Self-pay

## 2019-11-10 DIAGNOSIS — F321 Major depressive disorder, single episode, moderate: Secondary | ICD-10-CM

## 2019-11-10 DIAGNOSIS — F320 Major depressive disorder, single episode, mild: Secondary | ICD-10-CM

## 2019-11-10 NOTE — BH Assessment (Signed)
Q-Actual   Writer left a voice mail message on 10-31-2019 and 11-07-2019 to verify that the patient has added her Team Members to the app.

## 2019-11-10 NOTE — BH Assessment (Signed)
Q-Actual  PHQ 2 is 0 PHQ 9 is 4   Patient reports that she has loaded her daily reminders for her wellness checks in her app.   Patient has downloaded her Team Members to her app.

## 2019-12-08 ENCOUNTER — Encounter (HOSPITAL_COMMUNITY): Payer: Self-pay

## 2019-12-08 DIAGNOSIS — F321 Major depressive disorder, single episode, moderate: Secondary | ICD-10-CM

## 2019-12-08 NOTE — BH Assessment (Signed)
Q-actual 2-wk PHQ 2-9   PHQ 2 is 0  PHQ 9 is 1  Participant reports that she has started a new job and has forgot to complete his daily [employer].  Participant reports that she is going to change the reminder alarm on her app.   11-30-2019, was the last time that the Wellness Check was completed.

## 2019-12-28 ENCOUNTER — Encounter (HOSPITAL_COMMUNITY): Payer: Self-pay

## 2019-12-28 DIAGNOSIS — F321 Major depressive disorder, single episode, moderate: Secondary | ICD-10-CM

## 2019-12-28 NOTE — BH Assessment (Signed)
Q-actual 2-wk PHQ 2-9   PHQ 2 is 2  PHQ 9 is 1  Participant reports that she has been completing the wellness checks and her Team Members have been added but she has not needed to utilize the distress button for her support team to contact her.

## 2020-02-14 ENCOUNTER — Encounter (HOSPITAL_COMMUNITY): Payer: Self-pay

## 2020-02-14 DIAGNOSIS — F321 Major depressive disorder, single episode, moderate: Secondary | ICD-10-CM

## 2020-02-14 NOTE — BH Assessment (Signed)
Q-Actual   Writer attempted to contact the participant in order to complete a PHQ 2-9.  Writer was unsuccessful in reaching the patient.   Last Wellness check completed on 01-03-2020

## 2020-04-04 ENCOUNTER — Encounter (HOSPITAL_COMMUNITY): Payer: Self-pay | Admitting: General Practice

## 2020-04-04 DIAGNOSIS — F320 Major depressive disorder, single episode, mild: Secondary | ICD-10-CM

## 2020-04-04 NOTE — BH Assessment (Signed)
Q-Actual   Writer attempted to contact the participant in order to complete a PHQ 2-9.  Writer was unsuccessful in reaching the patient.  

## 2020-04-23 ENCOUNTER — Encounter (HOSPITAL_COMMUNITY): Payer: Self-pay

## 2020-04-23 ENCOUNTER — Other Ambulatory Visit: Payer: Self-pay

## 2020-04-23 DIAGNOSIS — R197 Diarrhea, unspecified: Secondary | ICD-10-CM | POA: Diagnosis not present

## 2020-04-23 DIAGNOSIS — R0981 Nasal congestion: Secondary | ICD-10-CM | POA: Insufficient documentation

## 2020-04-23 DIAGNOSIS — Z5321 Procedure and treatment not carried out due to patient leaving prior to being seen by health care provider: Secondary | ICD-10-CM | POA: Insufficient documentation

## 2020-04-23 DIAGNOSIS — R05 Cough: Secondary | ICD-10-CM | POA: Diagnosis not present

## 2020-04-23 DIAGNOSIS — Z20822 Contact with and (suspected) exposure to covid-19: Secondary | ICD-10-CM | POA: Diagnosis not present

## 2020-04-23 LAB — SARS CORONAVIRUS 2 BY RT PCR (HOSPITAL ORDER, PERFORMED IN ~~LOC~~ HOSPITAL LAB): SARS Coronavirus 2: NEGATIVE

## 2020-04-23 NOTE — ED Triage Notes (Signed)
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  Pt states that she is here because she is worried that she has covid and wants to be tested.  Denies getting the vaccine.

## 2020-04-24 ENCOUNTER — Emergency Department (HOSPITAL_COMMUNITY)
Admission: EM | Admit: 2020-04-24 | Discharge: 2020-04-24 | Disposition: A | Discharge status: Home / Self Care | Payer: HRSA Program | Attending: Emergency Medicine | Admitting: Emergency Medicine

## 2021-04-08 ENCOUNTER — Other Ambulatory Visit: Payer: Self-pay

## 2021-04-08 ENCOUNTER — Encounter (HOSPITAL_COMMUNITY): Payer: Self-pay | Admitting: Emergency Medicine

## 2021-04-08 ENCOUNTER — Emergency Department (HOSPITAL_COMMUNITY): Payer: Self-pay

## 2021-04-08 ENCOUNTER — Emergency Department (HOSPITAL_COMMUNITY)
Admission: EM | Admit: 2021-04-08 | Discharge: 2021-04-08 | Disposition: A | Discharge status: Home / Self Care | Payer: Self-pay | Attending: Emergency Medicine | Admitting: Emergency Medicine

## 2021-04-08 DIAGNOSIS — M533 Sacrococcygeal disorders, not elsewhere classified: Secondary | ICD-10-CM | POA: Insufficient documentation

## 2021-04-08 DIAGNOSIS — Z91040 Latex allergy status: Secondary | ICD-10-CM | POA: Insufficient documentation

## 2021-04-08 DIAGNOSIS — F17210 Nicotine dependence, cigarettes, uncomplicated: Secondary | ICD-10-CM | POA: Insufficient documentation

## 2021-04-08 DIAGNOSIS — Z7982 Long term (current) use of aspirin: Secondary | ICD-10-CM | POA: Insufficient documentation

## 2021-04-08 LAB — POC URINE PREG, ED: Preg Test, Ur: NEGATIVE
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
## 2021-04-08 NOTE — Discharge Instructions (Signed)
Imaging was reassuring.  I recommend over-the-counter pain medications.  You also go to the store or [employer] and buy a donut to sit on this will help relieve some your pain.  Follow-up with your PCP as needed.  Come back to the emergency department if you develop chest pain, shortness of breath, severe abdominal pain, uncontrolled nausea, vomiting, diarrhea.

## 2023-09-03 DIAGNOSIS — D75839 Thrombocytosis, unspecified: Secondary | ICD-10-CM | POA: Insufficient documentation

## 2023-09-04 ENCOUNTER — Inpatient Hospital Stay: Payer: MEDICAID | Admitting: Hematology

## 2023-09-04 ENCOUNTER — Inpatient Hospital Stay: Payer: MEDICAID | Attending: Hematology | Admitting: Hematology

## 2023-09-04 ENCOUNTER — Other Ambulatory Visit: Payer: Self-pay | Admitting: *Deleted

## 2023-09-04 VITALS — BP 114/80 | HR 78 | Temp 98.0°F | Resp 18 | Ht 59.0 in | Wt 116.0 lb

## 2023-09-04 DIAGNOSIS — D75839 Thrombocytosis, unspecified: Secondary | ICD-10-CM

## 2023-09-04 DIAGNOSIS — D649 Anemia, unspecified: Secondary | ICD-10-CM | POA: Insufficient documentation

## 2023-09-04 DIAGNOSIS — F17210 Nicotine dependence, cigarettes, uncomplicated: Secondary | ICD-10-CM | POA: Diagnosis not present

## 2023-09-04 LAB — C-REACTIVE PROTEIN: CRP: 0.7 mg/dL (ref <1.0)

## 2023-09-04 LAB — FERRITIN: Ferritin: 4 ng/mL — ABNORMAL LOW (ref 11–307)

## 2023-09-04 LAB — CBC WITH DIFFERENTIAL/PLATELET
Abs Immature Granulocytes: 0.03 10*3/uL (ref 0.00–0.07)
Basophils Absolute: 0.1 10*3/uL (ref 0.0–0.1)
Basophils Relative: 1 %
Eosinophils Absolute: 0.4 10*3/uL (ref 0.0–0.5)
Eosinophils Relative: 6 %
HCT: 32.2 % — ABNORMAL LOW (ref 36.0–46.0)
Hemoglobin: 9.5 g/dL — ABNORMAL LOW (ref 12.0–15.0)
Immature Granulocytes: 0 %
Lymphocytes Relative: 29 %
Lymphs Abs: 1.9 10*3/uL (ref 0.7–4.0)
MCH: 23 pg — ABNORMAL LOW (ref 26.0–34.0)
MCHC: 29.5 g/dL — ABNORMAL LOW (ref 30.0–36.0)
MCV: 78 fL — ABNORMAL LOW (ref 80.0–100.0)
Monocytes Absolute: 0.5 10*3/uL (ref 0.1–1.0)
Monocytes Relative: 8 %
Neutro Abs: 3.8 10*3/uL (ref 1.7–7.7)
Neutrophils Relative %: 56 %
Platelets: 546 10*3/uL — ABNORMAL HIGH (ref 150–400)
RBC: 4.13 MIL/uL (ref 3.87–5.11)
RDW: 19.4 % — ABNORMAL HIGH (ref 11.5–15.5)
WBC: 6.7 10*3/uL (ref 4.0–10.5)
nRBC: 0 % (ref 0.0–0.2)

## 2023-09-04 LAB — SEDIMENTATION RATE: Sed Rate: 7 mm/h (ref 0–22)

## 2023-09-04 LAB — IRON AND TIBC
Iron: 19 ug/dL — ABNORMAL LOW (ref 28–170)
Saturation Ratios: 4 % — ABNORMAL LOW (ref 10.4–31.8)
TIBC: 501 ug/dL — ABNORMAL HIGH (ref 250–450)
UIBC: 482 ug/dL

## 2023-09-04 LAB — LACTATE DEHYDROGENASE: LDH: 130 U/L (ref 98–192)

## 2023-09-04 NOTE — Patient Instructions (Signed)
You were seen and examined today by Dr. Ellin Saba. Dr. Ellin Saba is a hematologist, meaning that he specializes in blood abnormalities. Dr. Ellin Saba discussed your past medical history, family history of cancers/blood conditions and the events that led to you being here today.  You were referred to Dr. Ellin Saba due to thrombocytosis (elevated platelets).  Dr. Ellin Saba has recommended additional labs today for further evaluation.  Follow-up as scheduled.

## 2023-09-04 NOTE — Progress Notes (Signed)
Per Dr. Ellin Saba, patient made aware of low iron levels.  Advised to start iron 325 mg daily.  Made her aware if she could not tolerate to let us know and we would schedule for IV iron.  Verbalized understanding.

## 2023-09-04 NOTE — Progress Notes (Signed)
[facility] [address] [place], [place] [address]   Clinic Day:  09/04/2023  Referring physician: Micki Riley, Harlin Rain, *  Patient Care Team: Patient, No Pcp Per as PCP - General (General Practice)   ASSESSMENT & PLAN:   Assessment:  1.  Elevated platelet count: - She was seen for intermittent elevated platelet count since 2018, ranging between 412-546. - Denies aqua genic pruritus/erythromelalgia/thrombosis. - Denies any B symptoms or recurrent infections.  She is a current active smoker.  2. Social/Family History: -She has 2 cats. Tobacco use of 6 cigarettes a day, prior use of 1.5 ppd for 7 years since age 67. She is attempting to quit smoking. -No family history of MPN, lupus, or rheumatoid arthritis. Maternal grandmother died of breast cancer metastasized to bones. Paternal grandmother had breast cancer. Paternal aunt had lung cancer. Another paternal aunt had breast cancer Father died of pancreatic cancer.  Plan:  1.  Elevated platelet count: - We discussed reactive causes as well as clonal causes of thrombocytosis. - Will repeat platelet count today, check ferritin and iron panel.  Will also check ESR, CRP, ANA, rheumatoid factor. - Will check for myeloproliferative neoplasms with BCR/ABL by FISH and JAK2 V6 1 7 mutation with reflex testing. - RTC 4 weeks to discuss results and further plan.   Orders Placed This Encounter  Procedures   CBC with Differential    Standing Status:   Future    Number of Occurrences:   1    Expected Date:   09/11/2023    Expiration Date:   09/03/2024   Iron and TIBC ([facility] ONLY)    Standing Status:   Future    Number of Occurrences:   1    Expected Date:   09/11/2023    Expiration Date:   09/03/2024   Ferritin    Standing Status:   Future    Number of Occurrences:   1    Expected Date:   09/11/2023    Expiration Date:   09/03/2024   JAK2 V617F rfx CALR/MPL/E12-15    Standing Status:   Future     Number of Occurrences:   1    Expected Date:   09/11/2023    Expiration Date:   09/03/2024   Rheumatoid factor    Standing Status:   Future    Number of Occurrences:   1    Expected Date:   09/11/2023    Expiration Date:   09/03/2024   C-reactive protein    Standing Status:   Future    Number of Occurrences:   1    Expected Date:   09/11/2023    Expiration Date:   09/03/2024   Lactate dehydrogenase    Standing Status:   Future    Number of Occurrences:   1    Expected Date:   09/11/2023    Expiration Date:   09/03/2024   ANA, IFA (with reflex)    Standing Status:   Future    Number of Occurrences:   1    Expected Date:   09/11/2023    Expiration Date:   09/03/2024   Sedimentation rate    Standing Status:   Future    Number of Occurrences:   1    Expected Date:   09/11/2023    Expiration Date:   09/03/2024   BCR-ABL1 FISH    Standing Status:   Future    Number of Occurrences:   1  Expected Date:   09/11/2023    Expiration Date:   09/03/2024      Alben Deeds Teague,acting as a scribe for Doreatha Massed, MD.,have documented all relevant documentation on the behalf of Doreatha Massed, MD,as directed by  Doreatha Massed, MD while in the presence of Doreatha Massed, MD.   I, Doreatha Massed MD, have reviewed the above documentation for accuracy and completeness, and I agree with the above.   Doreatha Massed, MD   1/16/20258:57 AM  CHIEF COMPLAINT/PURPOSE OF CONSULT:   Diagnosis: Elevated platelet count  Current Therapy: Under workup  HISTORY OF PRESENT ILLNESS:   Sophia Garcia is a 37 y.o. female presenting to clinic today for evaluation of thrombocytosis at the request of Saundra Shelling, NP.  Today, she states that she is doing well overall. Her appetite level is at 85%. Her energy level is at 80%.  She was found to have abnormal CBC from 08/11/23 with low HGB at 10.2, low HCT at 32.6, low MCV at 80, low MCH at 25, elevated platelets at 640, and  elevated eosinophils at 5.4.   She has known about her thrombocytosis for 3 months since her doctor told her. She denies any history of blood clots and has never taken iron pills. 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 She notes easily bruising her whole life and states she is anemic. She reports when she was 1 she had low iron or blood that was resolved with eating leafy vegetables. She denies any erythromelalgia, skin rashes, or joint swellings. She denies any fevers, nights sweats, or unintentional weight loss.  She notes 6-7 years ago she had recurrent yeast infections that have since resolved and denies any recent recurrent infections.  She reports she has 2 cats at home. She denies any recent vaccines  PAST MEDICAL HISTORY:   Past Medical History: Past Medical History:  Diagnosis Date   BV (bacterial vaginosis)    Genital herpes    HPV (human papilloma virus) infection    Normal pregnancy, incidental 12/23/2011   Preterm labor    Yeast infection     Surgical History: Past Surgical History:  Procedure Laterality Date   CESAREAN SECTION     CESAREAN SECTION  06/12/2012   Procedure: CESAREAN SECTION;  Surgeon: Kirkland Hun, MD;  Location: [facility];  Service: Obstetrics;  Laterality: N/A;  Repeat   CHOLECYSTECTOMY N/A 01/03/2014   Procedure: LAPAROSCOPIC CHOLECYSTECTOMY;  Surgeon: Dalia Heading, MD;  Location: [facility];  Service: General;  Laterality: N/A;   FOOT SURGERY  2002   STEPPED ON SEWING NEEDLE   TOOTH EXTRACTION  2009   ONE TOOTH   TUBAL LIGATION      Social History: Social History   Socioeconomic History   Marital status: Married    Spouse name: Not on file   Number of children: Not on file   Years of education: 9   Highest education level: Not on file  Occupational History   Not on file  Tobacco Use   Smoking status: Every Day    Current packs/day: 0.25    Average packs/day: 0.3 packs/day for 12.0 years (3.0 ttl pk-yrs)    Types: Cigarettes   Smokeless  tobacco: Never   Tobacco comments:    1 pack lasts 4 days  Vaping Use   Vaping status: Never Used  Substance and Sexual Activity   Alcohol use: Yes    Comment: occasionally   Drug use: Not Currently    Types: Marijuana   Sexual activity:  Yes    Partners: Male    Birth control/protection: Surgical    Comment: BTL   Other Topics Concern   Not on file  Social History Narrative   Not on file   Social Drivers of Health   Financial Resource Strain: Unknown (03/01/2019)   Overall Financial Resource Strain (CARDIA)    Difficulty of Paying Living Expenses: Patient declined  Food Insecurity: Unknown (03/01/2019)   Hunger Vital Sign    Worried About Running Out of Food in the Last Year: Patient declined    Ran Out of Food in the Last Year: Patient declined  Transportation Needs: Unknown (03/01/2019)   PRAPARE - Administrator, Civil Service (Medical): Patient declined    Lack of Transportation (Non-Medical): Patient declined  Physical Activity: Unknown (03/01/2019)   Exercise Vital Sign    Days of Exercise per Week: Patient declined    Minutes of Exercise per Session: Patient declined  Stress: Stress Concern Present (03/01/2019)   [employer] of Occupational Health - Occupational Stress Questionnaire    Feeling of Stress : Very much  Social Connections: Unknown (03/01/2019)   Social Connection and Isolation Panel [NHANES]    Frequency of Communication with Friends and Family: Patient declined    Frequency of Social Gatherings with Friends and Family: Patient declined    Attends Religious Services: Patient declined    Database administrator or Organizations: Patient declined    Attends Banker Meetings: Patient declined    Marital Status: Patient declined  Intimate Partner Violence: Unknown (03/01/2019)   Humiliation, Afraid, Rape, and Kick questionnaire    Fear of Current or Ex-Partner: Patient declined    Emotionally Abused: Patient declined     Physically Abused: Patient declined    Sexually Abused: Patient declined    Family History: Family History  Problem Relation Age of Onset   Diabetes Father        ORAL MEDS   Cancer Paternal Aunt        BREAST;LIVER   Diabetes Paternal Aunt    Diabetes Paternal Uncle    COPD Paternal Uncle    Cancer Maternal Grandmother        BREAST   COPD Mother        EMPHYSEMA   Diabetes Paternal Grandfather    Other Neg Hx     Current Medications:  Current Outpatient Medications:    aspirin 81 MG EC tablet, Take 1 tablet (81 mg total) by mouth daily. (May buy from over the counter)Swallow whole: For heart health., Disp: 1 tablet, Rfl: 0   busPIRone (BUSPAR) 10 MG tablet, Take 1 tablet (10 mg total) by mouth 3 (three) times daily. For anxiety, Disp: 90 tablet, Rfl: 0   DULoxetine (CYMBALTA) 20 MG capsule, Take 20 mg by mouth 2 (two) times daily., Disp: , Rfl:    FLUoxetine (PROZAC) 40 MG capsule, Take 1 capsule (40 mg total) by mouth daily. For depression, Disp: 30 capsule, Rfl: 0   hydrOXYzine (ATARAX/VISTARIL) 25 MG tablet, Take 1 tablet (25 mg total) by mouth every 6 (six) hours as needed for itching., Disp: 75 tablet, Rfl: 0   nicotine (NICODERM CQ - DOSED IN MG/24 HOURS) 14 mg/24hr patch, Place 1 patch (14 mg total) onto the skin daily. (May buy from over the counter): For smoking cessation, Disp: 1 patch, Rfl: 0   pantoprazole (PROTONIX) 40 MG tablet, Take 1 tablet (40 mg total) by mouth daily. For acid reflux, Disp: 15 tablet, Rfl:  0   QUEtiapine (SEROQUEL) 25 MG tablet, Take 25 mg by mouth at bedtime., Disp: , Rfl:    valACYclovir (VALTREX) 1000 MG tablet, Take 1 tablet (1,000 mg total) by mouth daily. (Patient taking differently: Take 1,000 mg by mouth daily. May take 1,000mg  twice daily as needed for outbreak.), Disp: 90 tablet, Rfl: 1   Vitamin D, Ergocalciferol, (DRISDOL) 1.25 MG (50000 UNIT) CAPS capsule, Take 50,000 Units by mouth once a week., Disp: , Rfl:     Allergies: Allergies  Allergen Reactions   Latex Other (See Comments)    Yeast infection   Baby Oil Rash   Morphine And Codeine Rash    REVIEW OF SYSTEMS:   Review of Systems  Constitutional:  Negative for chills, fatigue and fever.  HENT:   Negative for lump/mass, mouth sores, nosebleeds, sore throat and trouble swallowing.   Eyes:  Negative for eye problems.  Respiratory:  Positive for cough. Negative for shortness of breath.   Cardiovascular:  Negative for chest pain, leg swelling and palpitations.  Gastrointestinal:  Negative for abdominal pain, constipation, diarrhea, nausea and vomiting.  Genitourinary:  Negative for bladder incontinence, difficulty urinating, dysuria, frequency, hematuria and nocturia.   Musculoskeletal:  Negative for arthralgias, back pain, flank pain, myalgias and neck pain.  Skin:  Negative for itching and rash.  Neurological:  Positive for headaches. Negative for dizziness and numbness.  Hematological:  Bruises/bleeds easily.  Psychiatric/Behavioral:  Positive for depression. Negative for sleep disturbance and suicidal ideas. The patient is not nervous/anxious.   All other systems reviewed and are negative.    VITALS:   Blood pressure 114/80, pulse 78, temperature 98 F (36.7 C), temperature source Oral, resp. rate 18, height 4\' 11"  (1.499 m), weight 116 lb (52.6 kg), SpO2 100%.  Wt Readings from Last 3 Encounters:  09/04/23 116 lb (52.6 kg)  04/08/21 115 lb (52.2 kg)  04/23/20 125 lb (56.7 kg)    Body mass index is 23.43 kg/m.   PHYSICAL EXAM:   Physical Exam Vitals and nursing note reviewed. Exam conducted with a chaperone present.  Constitutional:      Appearance: Normal appearance.  Cardiovascular:     Rate and Rhythm: Normal rate and regular rhythm.     Pulses: Normal pulses.     Heart sounds: Normal heart sounds.  Pulmonary:     Effort: Pulmonary effort is normal.     Breath sounds: Normal breath sounds.  Abdominal:      Palpations: Abdomen is soft. There is no hepatomegaly, splenomegaly or mass.     Tenderness: There is no abdominal tenderness.  Musculoskeletal:     Right lower leg: No edema.     Left lower leg: No edema.  Lymphadenopathy:     Cervical: No cervical adenopathy.     Right cervical: No superficial, deep or posterior cervical adenopathy.    Left cervical: No superficial, deep or posterior cervical adenopathy.     Upper Body:     Right upper body: Axillary adenopathy present. No supraclavicular adenopathy.     Left upper body: No supraclavicular or axillary adenopathy.  Neurological:     General: No focal deficit present.     Mental Status: She is alert and oriented to person, place, and time.  Psychiatric:        Mood and Affect: Mood normal.        Behavior: Behavior normal.     LABS:   CBC    Component Value Date/Time   WBC 7.8  10/28/2019 1833   RBC 4.22 10/28/2019 1833   HGB 12.5 10/28/2019 1833   HCT 38.7 10/28/2019 1833   PLT 546 (H) 10/28/2019 1833   MCV 91.7 10/28/2019 1833   MCH 29.6 10/28/2019 1833   MCHC 32.3 10/28/2019 1833   RDW 14.6 10/28/2019 1833   LYMPHSABS 1.9 10/28/2019 1833   MONOABS 0.5 10/28/2019 1833   EOSABS 0.2 10/28/2019 1833   BASOSABS 0.0 10/28/2019 1833    CMP    Component Value Date/Time   NA 140 10/28/2019 1833   K 3.5 10/28/2019 1833   CL 107 10/28/2019 1833   CO2 25 10/28/2019 1833   GLUCOSE 127 (H) 10/28/2019 1833   BUN 15 10/28/2019 1833   CREATININE 0.56 10/28/2019 1833   CALCIUM 9.4 10/28/2019 1833   PROT 7.3 10/28/2019 1833   ALBUMIN 4.3 10/28/2019 1833   AST 18 10/28/2019 1833   ALT 15 10/28/2019 1833   ALKPHOS 24 (L) 10/28/2019 1833   BILITOT 1.0 10/28/2019 1833   GFRNONAA >60 10/28/2019 1833   GFRAA >60 10/28/2019 1833    No results found for: "CEA1", "CEA" / No results found for: "CEA1", "CEA" No results found for: "PSA1" No results found for: "ZOX096" No results found for: "CAN125"  No results found for:  "TOTALPROTELP", "ALBUMINELP", "A1GS", "A2GS", "BETS", "BETA2SER", "GAMS", "MSPIKE", "SPEI" No results found for: "TIBC", "FERRITIN", "IRONPCTSAT" No results found for: "LDH"   STUDIES:   No results found.

## 2023-09-05 LAB — RHEUMATOID FACTOR: Rheumatoid fact SerPl-aCnc: <10 [IU]/mL (ref <14.0)

## 2023-09-07 LAB — ANTINUCLEAR ANTIBODIES, IFA: ANA Ab, IFA: NEGATIVE

## 2023-09-10 LAB — JAK2 V617F RFX CALR/MPL/E12-15

## 2023-09-10 LAB — CALR +MPL + E12-E15  (REFLEX)

## 2023-09-10 LAB — BCR-ABL1 FISH
Cells Analyzed: 200
Cells Counted: 200

## 2023-09-16 ENCOUNTER — Inpatient Hospital Stay: Payer: MEDICAID

## 2023-09-16 ENCOUNTER — Inpatient Hospital Stay: Payer: MEDICAID | Admitting: Oncology

## 2023-10-06 ENCOUNTER — Other Ambulatory Visit: Payer: Self-pay | Admitting: *Deleted

## 2023-10-07 NOTE — Progress Notes (Unsigned)
Virtual Visit via Telephone Note  I connected with Sophia Garcia on 10/07/23 at  8:30 AM EST by telephone and verified that I am speaking with the correct person using two identifiers.  Location: Patient: Home  Provider: Home Office   I discussed the limitations, risks, security and privacy concerns of performing an evaluation and management service by telephone and the availability of in person appointments. I also discussed with the patient that there may be a patient responsible charge related to this service. The patient expressed understanding and agreed to proceed.   History of Present Illness: Sophia Garcia is a 38 year old female who was evaluated by Dr. Ellin Saba on 09/04/2023 for elevated platelet counts.  Counts have ranged between 412 and 546 since 2018.  She denies any B symptoms, recurrent infections, aquagenic pruritus/ erythromelalgia/ thrombosis.  She is a current everyday smoker.  She is here to discuss her lab results.  She is currently taking iron supplements 325 mg ferrous sulfate as instructed on 09/04/2023 after her labs resulted in our clinic. She denies any bleeding, hematochezia, melena or bright red blood per rectum.  Her menstrual cycles are heavy in the beginning and last approximately 4-5 days.  Reports she was told she was iron deficient as a child.  Remembers taking iron supplements when she was younger.  Having some constipation and her stools are dark.  Reports feeling slightly better and less groggy.  Energy has improved some.  Her appetite is 100%.  Reports she has fairly frequent headaches.  Reports her grandfather died of a brain aneurysm so she always gets nervous when she has headaches.    Observations/Objective:Review of Systems  Gastrointestinal:  Positive for constipation.  Neurological:  Positive for tingling and headaches.    Physical Exam Neurological:     Mental Status: She is alert and oriented to person, place, and time.     Assessment and  Plan: 1. Thrombocytosis (Primary) -Likely reactive secondary to iron deficiency. -Labs from 09/04/2023 showed a platelet count of 546, hemoglobin 9.5 and MCV 78.0.  Iron saturations 4%, TIBC 501 and ferritin was 4.  She is currently being ferrous sulfate 325 mg tablets with mild constipation.  JAK2 testing with reflex was negative.  BCR able FISH testing negative.  No evidence of underlying inflammation or autoimmune disorder. -If no improvement with oral iron, recommend IV iron. -We discussed rechecking labs in approximately 4 to 6 weeks with follow-up telephone call.    Follow Up Instructions RTC in 4 to 6 weeks with labs and telephone call.   I discussed the assessment and treatment plan with the patient. The patient was provided an opportunity to ask questions and all were answered. The patient agreed with the plan and demonstrated an understanding of the instructions.   The patient was advised to call back or seek an in-person evaluation if the symptoms worsen or if the condition fails to improve as anticipated.  I provided 17 minutes of non-face-to-face time during this encounter.   Mauro Kaufmann, NP

## 2023-10-08 ENCOUNTER — Inpatient Hospital Stay: Payer: MEDICAID | Attending: Oncology | Admitting: Oncology

## 2023-10-08 DIAGNOSIS — D75839 Thrombocytosis, unspecified: Secondary | ICD-10-CM

## 2023-11-17 ENCOUNTER — Inpatient Hospital Stay: Payer: MEDICAID | Attending: Oncology

## 2023-11-17 DIAGNOSIS — D75839 Thrombocytosis, unspecified: Secondary | ICD-10-CM | POA: Insufficient documentation

## 2023-11-17 LAB — CBC WITH DIFFERENTIAL/PLATELET
Abs Immature Granulocytes: 0.03 10*3/uL (ref 0.00–0.07)
Basophils Absolute: 0.1 10*3/uL (ref 0.0–0.1)
Basophils Relative: 1 %
Eosinophils Absolute: 0.5 10*3/uL (ref 0.0–0.5)
Eosinophils Relative: 6 %
HCT: 33.9 % — ABNORMAL LOW (ref 36.0–46.0)
Hemoglobin: 10.7 g/dL — ABNORMAL LOW (ref 12.0–15.0)
Immature Granulocytes: 0 %
Lymphocytes Relative: 24 %
Lymphs Abs: 1.9 10*3/uL (ref 0.7–4.0)
MCH: 26.2 pg (ref 26.0–34.0)
MCHC: 31.6 g/dL (ref 30.0–36.0)
MCV: 82.9 fL (ref 80.0–100.0)
Monocytes Absolute: 0.5 10*3/uL (ref 0.1–1.0)
Monocytes Relative: 6 %
Neutro Abs: 5.1 10*3/uL (ref 1.7–7.7)
Neutrophils Relative %: 63 %
Platelets: 550 10*3/uL — ABNORMAL HIGH (ref 150–400)
RBC: 4.09 MIL/uL (ref 3.87–5.11)
RDW: 20.2 % — ABNORMAL HIGH (ref 11.5–15.5)
WBC: 8 10*3/uL (ref 4.0–10.5)
nRBC: 0 % (ref 0.0–0.2)

## 2023-11-17 LAB — IRON AND TIBC
Iron: 22 ug/dL — ABNORMAL LOW (ref 28–170)
Saturation Ratios: 5 % — ABNORMAL LOW (ref 10.4–31.8)
TIBC: 463 ug/dL — ABNORMAL HIGH (ref 250–450)
UIBC: 441 ug/dL

## 2023-11-17 LAB — FERRITIN: Ferritin: 4 ng/mL — ABNORMAL LOW (ref 11–307)

## 2023-11-21 ENCOUNTER — Inpatient Hospital Stay: Payer: MEDICAID | Attending: Oncology | Admitting: Oncology

## 2023-11-21 DIAGNOSIS — D500 Iron deficiency anemia secondary to blood loss (chronic): Secondary | ICD-10-CM

## 2023-11-21 DIAGNOSIS — E611 Iron deficiency: Secondary | ICD-10-CM | POA: Insufficient documentation

## 2023-11-21 DIAGNOSIS — D75839 Thrombocytosis, unspecified: Secondary | ICD-10-CM | POA: Diagnosis not present

## 2023-11-21 DIAGNOSIS — D509 Iron deficiency anemia, unspecified: Secondary | ICD-10-CM | POA: Insufficient documentation

## 2023-11-21 NOTE — Progress Notes (Signed)
 Virtual Visit via Telephone Note  I connected with Sophia Garcia on 11/21/23 at  1:00 PM EDT by telephone and verified that I am speaking with the correct person using two identifiers.  Location: Patient: Home Provider: Clinic    I discussed the limitations, risks, security and privacy concerns of performing an evaluation and management service by telephone and the availability of in person appointments. I also discussed with the patient that there may be a patient responsible charge related to this service. The patient expressed understanding and agreed to proceed.   History of Present Illness: Patient is a 38 year old female who was last seen in clinic on 10/08/23 by me after she was initially worked up by Dr. Ellin Saba for elevated platelet counts.  Her counts have ranged from 412-546 since 2018.  She continues to deny any B symptoms, recurrent infections, aqua genic pruritus/erythromelalgia/thrombus.  She is a current everyday smoker.  She was started on oral iron on 09/04/2023.  Reports heavy menstrual cycles that last approximately 4 to 5 days.  Reports iron tablets do cause constipation but she is able to take OTC laxatives which have been helpful.  Reports fatigue is more or less stable.  Denies any bleeding that she is aware of.    Observations/Objective:Review of Systems  Respiratory:  Positive for cough.   Cardiovascular:  Positive for chest pain.  Gastrointestinal:  Positive for constipation.  Neurological:  Positive for dizziness, sensory change and headaches.  Psychiatric/Behavioral:  The patient has insomnia.    Physical Exam Neurological:     Mental Status: She is alert and oriented to person, place, and time.     Assessment and Plan: 1. Thrombocytosis (Primary) -Etiology felt to be reactive from iron deficiency. -She is currently on 25 mg ferrous sulfate tablets daily since mid January 2025 although it does cause constipation. -Labs from 11/17/2023 show hemoglobin of  10.7 (9.5), hematocrit 33.9 with iron saturation is 5% and TIBC 463.  These have slightly improved since January.  Ferritin is still 4. -We discussed trying 2 doses of IV Feraheme 510 mg given about a week apart and follow-up in approximately 3 months with labs a few days before.  This can be a telephone call if she would prefer. -For now she can stop taking iron tablets.  Follow Up Instructions: Return to clinic in 3 months with labs a few days before.    I discussed the assessment and treatment plan with the patient. The patient was provided an opportunity to ask questions and all were answered. The patient agreed with the plan and demonstrated an understanding of the instructions.   The patient was advised to call back or seek an in-person evaluation if the symptoms worsen or if the condition fails to improve as anticipated.  I provided 21 minutes of non-face-to-face time during this encounter.   Mauro Kaufmann, NP

## 2023-11-26 ENCOUNTER — Inpatient Hospital Stay: Payer: MEDICAID

## 2023-11-26 VITALS — BP 106/65 | HR 73 | Temp 98.5°F | Resp 16

## 2023-11-26 DIAGNOSIS — D75839 Thrombocytosis, unspecified: Secondary | ICD-10-CM | POA: Diagnosis present

## 2023-11-26 DIAGNOSIS — D500 Iron deficiency anemia secondary to blood loss (chronic): Secondary | ICD-10-CM

## 2023-11-26 DIAGNOSIS — E611 Iron deficiency: Secondary | ICD-10-CM | POA: Diagnosis present

## 2023-11-26 MED ORDER — CETIRIZINE HCL 10 MG PO TABS
10.0000 mg | ORAL_TABLET | Freq: Once | ORAL | Status: AC
  Administered 2023-11-26: 10 mg via ORAL
  Filled 2023-11-26: qty 1

## 2023-11-26 MED ORDER — SODIUM CHLORIDE 0.9 % IV SOLN
510.0000 mg | Freq: Once | INTRAVENOUS | Status: AC
  Administered 2023-11-26: 510 mg via INTRAVENOUS
  Filled 2023-11-26: qty 510

## 2023-11-26 MED ORDER — ACETAMINOPHEN 325 MG PO TABS
650.0000 mg | ORAL_TABLET | Freq: Once | ORAL | Status: AC
  Administered 2023-11-26: 650 mg via ORAL
  Filled 2023-11-26: qty 2

## 2023-11-26 MED ORDER — SODIUM CHLORIDE 0.9 % IV SOLN: INTRAVENOUS | Status: DC

## 2023-11-26 NOTE — Progress Notes (Signed)
 Patient presents today for Feraheme infusion per providers order.  Vital signs WNL.  Patient has no new complaints at this time.  Peripheral IV started and blood return noted pre and post infusion.  Stable during infusion without adverse affects.  Vital signs stable.  No complaints at this time.  Discharge from clinic ambulatory in stable condition.  Alert and oriented X 3.  Follow up with [facility] as scheduled.

## 2023-11-26 NOTE — Patient Instructions (Signed)

## 2023-12-03 ENCOUNTER — Inpatient Hospital Stay: Payer: MEDICAID

## 2023-12-03 VITALS — BP 134/79 | HR 65 | Temp 98.0°F | Resp 16

## 2023-12-03 DIAGNOSIS — E611 Iron deficiency: Secondary | ICD-10-CM | POA: Diagnosis not present

## 2023-12-03 DIAGNOSIS — D500 Iron deficiency anemia secondary to blood loss (chronic): Secondary | ICD-10-CM

## 2023-12-03 MED ORDER — CETIRIZINE HCL 10 MG PO TABS
10.0000 mg | ORAL_TABLET | Freq: Once | ORAL | Status: AC
  Administered 2023-12-03: 10 mg via ORAL
  Filled 2023-12-03: qty 1

## 2023-12-03 MED ORDER — SODIUM CHLORIDE 0.9 % IV SOLN
510.0000 mg | Freq: Once | INTRAVENOUS | Status: AC
  Administered 2023-12-03: 510 mg via INTRAVENOUS
  Filled 2023-12-03: qty 510

## 2023-12-03 MED ORDER — ACETAMINOPHEN 325 MG PO TABS
650.0000 mg | ORAL_TABLET | Freq: Once | ORAL | Status: AC
  Administered 2023-12-03: 650 mg via ORAL
  Filled 2023-12-03: qty 2

## 2023-12-03 MED ORDER — SODIUM CHLORIDE 0.9 % IV SOLN: INTRAVENOUS | Status: DC

## 2023-12-03 NOTE — Patient Instructions (Signed)

## 2023-12-03 NOTE — Progress Notes (Signed)
 Patient presents today for Feraheme infusion per providers order.  Vital signs WNL.  Patient has no new complaints at this time.  Peripheral IV started and blood return noted pre and post infusion.  Stable during infusion without adverse affects.  Vital signs stable.  No complaints at this time.  Discharge from clinic ambulatory in stable condition.  Alert and oriented X 3.  Follow up with [facility] as scheduled.

## 2024-01-26 ENCOUNTER — Encounter: Payer: MEDICAID | Admitting: Obstetrics & Gynecology

## 2024-02-16 ENCOUNTER — Encounter: Payer: MEDICAID | Admitting: Obstetrics & Gynecology

## 2024-02-19 ENCOUNTER — Inpatient Hospital Stay: Payer: MEDICAID | Attending: Oncology

## 2024-02-27 ENCOUNTER — Inpatient Hospital Stay: Payer: MEDICAID | Admitting: Oncology

## 2024-03-12 ENCOUNTER — Encounter: Payer: MEDICAID | Admitting: Obstetrics and Gynecology

## 2024-06-29 ENCOUNTER — Ambulatory Visit
Admission: EM | Admit: 2024-06-29 | Discharge: 2024-06-29 | Disposition: A | Discharge status: Home / Self Care | Payer: MEDICAID

## 2024-06-29 ENCOUNTER — Other Ambulatory Visit: Payer: Self-pay

## 2024-06-29 DIAGNOSIS — M545 Low back pain, unspecified: Secondary | ICD-10-CM | POA: Diagnosis not present

## 2024-06-29 DIAGNOSIS — M546 Pain in thoracic spine: Secondary | ICD-10-CM

## 2024-06-29 DIAGNOSIS — R319 Hematuria, unspecified: Secondary | ICD-10-CM | POA: Diagnosis not present

## 2024-06-29 LAB — POCT URINE DIPSTICK
Bilirubin, UA: NEGATIVE
Glucose, UA: NEGATIVE mg/dL
Ketones, POC UA: NEGATIVE mg/dL
Leukocytes, UA: NEGATIVE
Nitrite, UA: NEGATIVE
POC PROTEIN,UA: NEGATIVE
Spec Grav, UA: 1.01 (ref 1.010–1.025)
Urobilinogen, UA: 0.2 U/dL
pH, UA: 6 (ref 5.0–8.0)

## 2024-06-29 MED ORDER — TIZANIDINE HCL 4 MG PO TABS: 4.0000 mg | ORAL_TABLET | Freq: Three times a day (TID) | ORAL | 0 refills | Status: AC | PRN

## 2024-06-29 MED ORDER — KETOROLAC TROMETHAMINE 30 MG/ML IJ SOLN
30.0000 mg | Freq: Once | INTRAMUSCULAR | Status: AC
  Administered 2024-06-29: 30 mg via INTRAMUSCULAR

## 2024-06-29 NOTE — ED Provider Notes (Signed)
 [facility]    CSN: [id number] Arrival date & time: 06/29/24  1018      History   Chief Complaint No chief complaint on file.   HPI Sophia Garcia is a 38 y.o. female.   Patient presents with acute left sided thoracic and low back pain that began this morning when she woke up.  She denies recent fall, injury, or trauma to her back.  She describes the pain as a tightness that is worse with any movement.  No shortness of breath, chest pain, nausea, vomiting, or diaphoresis.  Reports yesterday at work she was cleaning partitions at work and wonders if she may have overdone it or pulled a muscle.  She denies any popping or pulling sound or sensation while at work yesterday and was not having any pain when she went to bed last night.  Also wonders if she may have slept funny last night.  Reports history of chronic midline mid back pain that does not feel similar to the current pain.  No new lower or upper extremity weakness or numbness/tingling.  No new urinary symptoms.     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   Erroneous encounter - disregard 08/03/2012   Anemia 06/14/2012   Status post repeat low transverse cesarean section/BTL 06/13/2012   Tobacco abuse 12/23/2011   Substance abuse complicating pregnancy, antepartum (HCC) 12/23/2011    Past Surgical History:  Procedure Laterality Date   CESAREAN SECTION     CESAREAN SECTION  06/12/2012   Procedure: CESAREAN SECTION;  Surgeon: Rome Rigg, MD;  Location: [facility];  Service: Obstetrics;  Laterality: N/A;  Repeat   CHOLECYSTECTOMY N/A 01/03/2014   Procedure: LAPAROSCOPIC CHOLECYSTECTOMY;  Surgeon: Oneil DELENA Budge, MD;  Location: [facility];  Service: General;  Laterality: N/A;   FOOT SURGERY  2002   STEPPED ON SEWING NEEDLE   TOOTH EXTRACTION  2009   ONE TOOTH   TUBAL LIGATION      OB History     Gravida  2   Para  2   Term  2   Preterm      AB      Living  2      SAB      IAB      Ectopic      Multiple      Live Births  2            Home Medications    Prior to Admission medications   Medication Sig Start Date End Date Taking? Authorizing Provider  celecoxib (CELEBREX) 200 MG capsule Take 200 mg by mouth daily. 06/13/24  Yes [provider]  cyclobenzaprine  (FLEXERIL ) 10 MG tablet Take 10 mg by mouth 3 (three) times daily as needed. 05/17/24  Yes [provider]  tiZANidine (ZANAFLEX) 4 MG tablet Take 4 mg by mouth at bedtime. 06/27/24  Yes [provider]  tiZANidine (ZANAFLEX) 4 MG tablet Take 1 tablet (4 mg total) by mouth every  8 (eight) hours as needed for muscle spasms. Do not take with alcohol or while driving or operating heavy machinery.  May cause drowsiness. 06/29/24  Yes Chandra Harlene LABOR, NP  aspirin  81 MG EC tablet Take 1 tablet (81 mg total) by mouth daily. (May buy from over the counter)Swallow whole: For heart health. 11/01/19   Collene Gouge I, NP  busPIRone  (BUSPAR ) 10 MG tablet Take 1 tablet (10 mg total) by mouth 3 (three) times daily. For anxiety 10/31/19   Collene Gouge I, NP  DULoxetine HCl 40 MG CPEP Take by mouth. 11/12/23   [provider]  ferrous sulfate  324 MG TBEC Take 324 mg by mouth daily with breakfast. 09/04/23   [provider]  FLUoxetine  (PROZAC ) 40 MG capsule Take 1 capsule (40 mg total) by mouth daily. For depression 11/01/19   Collene Gouge I, NP  hydrOXYzine  (ATARAX /VISTARIL ) 25 MG tablet Take 1 tablet (25 mg total) by  mouth every 6 (six) hours as needed for itching. 10/31/19   Collene Gouge I, NP  nicotine  (NICODERM CQ  - DOSED IN MG/24 HOURS) 14 mg/24hr patch Place 1 patch (14 mg total) onto the skin daily. (May buy from over the counter): For smoking cessation 11/01/19   Collene Gouge I, NP  pantoprazole  (PROTONIX ) 40 MG tablet Take 1 tablet (40 mg total) by mouth daily. For acid reflux 11/01/19   Collene Gouge I, NP  QUEtiapine  (SEROQUEL ) 25 MG tablet Take 25 mg by mouth at bedtime. 08/25/23   [provider]  valACYclovir  (VALTREX ) 1000 MG tablet Take 1 tablet (1,000 mg total) by mouth daily. Patient taking differently: Take 1,000 mg by mouth daily. May take 1,000mg  twice daily as needed for outbreak. 03/04/19   Malinda Rogue, MD  Vitamin D, Ergocalciferol, (DRISDOL) 1.25 MG (50000 UNIT) CAPS capsule Take 50,000 Units by mouth once a week. 08/28/23   [provider]  omeprazole  (PRILOSEC) 20 MG capsule Take 1 po BID x 2 weeks then once a day 06/23/18 02/03/19  Randol Albany, MD    Family History Family History  Problem Relation Age of Onset   Diabetes Father        ORAL MEDS   Cancer Paternal Aunt        BREAST;LIVER   Diabetes Paternal Aunt    Diabetes Paternal Uncle    COPD Paternal Uncle    Cancer Maternal Grandmother        BREAST   COPD Mother        EMPHYSEMA   Diabetes Paternal Grandfather    Other Neg Hx     Social History Social History   Tobacco Use   Smoking status: Every Day    Current packs/day: 0.25    Average packs/day: 0.3 packs/day for 12.0 years (3.0 ttl pk-yrs)    Types: Cigarettes   Smokeless tobacco: Never   Tobacco comments:    1 pack lasts 4 days  Vaping Use   Vaping status: Never Used  Substance Use Topics   Alcohol use: Yes    Comment: occasionally   Drug use: Not Currently    Types: Marijuana     Allergies   Latex, Baby oil, and Morphine  and codeine   Review of Systems Review of Systems Per HPI  Physical Exam Triage Vital Signs ED Triage  Vitals  Encounter Vitals Group     BP 06/29/24 1028 115/74     Girls Systolic BP Percentile --      Girls Diastolic BP Percentile --  Boys Systolic BP Percentile --      Boys Diastolic BP Percentile --      Pulse Rate 06/29/24 1028 (!) 103     Resp 06/29/24 1028 20     Temp 06/29/24 1028 98.2 F (36.8 C)     Temp Source 06/29/24 1028 Oral     SpO2 06/29/24 1028 98 %     Weight --      Height --      Head Circumference --      Peak Flow --      Pain Score 06/29/24 1029 8     Pain Loc --      Pain Education --      Exclude from Growth Chart --    No data found.  Updated Vital Signs BP 115/74 (BP Location: Right Arm)   Pulse (!) 103   Temp 98.2 F (36.8 C) (Oral)   Resp 20   LMP 06/22/2024 (Approximate)   SpO2 98%   Visual Acuity Right Eye Distance:   Left Eye Distance:   Bilateral Distance:    Right Eye Near:   Left Eye Near:    Bilateral Near:     Physical Exam Vitals and nursing note reviewed.  Constitutional:      General: She is not in acute distress.    Appearance: Normal appearance. She is not toxic-appearing.  HENT:     Mouth/Throat:     Mouth: Mucous membranes are moist.     Pharynx: Oropharynx is clear.  Pulmonary:     Effort: Pulmonary effort is normal. No respiratory distress.  Abdominal:     Tenderness: There is left CVA tenderness. There is no right CVA tenderness.  Musculoskeletal:       Arms:     Comments: Pain to left thoracic back in area marked; no bruising, swelling, obvious deformity, redness.  Patient has full range of motion and normal strength/sensation to bilateral upper and lower extremities.  She is neurovascularly intact in distal bilateral upper and lower extremities.  Skin:    General: Skin is warm and dry.     Capillary Refill: Capillary refill takes less than 2 seconds.     Coloration: Skin is not jaundiced or pale.     Findings: No erythema.  Neurological:     Mental Status: She is alert and oriented to person, place,  and time.  Psychiatric:        Behavior: Behavior is cooperative.      UC Treatments / Results  Labs (all labs ordered are listed, but only abnormal results are displayed) Labs Reviewed  POCT URINE DIPSTICK - Abnormal; Notable for the following components:      Result Value   Blood, UA trace-intact (*)    All other components within normal limits    EKG   Radiology No results found.  Procedures Procedures (including critical care time)  Medications Ordered in UC Medications  ketorolac  (TORADOL ) 30 MG/ML injection 30 mg (30 mg Intramuscular Given 06/29/24 1136)    Initial Impression / Assessment and Plan / UC Course  I have reviewed the triage vital signs and the nursing notes.  Pertinent labs & imaging results that were available during my care of the patient were reviewed by me and considered in my medical decision making (see chart for details).   Patient is well-appearing, normotensive, afebrile, not tachycardic, not tachypneic, oxygenating well on room air.  1. Acute left-sided low back pain without sciatica 2. Hematuria, unspecified type 3.  Acute left-sided thoracic back pain Vitals and exam are reassuring today  EKG shows normal sinus rhythm without significant ST segment or T wave abnormality; no prior comparison; low suspicion for ACS today Symptoms are consistent with muscle strain and muscle spasm UA today shows trace blood; we discussed possible kidney stone but not likely cause of pain today; we briefly discussed treatment and ER precautions Will treat for muscular pain with Toradol  30 mg IM today in urgent care, start muscle relaxant as needed and avoid NSAIDs for 48 hours Additionally start Tylenol  and warm compresses and light range of motion/stretching ER precautions discussed Work excuse provided   The patient was given the opportunity to ask questions.  All questions answered to their satisfaction.  The patient is in agreement to this plan. Final  Clinical Impressions(s) / UC Diagnoses   Final diagnoses:  Acute left-sided low back pain without sciatica  Hematuria, unspecified type  Acute left-sided thoracic back pain     Discharge Instructions      Your symptoms are consistent with a muscle strain of your mid back.  We gave you an injection of an anti-inflammatory medicine today to help with pain and inflammation.  Please avoid all NSAIDs for 48 hours, you can take Tylenol  500 to 1000 mg every 6 hours for pain.  After 48 hours, you can begin the anti-inflammatory you have at home on a regular basis to help with pain.  You can also use a muscle relaxant every 8 hours as needed for muscular pain.  Also recommend light range of motion, stretching exercises and warm compresses to help with muscular pain.   The urine sample today shows a very small amount of blood which may be related to a kidney stone.  I do not believe the pain you are having is coming from a kidney stone today.  If you develop severe pain, nausea/vomiting and are unable to keep fluids down, please seek care emergently.    ED Prescriptions     Medication Sig Dispense Auth. Provider   tiZANidine (ZANAFLEX) 4 MG tablet Take 1 tablet (4 mg total) by mouth every 8 (eight) hours as needed for muscle spasms. Do not take with alcohol or while driving or operating heavy machinery.  May cause drowsiness. 30 tablet Chandra Harlene LABOR, NP      PDMP not reviewed this encounter.   Chandra Harlene LABOR, NP 06/29/24 1415

## 2024-06-29 NOTE — ED Triage Notes (Signed)
 Pt reports she has upper back pain on left side that goes to low back since 5:30 am this morning. Pain increases with movement  States it feels like pressure  Denies injury or chest pain

## 2024-06-29 NOTE — Discharge Instructions (Addendum)
 Your symptoms are consistent with a muscle strain of your mid back.  We gave you an injection of an anti-inflammatory medicine today to help with pain and inflammation.  Please avoid all NSAIDs for 48 hours, you can take Tylenol  500 to 1000 mg every 6 hours for pain.  After 48 hours, you can begin the anti-inflammatory you have at home on a regular basis to help with pain.  You can also use a muscle relaxant every 8 hours as needed for muscular pain.  Also recommend light range of motion, stretching exercises and warm compresses to help with muscular pain.   The urine sample today shows a very small amount of blood which may be related to a kidney stone.  I do not believe the pain you are having is coming from a kidney stone today.  If you develop severe pain, nausea/vomiting and are unable to keep fluids down, please seek care emergently.

## 2024-07-02 ENCOUNTER — Ambulatory Visit
Admission: EM | Admit: 2024-07-02 | Discharge: 2024-07-02 | Disposition: A | Discharge status: Home / Self Care | Payer: MEDICAID | Attending: Family Medicine | Admitting: Family Medicine

## 2024-07-02 DIAGNOSIS — R197 Diarrhea, unspecified: Secondary | ICD-10-CM

## 2024-07-02 DIAGNOSIS — R10A1 Flank pain, right side: Secondary | ICD-10-CM

## 2024-07-02 DIAGNOSIS — R112 Nausea with vomiting, unspecified: Secondary | ICD-10-CM

## 2024-07-02 LAB — POCT URINE DIPSTICK
Bilirubin, UA: NEGATIVE
Blood, UA: NEGATIVE
Glucose, UA: NEGATIVE mg/dL
Ketones, POC UA: NEGATIVE mg/dL
Leukocytes, UA: NEGATIVE
Nitrite, UA: NEGATIVE
POC PROTEIN,UA: NEGATIVE
Spec Grav, UA: 1.01 (ref 1.010–1.025)
Urobilinogen, UA: 0.2 U/dL
pH, UA: 6.5 (ref 5.0–8.0)

## 2024-07-02 MED ORDER — ONDANSETRON 4 MG PO TBDP: 4.0000 mg | ORAL_TABLET | Freq: Three times a day (TID) | ORAL | 0 refills | Status: AC | PRN

## 2024-07-02 MED ORDER — ONDANSETRON 4 MG PO TBDP
4.0000 mg | ORAL_TABLET | Freq: Once | ORAL | Status: AC
  Administered 2024-07-02: 4 mg via ORAL

## 2024-07-02 NOTE — ED Provider Notes (Signed)
 [facility]    CSN: [id number] Arrival date & time: 07/02/24  0846      History   Chief Complaint Chief Complaint  Patient presents with   Abdominal Pain    HPI Sophia Garcia is a 38 y.o. female.   Patient presenting today with new onset nausea, vomiting, diarrhea, right flank/lower abdominal pain starting around 3 AM this morning.  She states it feels identical to her prior issues with rupturing ovarian cysts.  The pain has eased off significantly but still having nausea, diarrhea.  Denies fever, chills, new foods or medications, upper respiratory symptoms, recent sick contacts.  So far not trying anything over-the-counter for symptoms.    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   Erroneous encounter - disregard 08/03/2012   Anemia 06/14/2012   Status post repeat low transverse cesarean section/BTL 06/13/2012   Tobacco abuse 12/23/2011   Substance abuse complicating pregnancy, antepartum (HCC) 12/23/2011    Past Surgical History:  Procedure Laterality Date   CESAREAN SECTION     CESAREAN SECTION  06/12/2012   Procedure: CESAREAN SECTION;  Surgeon: Rome Rigg, MD;  Location: [facility];  Service: Obstetrics;  Laterality: N/A;  Repeat   CHOLECYSTECTOMY N/A 01/03/2014   Procedure: LAPAROSCOPIC CHOLECYSTECTOMY;  Surgeon: Oneil DELENA Budge, MD;  Location: [facility];  Service: General;  Laterality: N/A;   FOOT SURGERY  2002   STEPPED ON SEWING NEEDLE   TOOTH EXTRACTION  2009   ONE TOOTH    TUBAL LIGATION      OB History     Gravida  2   Para  2   Term  2   Preterm      AB      Living  2      SAB      IAB      Ectopic      Multiple      Live Births  2            Home Medications    Prior to Admission medications   Medication Sig Start Date End Date Taking? Authorizing Provider  ondansetron  (ZOFRAN -ODT) 4 MG disintegrating tablet Take 1 tablet (4 mg total) by mouth every 8 (eight) hours as needed for nausea or vomiting. 07/02/24  Yes Stuart Vernell Norris, PA-C  aspirin  81 MG EC tablet Take 1 tablet (81 mg total) by mouth daily. (May buy from over the counter)Swallow whole: For heart health. 11/01/19   Collene Gouge I, NP  busPIRone  (BUSPAR ) 10 MG tablet Take 1 tablet (10 mg total) by mouth 3 (three) times daily. For anxiety 10/31/19   Collene Gouge I, NP  celecoxib (CELEBREX) 200 MG capsule Take 200 mg by mouth daily. 06/13/24   [provider]  cyclobenzaprine  (FLEXERIL ) 10 MG tablet Take 10 mg by mouth 3 (three) times daily as needed. 05/17/24   [provider]  DULoxetine HCl 40 MG CPEP Take by mouth. 11/12/23   [provider]  ferrous sulfate  324  MG TBEC Take 324 mg by mouth daily with breakfast. 09/04/23   [provider]  FLUoxetine  (PROZAC ) 40 MG capsule Take 1 capsule (40 mg total) by mouth daily. For depression 11/01/19   Collene Gouge I, NP  hydrOXYzine  (ATARAX /VISTARIL ) 25 MG tablet Take 1 tablet (25 mg total) by mouth every 6 (six) hours as needed for itching. 10/31/19   Collene Gouge I, NP  nicotine  (NICODERM CQ  - DOSED IN MG/24 HOURS) 14 mg/24hr patch Place 1 patch (14 mg total) onto the skin daily. (May buy from over the counter): For smoking cessation 11/01/19   Collene Gouge I, NP  pantoprazole  (PROTONIX ) 40 MG tablet Take 1 tablet (40 mg total) by mouth daily. For acid reflux 11/01/19   Collene Gouge I, NP  QUEtiapine  (SEROQUEL ) 25 MG tablet Take 25 mg by mouth at bedtime. 08/25/23   [provider]   tiZANidine (ZANAFLEX) 4 MG tablet Take 4 mg by mouth at bedtime. 06/27/24   [provider]  tiZANidine (ZANAFLEX) 4 MG tablet Take 1 tablet (4 mg total) by mouth every 8 (eight) hours as needed for muscle spasms. Do not take with alcohol or while driving or operating heavy machinery.  May cause drowsiness. 06/29/24   Chandra Harlene LABOR, NP  valACYclovir  (VALTREX ) 1000 MG tablet Take 1 tablet (1,000 mg total) by mouth daily. Patient taking differently: Take 1,000 mg by mouth daily. May take 1,000mg  twice daily as needed for outbreak. 03/04/19   Malinda Rogue, MD  Vitamin D, Ergocalciferol, (DRISDOL) 1.25 MG (50000 UNIT) CAPS capsule Take 50,000 Units by mouth once a week. 08/28/23   [provider]  omeprazole  (PRILOSEC) 20 MG capsule Take 1 po BID x 2 weeks then once a day 06/23/18 02/03/19  Randol Albany, MD    Family History Family History  Problem Relation Age of Onset   Diabetes Father        ORAL MEDS   Cancer Paternal Aunt        BREAST;LIVER   Diabetes Paternal Aunt    Diabetes Paternal Uncle    COPD Paternal Uncle    Cancer Maternal Grandmother        BREAST   COPD Mother        EMPHYSEMA   Diabetes Paternal Grandfather    Other Neg Hx     Social History Social History   Tobacco Use   Smoking status: Every Day    Current packs/day: 0.25    Average packs/day: 0.3 packs/day for 12.0 years (3.0 ttl pk-yrs)    Types: Cigarettes   Smokeless tobacco: Never   Tobacco comments:    1 pack lasts 4 days  Vaping Use   Vaping status: Never Used  Substance Use Topics   Alcohol use: Yes    Comment: occasionally   Drug use: Not Currently    Types: Marijuana     Allergies   Latex, Baby oil, and Morphine  and codeine   Review of Systems Review of Systems PER HPI  Physical Exam Triage Vital Signs ED Triage Vitals  Encounter Vitals Group     BP 07/02/24 0856 105/73     Girls Systolic BP Percentile --      Girls Diastolic BP Percentile --      Boys Systolic  BP Percentile --      Boys Diastolic BP Percentile --      Pulse Rate 07/02/24 0856 92     Resp 07/02/24 0856 16     Temp 07/02/24 0856  97.9 F (36.6 C)     Temp Source 07/02/24 0856 Oral     SpO2 07/02/24 0856 97 %     Weight --      Height --      Head Circumference --      Peak Flow --      Pain Score 07/02/24 0854 6     Pain Loc --      Pain Education --      Exclude from Growth Chart --    No data found.  Updated Vital Signs BP 105/73 (BP Location: Right Arm)   Pulse 92   Temp 97.9 F (36.6 C) (Oral)   Resp 16   LMP 06/22/2024 (Approximate)   SpO2 97%   Visual Acuity Right Eye Distance:   Left Eye Distance:   Bilateral Distance:    Right Eye Near:   Left Eye Near:    Bilateral Near:     Physical Exam Vitals and nursing note reviewed.  Constitutional:      Appearance: Normal appearance.  HENT:     Head: Atraumatic.     Mouth/Throat:     Mouth: Mucous membranes are moist.  Eyes:     Extraocular Movements: Extraocular movements intact.     Conjunctiva/sclera: Conjunctivae normal.  Cardiovascular:     Rate and Rhythm: Normal rate.  Pulmonary:     Effort: Pulmonary effort is normal.  Abdominal:     General: Bowel sounds are normal. There is no distension.     Palpations: Abdomen is soft.     Tenderness: There is abdominal tenderness. There is no right CVA tenderness, left CVA tenderness or guarding.     Comments: Right lower abdominal tenderness to palpation and suprapubic tenderness to palpation without distention or guarding.  Negative McBurney's, negative Rovsing's  Musculoskeletal:        General: Normal range of motion.     Cervical back: Normal range of motion and neck supple.  Skin:    General: Skin is warm and dry.  Neurological:     Mental Status: She is alert and oriented to person, place, and time.  Psychiatric:        Mood and Affect: Mood normal.        Thought Content: Thought content normal.      UC Treatments / Results   Labs (all labs ordered are listed, but only abnormal results are displayed) Labs Reviewed  POCT URINE DIPSTICK - Abnormal; Notable for the following components:      Result Value   Color, UA light yellow (*)    All other components within normal limits    EKG   Radiology No results found.  Procedures Procedures (including critical care time)  Medications Ordered in UC Medications  ondansetron  (ZOFRAN -ODT) disintegrating tablet 4 mg (4 mg Oral Given 07/02/24 0918)    Initial Impression / Assessment and Plan / UC Course  I have reviewed the triage vital signs and the nursing notes.  Pertinent labs & imaging results that were available during my care of the patient were reviewed by me and considered in my medical decision making (see chart for details).     Possibly viral GI illness versus ruptured ovarian cyst as she is suspecting given her history and how they have felt previously.  Discussed with patient that we are unable to confirm anything like this without availability of ultrasound or CT so we will treat symptomatically with Zofran , BRAT diet, fluids and provide work note.  Follow-up with OB/GYN for recheck, ED for worsening symptoms.  Final Clinical Impressions(s) / UC Diagnoses   Final diagnoses:  Nausea vomiting and diarrhea  Right flank pain     Discharge Instructions      Follow-up for severe worsening symptoms in the emergency department.    ED Prescriptions     Medication Sig Dispense Auth. Provider   ondansetron  (ZOFRAN -ODT) 4 MG disintegrating tablet Take 1 tablet (4 mg total) by mouth every 8 (eight) hours as needed for nausea or vomiting. 20 tablet Stuart Vernell Norris, [place]      PDMP not reviewed this encounter.   Stuart Vernell Norris, [place] 07/02/24 1103

## 2024-07-02 NOTE — Discharge Instructions (Signed)
 Follow-up for severe worsening symptoms in the emergency department.

## 2024-07-02 NOTE — ED Triage Notes (Signed)
 Pt states abdominal pain and bloating.  States it woke her out of sleep at 0300. States she had vomiting and diarrhea. Abdomen distended soft.

## 2024-07-31 ENCOUNTER — Ambulatory Visit
Admission: EM | Admit: 2024-07-31 | Discharge: 2024-07-31 | Disposition: A | Discharge status: Home / Self Care | Payer: MEDICAID | Attending: Family Medicine | Admitting: Family Medicine

## 2024-07-31 DIAGNOSIS — J069 Acute upper respiratory infection, unspecified: Secondary | ICD-10-CM | POA: Diagnosis not present

## 2024-07-31 LAB — POC COVID19/FLU A&B COMBO
Covid Antigen, POC: NEGATIVE
Influenza A Antigen, POC: NEGATIVE
Influenza B Antigen, POC: NEGATIVE

## 2024-07-31 MED ORDER — BENZONATATE 200 MG PO CAPS: 200.0000 mg | ORAL_CAPSULE | Freq: Three times a day (TID) | ORAL | 0 refills | Status: AC | PRN

## 2024-07-31 MED ORDER — AZELASTINE HCL 0.1 % NA SOLN: 1.0000 | Freq: Two times a day (BID) | NASAL | 0 refills | Status: AC

## 2024-07-31 NOTE — ED Triage Notes (Signed)
 Pt states runny nose and cough since yesterday.  States she has cold chills today.  Denies taking anything for her symptoms.

## 2024-08-01 NOTE — ED Provider Notes (Signed)
 [facility]    CSN: [id number] Arrival date & time: 07/31/24  1322      History   Chief Complaint Chief Complaint  Patient presents with   Nasal Congestion    HPI Sophia Garcia is a 38 y.o. female.   Patient presenting today with nasal congestion, runny nose, cough x 1 day.  Now having chills and fatigue additionally.  Denies fever, chest pain, shortness of breath, abdominal pain, vomiting, diarrhea.  So far taking over-the-counter remedies with minimal relief.    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   Erroneous encounter - disregard 08/03/2012   Anemia 06/14/2012   Status post repeat low transverse cesarean section/BTL 06/13/2012   Tobacco abuse 12/23/2011   Substance abuse complicating pregnancy, antepartum (HCC) 12/23/2011    Past Surgical History:  Procedure Laterality Date   CESAREAN SECTION     CESAREAN SECTION  06/12/2012   Procedure: CESAREAN SECTION;  Surgeon: Rome Rigg, MD;  Location: [facility];  Service: Obstetrics;  Laterality: N/A;  Repeat   CHOLECYSTECTOMY N/A 01/03/2014   Procedure: LAPAROSCOPIC CHOLECYSTECTOMY;  Surgeon: Oneil DELENA Budge, MD;  Location: [facility];  Service: General;  Laterality: N/A;   FOOT SURGERY  2002   STEPPED ON SEWING NEEDLE   TOOTH EXTRACTION  2009   ONE TOOTH   TUBAL LIGATION      OB History     Gravida  2   Para  2   Term  2   Preterm      AB      Living  2      SAB      IAB      Ectopic      Multiple      Live  Births  2            Home Medications    Prior to Admission medications  Medication Sig Start Date End Date Taking? Authorizing Provider  azelastine  (ASTELIN ) 0.1 % nasal spray Place 1 spray into both nostrils 2 (two) times daily. Use in each nostril as directed 07/31/24  Yes Stuart Vernell Norris, PA-C  benzonatate  (TESSALON ) 200 MG capsule Take 1 capsule (200 mg total) by mouth 3 (three) times daily as needed for cough. 07/31/24  Yes Stuart Vernell Norris, PA-C  aspirin  81 MG EC tablet Take 1 tablet (81 mg total) by mouth daily. (May buy from over the counter)Swallow whole: For heart health. 11/01/19   Collene Gouge I, NP  busPIRone  (BUSPAR ) 10 MG tablet Take 1 tablet (10 mg total) by mouth 3 (three) times daily. For anxiety 10/31/19   Collene Gouge I, NP  celecoxib (CELEBREX) 200 MG capsule Take 200 mg by mouth daily. 06/13/24   [provider]  cyclobenzaprine  (FLEXERIL ) 10 MG tablet Take 10 mg by mouth 3 (three) times daily as needed. 05/17/24   [provider]  DULoxetine HCl 40 MG CPEP Take by mouth. 11/12/23   [provider]  ferrous sulfate  324 MG TBEC  Take 324 mg by mouth daily with breakfast. 09/04/23   [provider]  FLUoxetine  (PROZAC ) 40 MG capsule Take 1 capsule (40 mg total) by mouth daily. For depression 11/01/19   Collene Gouge I, NP  hydrOXYzine  (ATARAX /VISTARIL ) 25 MG tablet Take 1 tablet (25 mg total) by mouth every 6 (six) hours as needed for itching. 10/31/19   Collene Gouge I, NP  nicotine  (NICODERM CQ  - DOSED IN MG/24 HOURS) 14 mg/24hr patch Place 1 patch (14 mg total) onto the skin daily. (May buy from over the counter): For smoking cessation 11/01/19   Collene Gouge I, NP  ondansetron  (ZOFRAN -ODT) 4 MG disintegrating tablet Take 1 tablet (4 mg total) by mouth every 8 (eight) hours as needed for nausea or vomiting. 07/02/24   Stuart Vernell Norris, PA-C  pantoprazole  (PROTONIX ) 40 MG tablet Take 1 tablet (40 mg total) by mouth daily. For  acid reflux 11/01/19   Collene Gouge I, NP  QUEtiapine  (SEROQUEL ) 25 MG tablet Take 25 mg by mouth at bedtime. 08/25/23   [provider]  tiZANidine  (ZANAFLEX ) 4 MG tablet Take 4 mg by mouth at bedtime. 06/27/24   [provider]  tiZANidine  (ZANAFLEX ) 4 MG tablet Take 1 tablet (4 mg total) by mouth every 8 (eight) hours as needed for muscle spasms. Do not take with alcohol or while driving or operating heavy machinery.  May cause drowsiness. 06/29/24   Chandra Harlene LABOR, NP  valACYclovir  (VALTREX ) 1000 MG tablet Take 1 tablet (1,000 mg total) by mouth daily. Patient taking differently: Take 1,000 mg by mouth daily. May take 1,000mg  twice daily as needed for outbreak. 03/04/19   Malinda Rogue, MD  Vitamin D, Ergocalciferol, (DRISDOL) 1.25 MG (50000 UNIT) CAPS capsule Take 50,000 Units by mouth once a week. 08/28/23   [provider]  omeprazole  (PRILOSEC) 20 MG capsule Take 1 po BID x 2 weeks then once a day 06/23/18 02/03/19  Randol Albany, MD    Family History Family History  Problem Relation Age of Onset   Diabetes Father        ORAL MEDS   Cancer Paternal Aunt        BREAST;LIVER   Diabetes Paternal Aunt    Diabetes Paternal Uncle    COPD Paternal Uncle    Cancer Maternal Grandmother        BREAST   COPD Mother        EMPHYSEMA   Diabetes Paternal Grandfather    Other Neg Hx     Social History Social History[1]   Allergies   Latex, Baby oil, and Morphine  and codeine   Review of Systems Review of Systems Per HPI  Physical Exam Triage Vital Signs ED Triage Vitals  Encounter Vitals Group     BP 07/31/24 1329 (!) 151/94     Girls Systolic BP Percentile --      Girls Diastolic BP Percentile --      Boys Systolic BP Percentile --      Boys Diastolic BP Percentile --      Pulse Rate 07/31/24 1329 (!) 108     Resp 07/31/24 1329 16     Temp 07/31/24 1329 98.1 F (36.7 C)     Temp Source 07/31/24 1329 Oral     SpO2 07/31/24 1329 96 %     Weight --       Height --      Head Circumference --      Peak Flow --  Pain Score 07/31/24 1328 0     Pain Loc --      Pain Education --      Exclude from Growth Chart --    No data found.  Updated Vital Signs BP (!) 151/94 (BP Location: Right Arm)   Pulse (!) 108   Temp 98.1 F (36.7 C) (Oral)   Resp 16   LMP 07/08/2024 (Approximate)   SpO2 96%   Visual Acuity Right Eye Distance:   Left Eye Distance:   Bilateral Distance:    Right Eye Near:   Left Eye Near:    Bilateral Near:     Physical Exam Vitals and nursing note reviewed.  Constitutional:      Appearance: Normal appearance.  HENT:     Head: Atraumatic.     Right Ear: Tympanic membrane and external ear normal.     Left Ear: Tympanic membrane and external ear normal.     Nose: Rhinorrhea present.     Mouth/Throat:     Mouth: Mucous membranes are moist.     Pharynx: Posterior oropharyngeal erythema present.  Eyes:     Extraocular Movements: Extraocular movements intact.     Conjunctiva/sclera: Conjunctivae normal.  Cardiovascular:     Rate and Rhythm: Normal rate and regular rhythm.     Heart sounds: Normal heart sounds.  Pulmonary:     Effort: Pulmonary effort is normal.     Breath sounds: Normal breath sounds. No wheezing or rales.  Musculoskeletal:        General: Normal range of motion.     Cervical back: Normal range of motion and neck supple.  Skin:    General: Skin is warm and dry.  Neurological:     Mental Status: She is alert and oriented to person, place, and time.  Psychiatric:        Mood and Affect: Mood normal.        Thought Content: Thought content normal.      UC Treatments / Results  Labs (all labs ordered are listed, but only abnormal results are displayed) Labs Reviewed  POC COVID19/FLU A&B COMBO    EKG   Radiology No results found.  Procedures Procedures (including critical care time)  Medications Ordered in UC Medications - No data to display  Initial Impression /  Assessment and Plan / UC Course  I have reviewed the triage vital signs and the nursing notes.  Pertinent labs & imaging results that were available during my care of the patient were reviewed by me and considered in my medical decision making (see chart for details).     Mildly tachycardic and hypertensive in triage, otherwise vital signs within normal limits.  Rapid flu and COVID-negative.  Suspect viral respiratory infection.  Treat with Astelin , Tessalon , supportive over-the-counter medications and home care.  Return for worsening or unresolving symptoms.  Final Clinical Impressions(s) / UC Diagnoses   Final diagnoses:  Viral URI with cough   Discharge Instructions   None    ED Prescriptions     Medication Sig Dispense Auth. Provider   azelastine  (ASTELIN ) 0.1 % nasal spray Place 1 spray into both nostrils 2 (two) times daily. Use in each nostril as directed 30 mL Stuart Vernell Norris, PA-C   benzonatate  (TESSALON ) 200 MG capsule Take 1 capsule (200 mg total) by mouth 3 (three) times daily as needed for cough. 20 capsule Stuart Vernell Norris, [place]      PDMP not reviewed this encounter.    [1]  Social History Tobacco Use   Smoking status: Every Day    Current packs/day: 0.25    Average packs/day: 0.3 packs/day for 12.0 years (3.0 ttl pk-yrs)    Types: Cigarettes   Smokeless tobacco: Never   Tobacco comments:    1 pack lasts 4 days  Vaping Use   Vaping status: Never Used  Substance Use Topics   Alcohol use: Yes    Comment: occasionally   Drug use: Not Currently    Types: Marijuana     Stuart Vernell Norris, PA-C 08/01/24 1348
